# Patient Record
Sex: Female | Born: 1965
Health system: Southern US, Community
[De-identification: ages and names within clinical notes are randomized; demographics above are authoritative.]

## PROBLEM LIST (undated history)

## (undated) DIAGNOSIS — F419 Anxiety disorder, unspecified: Secondary | ICD-10-CM

## (undated) DIAGNOSIS — E0500 Thyrotoxicosis with diffuse goiter without thyrotoxic crisis or storm: Secondary | ICD-10-CM

## (undated) DIAGNOSIS — J8410 Pulmonary fibrosis, unspecified: Secondary | ICD-10-CM

## (undated) DIAGNOSIS — N921 Excessive and frequent menstruation with irregular cycle: Secondary | ICD-10-CM

## (undated) DIAGNOSIS — F32A Depression, unspecified: Secondary | ICD-10-CM

## (undated) DIAGNOSIS — M1990 Unspecified osteoarthritis, unspecified site: Secondary | ICD-10-CM

## (undated) DIAGNOSIS — G43909 Migraine, unspecified, not intractable, without status migrainosus: Secondary | ICD-10-CM

## (undated) DIAGNOSIS — J189 Pneumonia, unspecified organism: Secondary | ICD-10-CM

## (undated) DIAGNOSIS — F329 Major depressive disorder, single episode, unspecified: Secondary | ICD-10-CM

## (undated) DIAGNOSIS — Z923 Personal history of irradiation: Secondary | ICD-10-CM

## (undated) HISTORY — PX: TUBAL LIGATION: SHX77

## (undated) HISTORY — DX: Depression, unspecified: F32.A

## (undated) HISTORY — DX: Pulmonary fibrosis, unspecified: J84.10

## (undated) HISTORY — DX: Migraine, unspecified, not intractable, without status migrainosus: G43.909

## (undated) HISTORY — DX: Excessive and frequent menstruation with irregular cycle: N92.1

## (undated) HISTORY — DX: Anxiety disorder, unspecified: F41.9

## (undated) HISTORY — DX: Thyrotoxicosis with diffuse goiter without thyrotoxic crisis or storm: E05.00

## (undated) HISTORY — DX: Major depressive disorder, single episode, unspecified: F32.9

## (undated) HISTORY — PX: ABDOMINAL HYSTERECTOMY: SHX81

---

## 1991-05-02 HISTORY — PX: OTHER SURGICAL HISTORY: SHX169

## 1997-11-26 ENCOUNTER — Emergency Department (HOSPITAL_COMMUNITY): Admission: EM | Admit: 1997-11-26 | Discharge: 1997-11-26 | Payer: Self-pay | Admitting: Emergency Medicine

## 1997-12-04 ENCOUNTER — Emergency Department (HOSPITAL_COMMUNITY): Admission: EM | Admit: 1997-12-04 | Discharge: 1997-12-04 | Payer: Self-pay | Admitting: Emergency Medicine

## 1998-02-20 ENCOUNTER — Encounter: Payer: Self-pay | Admitting: Emergency Medicine

## 1998-02-20 ENCOUNTER — Emergency Department (HOSPITAL_COMMUNITY): Admission: EM | Admit: 1998-02-20 | Discharge: 1998-02-20 | Payer: Self-pay | Admitting: Emergency Medicine

## 1998-09-18 ENCOUNTER — Encounter: Payer: Self-pay | Admitting: Emergency Medicine

## 1998-09-18 ENCOUNTER — Emergency Department (HOSPITAL_COMMUNITY): Admission: EM | Admit: 1998-09-18 | Discharge: 1998-09-18 | Payer: Self-pay | Admitting: Emergency Medicine

## 1998-09-21 ENCOUNTER — Encounter: Admission: RE | Admit: 1998-09-21 | Discharge: 1998-09-21 | Payer: Self-pay | Admitting: Sports Medicine

## 1998-10-01 ENCOUNTER — Encounter: Admission: RE | Admit: 1998-10-01 | Discharge: 1998-10-01 | Payer: Self-pay | Admitting: Family Medicine

## 1999-05-10 ENCOUNTER — Emergency Department (HOSPITAL_COMMUNITY): Admission: EM | Admit: 1999-05-10 | Discharge: 1999-05-11 | Payer: Self-pay | Admitting: Emergency Medicine

## 1999-12-21 ENCOUNTER — Encounter: Admission: RE | Admit: 1999-12-21 | Discharge: 1999-12-21 | Payer: Self-pay | Admitting: Family Medicine

## 2000-08-24 ENCOUNTER — Emergency Department (HOSPITAL_COMMUNITY): Admission: EM | Admit: 2000-08-24 | Discharge: 2000-08-25 | Payer: Self-pay | Admitting: Emergency Medicine

## 2000-12-05 ENCOUNTER — Inpatient Hospital Stay (HOSPITAL_COMMUNITY): Admission: AD | Admit: 2000-12-05 | Discharge: 2000-12-05 | Payer: Self-pay | Admitting: Obstetrics

## 2001-03-21 ENCOUNTER — Encounter: Admission: RE | Admit: 2001-03-21 | Discharge: 2001-03-21 | Payer: Self-pay | Admitting: Sports Medicine

## 2001-03-21 ENCOUNTER — Other Ambulatory Visit: Admission: RE | Admit: 2001-03-21 | Discharge: 2001-03-21 | Payer: Self-pay | Admitting: Family Medicine

## 2001-10-17 ENCOUNTER — Encounter: Admission: RE | Admit: 2001-10-17 | Discharge: 2001-10-17 | Payer: Self-pay | Admitting: Family Medicine

## 2001-10-21 ENCOUNTER — Encounter: Payer: Self-pay | Admitting: Sports Medicine

## 2001-10-21 ENCOUNTER — Encounter: Admission: RE | Admit: 2001-10-21 | Discharge: 2001-10-21 | Payer: Self-pay | Admitting: Sports Medicine

## 2001-10-30 ENCOUNTER — Encounter: Admission: RE | Admit: 2001-10-30 | Discharge: 2001-10-30 | Payer: Self-pay | Admitting: Family Medicine

## 2002-04-01 ENCOUNTER — Emergency Department (HOSPITAL_COMMUNITY): Admission: EM | Admit: 2002-04-01 | Discharge: 2002-04-01 | Payer: Self-pay | Admitting: Emergency Medicine

## 2002-04-02 ENCOUNTER — Encounter: Admission: RE | Admit: 2002-04-02 | Discharge: 2002-04-02 | Payer: Self-pay | Admitting: Family Medicine

## 2002-06-30 ENCOUNTER — Emergency Department (HOSPITAL_COMMUNITY): Admission: EM | Admit: 2002-06-30 | Discharge: 2002-07-01 | Payer: Self-pay | Admitting: Emergency Medicine

## 2002-11-11 ENCOUNTER — Encounter: Admission: RE | Admit: 2002-11-11 | Discharge: 2002-11-11 | Payer: Self-pay | Admitting: Family Medicine

## 2002-11-30 ENCOUNTER — Encounter (INDEPENDENT_AMBULATORY_CARE_PROVIDER_SITE_OTHER): Payer: Self-pay | Admitting: *Deleted

## 2002-11-30 LAB — CONVERTED CEMR LAB

## 2002-12-12 ENCOUNTER — Encounter: Admission: RE | Admit: 2002-12-12 | Discharge: 2002-12-12 | Payer: Self-pay | Admitting: Family Medicine

## 2002-12-17 ENCOUNTER — Other Ambulatory Visit: Admission: RE | Admit: 2002-12-17 | Discharge: 2002-12-29 | Payer: Self-pay | Admitting: Family Medicine

## 2002-12-17 ENCOUNTER — Encounter: Admission: RE | Admit: 2002-12-17 | Discharge: 2002-12-17 | Payer: Self-pay | Admitting: Family Medicine

## 2002-12-30 ENCOUNTER — Encounter (INDEPENDENT_AMBULATORY_CARE_PROVIDER_SITE_OTHER): Payer: Self-pay | Admitting: Specialist

## 2002-12-30 ENCOUNTER — Ambulatory Visit (HOSPITAL_BASED_OUTPATIENT_CLINIC_OR_DEPARTMENT_OTHER): Admission: RE | Admit: 2002-12-30 | Discharge: 2002-12-30 | Payer: Self-pay | Admitting: Orthopedic Surgery

## 2003-01-08 ENCOUNTER — Encounter: Payer: Self-pay | Admitting: Sports Medicine

## 2003-01-08 ENCOUNTER — Encounter: Admission: RE | Admit: 2003-01-08 | Discharge: 2003-01-08 | Payer: Self-pay | Admitting: Sports Medicine

## 2003-01-27 ENCOUNTER — Encounter: Admission: RE | Admit: 2003-01-27 | Discharge: 2003-01-27 | Payer: Self-pay | Admitting: Sports Medicine

## 2003-03-18 ENCOUNTER — Encounter: Admission: RE | Admit: 2003-03-18 | Discharge: 2003-03-18 | Payer: Self-pay | Admitting: Family Medicine

## 2003-04-01 ENCOUNTER — Encounter: Admission: RE | Admit: 2003-04-01 | Discharge: 2003-04-01 | Payer: Self-pay | Admitting: Family Medicine

## 2003-04-14 ENCOUNTER — Encounter: Admission: RE | Admit: 2003-04-14 | Discharge: 2003-04-14 | Payer: Self-pay | Admitting: Family Medicine

## 2003-05-22 ENCOUNTER — Encounter: Admission: RE | Admit: 2003-05-22 | Discharge: 2003-05-22 | Payer: Self-pay | Admitting: Family Medicine

## 2003-06-02 ENCOUNTER — Encounter: Admission: RE | Admit: 2003-06-02 | Discharge: 2003-06-02 | Payer: Self-pay | Admitting: Sports Medicine

## 2003-06-09 ENCOUNTER — Encounter: Admission: RE | Admit: 2003-06-09 | Discharge: 2003-06-09 | Payer: Self-pay | Admitting: Family Medicine

## 2003-06-17 ENCOUNTER — Encounter: Admission: RE | Admit: 2003-06-17 | Discharge: 2003-06-17 | Payer: Self-pay | Admitting: Sports Medicine

## 2003-07-08 ENCOUNTER — Encounter: Admission: RE | Admit: 2003-07-08 | Discharge: 2003-07-08 | Payer: Self-pay | Admitting: Family Medicine

## 2003-08-19 ENCOUNTER — Emergency Department (HOSPITAL_COMMUNITY): Admission: EM | Admit: 2003-08-19 | Discharge: 2003-08-19 | Payer: Self-pay | Admitting: Emergency Medicine

## 2003-09-23 ENCOUNTER — Inpatient Hospital Stay (HOSPITAL_COMMUNITY): Admission: AD | Admit: 2003-09-23 | Discharge: 2003-09-23 | Payer: Self-pay | Admitting: Obstetrics and Gynecology

## 2004-08-03 ENCOUNTER — Encounter: Admission: RE | Admit: 2004-08-03 | Discharge: 2004-08-03 | Payer: Self-pay | Admitting: Sports Medicine

## 2004-08-03 ENCOUNTER — Ambulatory Visit: Payer: Self-pay | Admitting: Family Medicine

## 2004-08-11 ENCOUNTER — Ambulatory Visit: Payer: Self-pay | Admitting: Sports Medicine

## 2004-08-25 ENCOUNTER — Encounter: Admission: RE | Admit: 2004-08-25 | Discharge: 2004-11-23 | Payer: Self-pay | Admitting: Sports Medicine

## 2004-10-20 ENCOUNTER — Ambulatory Visit: Payer: Self-pay | Admitting: Family Medicine

## 2004-10-20 ENCOUNTER — Other Ambulatory Visit: Admission: RE | Admit: 2004-10-20 | Discharge: 2004-10-20 | Payer: Self-pay | Admitting: Family Medicine

## 2004-10-28 ENCOUNTER — Ambulatory Visit: Payer: Self-pay | Admitting: Family Medicine

## 2004-11-08 ENCOUNTER — Encounter: Admission: RE | Admit: 2004-11-08 | Discharge: 2004-11-08 | Payer: Self-pay | Admitting: Sports Medicine

## 2005-12-13 ENCOUNTER — Ambulatory Visit: Payer: Self-pay | Admitting: Sports Medicine

## 2006-02-01 ENCOUNTER — Emergency Department (HOSPITAL_COMMUNITY): Admission: EM | Admit: 2006-02-01 | Discharge: 2006-02-01 | Payer: Self-pay | Admitting: Emergency Medicine

## 2006-03-02 ENCOUNTER — Emergency Department (HOSPITAL_COMMUNITY): Admission: EM | Admit: 2006-03-02 | Discharge: 2006-03-02 | Payer: Self-pay | Admitting: Emergency Medicine

## 2006-06-29 ENCOUNTER — Encounter (INDEPENDENT_AMBULATORY_CARE_PROVIDER_SITE_OTHER): Payer: Self-pay | Admitting: *Deleted

## 2006-11-07 ENCOUNTER — Emergency Department (HOSPITAL_COMMUNITY): Admission: EM | Admit: 2006-11-07 | Discharge: 2006-11-07 | Payer: Self-pay | Admitting: Emergency Medicine

## 2007-02-05 ENCOUNTER — Ambulatory Visit: Payer: Self-pay | Admitting: Family Medicine

## 2007-02-05 ENCOUNTER — Encounter (INDEPENDENT_AMBULATORY_CARE_PROVIDER_SITE_OTHER): Payer: Self-pay | Admitting: Family Medicine

## 2007-02-05 DIAGNOSIS — N800 Endometriosis of uterus: Secondary | ICD-10-CM

## 2007-02-05 DIAGNOSIS — F17200 Nicotine dependence, unspecified, uncomplicated: Secondary | ICD-10-CM

## 2007-02-05 DIAGNOSIS — F411 Generalized anxiety disorder: Secondary | ICD-10-CM

## 2007-02-05 LAB — CONVERTED CEMR LAB
Chlamydia, DNA Probe: NEGATIVE
GC Probe Amp, Genital: NEGATIVE
KOH Prep: NEGATIVE

## 2007-02-06 ENCOUNTER — Encounter: Payer: Self-pay | Admitting: *Deleted

## 2007-02-11 ENCOUNTER — Ambulatory Visit: Payer: Self-pay | Admitting: Family Medicine

## 2007-02-11 ENCOUNTER — Encounter (INDEPENDENT_AMBULATORY_CARE_PROVIDER_SITE_OTHER): Payer: Self-pay | Admitting: Family Medicine

## 2007-02-11 LAB — CONVERTED CEMR LAB
Ferritin: 12 ng/mL (ref 10–291)
Folate: 16 ng/mL
HCT: 37.5 % (ref 36.0–46.0)
Hemoglobin: 12.1 g/dL (ref 12.0–15.0)
LDL Cholesterol: 76 mg/dL (ref 0–99)
MCHC: 32.3 g/dL (ref 30.0–36.0)
RDW: 14.5 % — ABNORMAL HIGH (ref 11.5–14.0)
TSH: 1.199 microintl units/mL (ref 0.350–5.50)

## 2007-02-20 ENCOUNTER — Encounter: Payer: Self-pay | Admitting: *Deleted

## 2007-02-21 ENCOUNTER — Telehealth: Payer: Self-pay | Admitting: *Deleted

## 2007-02-21 ENCOUNTER — Encounter (INDEPENDENT_AMBULATORY_CARE_PROVIDER_SITE_OTHER): Payer: Self-pay | Admitting: Family Medicine

## 2007-02-22 ENCOUNTER — Telehealth: Payer: Self-pay | Admitting: *Deleted

## 2007-03-14 ENCOUNTER — Encounter (INDEPENDENT_AMBULATORY_CARE_PROVIDER_SITE_OTHER): Payer: Self-pay | Admitting: Family Medicine

## 2007-11-04 ENCOUNTER — Ambulatory Visit: Payer: Self-pay | Admitting: Family Medicine

## 2007-11-04 ENCOUNTER — Telehealth (INDEPENDENT_AMBULATORY_CARE_PROVIDER_SITE_OTHER): Payer: Self-pay | Admitting: *Deleted

## 2008-05-12 ENCOUNTER — Ambulatory Visit: Payer: Self-pay | Admitting: Family Medicine

## 2008-05-12 DIAGNOSIS — F329 Major depressive disorder, single episode, unspecified: Secondary | ICD-10-CM | POA: Insufficient documentation

## 2008-05-20 ENCOUNTER — Telehealth: Payer: Self-pay | Admitting: *Deleted

## 2008-09-08 ENCOUNTER — Inpatient Hospital Stay (HOSPITAL_COMMUNITY): Admission: EM | Admit: 2008-09-08 | Discharge: 2008-09-10 | Payer: Self-pay | Admitting: Emergency Medicine

## 2008-09-10 ENCOUNTER — Encounter (INDEPENDENT_AMBULATORY_CARE_PROVIDER_SITE_OTHER): Payer: Self-pay | Admitting: Pediatrics

## 2008-11-12 ENCOUNTER — Ambulatory Visit: Payer: Self-pay | Admitting: Family Medicine

## 2008-11-12 ENCOUNTER — Telehealth: Payer: Self-pay | Admitting: Family Medicine

## 2008-11-12 DIAGNOSIS — M75120 Complete rotator cuff tear or rupture of unspecified shoulder, not specified as traumatic: Secondary | ICD-10-CM | POA: Insufficient documentation

## 2008-11-12 DIAGNOSIS — G43909 Migraine, unspecified, not intractable, without status migrainosus: Secondary | ICD-10-CM | POA: Insufficient documentation

## 2009-12-29 ENCOUNTER — Ambulatory Visit: Payer: Self-pay | Admitting: Family Medicine

## 2009-12-30 ENCOUNTER — Encounter: Payer: Self-pay | Admitting: Sports Medicine

## 2009-12-30 ENCOUNTER — Ambulatory Visit (HOSPITAL_COMMUNITY): Admission: RE | Admit: 2009-12-30 | Discharge: 2009-12-30 | Payer: Self-pay | Admitting: Family Medicine

## 2009-12-30 ENCOUNTER — Ambulatory Visit: Payer: Self-pay | Admitting: Family Medicine

## 2009-12-31 ENCOUNTER — Ambulatory Visit: Payer: Self-pay | Admitting: Family Medicine

## 2010-02-09 ENCOUNTER — Emergency Department (HOSPITAL_COMMUNITY)
Admission: EM | Admit: 2010-02-09 | Discharge: 2010-02-09 | Payer: Self-pay | Source: Home / Self Care | Admitting: Family Medicine

## 2010-05-15 ENCOUNTER — Emergency Department (HOSPITAL_COMMUNITY)
Admission: EM | Admit: 2010-05-15 | Discharge: 2010-05-16 | Disposition: A | Discharge status: Other Acute Inpatient Hospital | Payer: Self-pay | Source: Home / Self Care | Admitting: Emergency Medicine

## 2010-05-16 ENCOUNTER — Encounter: Payer: Self-pay | Admitting: Family Medicine

## 2010-05-16 ENCOUNTER — Observation Stay (HOSPITAL_COMMUNITY)
Admission: EM | Admit: 2010-05-16 | Discharge: 2010-05-16 | Payer: Self-pay | Attending: Family Medicine | Admitting: Family Medicine

## 2010-05-16 LAB — BLOOD GAS, VENOUS
Acid-base deficit: 14.5 mmol/L — ABNORMAL HIGH (ref 0.0–2.0)
Bicarbonate: 13.7 mEq/L — ABNORMAL LOW (ref 20.0–24.0)
O2 Saturation: 94.1 %
Patient temperature: 98.6
TCO2: 13.3 mmol/L (ref 0–100)
pCO2, Ven: 42 mmHg — ABNORMAL LOW (ref 45.0–50.0)
pH, Ven: 7.142 — CL (ref 7.250–7.300)
pO2, Ven: 91.2 mmHg — ABNORMAL HIGH (ref 30.0–45.0)

## 2010-05-16 LAB — URINALYSIS, ROUTINE W REFLEX MICROSCOPIC
Bilirubin Urine: NEGATIVE
Hgb urine dipstick: NEGATIVE
Ketones, ur: NEGATIVE mg/dL
Nitrite: NEGATIVE
Protein, ur: NEGATIVE mg/dL
Specific Gravity, Urine: 1.02 (ref 1.005–1.030)
Urine Glucose, Fasting: NEGATIVE mg/dL
Urobilinogen, UA: 0.2 mg/dL (ref 0.0–1.0)
pH: 5 (ref 5.0–8.0)

## 2010-05-16 LAB — POCT I-STAT, CHEM 8
BUN: 15 mg/dL (ref 6–23)
Calcium, Ion: 1.2 mmol/L (ref 1.12–1.32)
Chloride: 111 mEq/L (ref 96–112)
Creatinine, Ser: 0.6 mg/dL (ref 0.4–1.2)
Glucose, Bld: 96 mg/dL (ref 70–99)
HCT: 37 % (ref 36.0–46.0)
Hemoglobin: 12.6 g/dL (ref 12.0–15.0)
Potassium: 4.3 mEq/L (ref 3.5–5.1)
Sodium: 140 mEq/L (ref 135–145)
TCO2: 19 mmol/L (ref 0–100)

## 2010-05-16 LAB — DIFFERENTIAL
Basophils Absolute: 0 10*3/uL (ref 0.0–0.1)
Basophils Relative: 0 % (ref 0–1)
Eosinophils Absolute: 0.1 10*3/uL (ref 0.0–0.7)
Eosinophils Relative: 2 % (ref 0–5)
Lymphocytes Relative: 30 % (ref 12–46)
Lymphs Abs: 2 10*3/uL (ref 0.7–4.0)
Monocytes Absolute: 0.7 10*3/uL (ref 0.1–1.0)
Monocytes Relative: 10 % (ref 3–12)
Neutro Abs: 3.9 10*3/uL (ref 1.7–7.7)
Neutrophils Relative %: 58 % (ref 43–77)

## 2010-05-16 LAB — CBC
HCT: 35 % — ABNORMAL LOW (ref 36.0–46.0)
Hemoglobin: 11.5 g/dL — ABNORMAL LOW (ref 12.0–15.0)
MCH: 26.2 pg (ref 26.0–34.0)
MCHC: 32.9 g/dL (ref 30.0–36.0)
MCV: 79.7 fL (ref 78.0–100.0)
Platelets: 305 10*3/uL (ref 150–400)
RBC: 4.39 MIL/uL (ref 3.87–5.11)
RDW: 13.7 % (ref 11.5–15.5)
WBC: 6.7 10*3/uL (ref 4.0–10.5)

## 2010-05-16 LAB — LACTIC ACID, PLASMA: Lactic Acid, Venous: 2.3 mmol/L — ABNORMAL HIGH (ref 0.5–2.2)

## 2010-05-16 LAB — GLUCOSE, CAPILLARY: Glucose-Capillary: 105 mg/dL — ABNORMAL HIGH (ref 70–99)

## 2010-05-17 ENCOUNTER — Telehealth: Payer: Self-pay | Admitting: *Deleted

## 2010-05-18 LAB — HEPATIC FUNCTION PANEL
ALT: 28 U/L (ref 0–35)
AST: 23 U/L (ref 0–37)
Albumin: 3.7 g/dL (ref 3.5–5.2)
Alkaline Phosphatase: 86 U/L (ref 39–117)
Bilirubin, Direct: 0.1 mg/dL (ref 0.0–0.3)
Indirect Bilirubin: 0.2 mg/dL — ABNORMAL LOW (ref 0.3–0.9)
Total Bilirubin: 0.3 mg/dL (ref 0.3–1.2)
Total Protein: 7.5 g/dL (ref 6.0–8.3)

## 2010-05-18 LAB — CARDIAC PANEL(CRET KIN+CKTOT+MB+TROPI)
CK, MB: 1.6 ng/mL (ref 0.3–4.0)
CK, MB: 1.4 ng/mL (ref 0.3–4.0)
Relative Index: INVALID (ref 0.0–2.5)
Relative Index: INVALID (ref 0.0–2.5)
Total CK: 54 U/L (ref 7–177)
Total CK: 37 U/L (ref 7–177)
Troponin I: <0.01 ng/mL (ref 0.00–0.06)
Troponin I: <0.01 ng/mL (ref 0.00–0.06)

## 2010-05-18 LAB — DIFFERENTIAL
Basophils Absolute: 0 10*3/uL (ref 0.0–0.1)
Basophils Relative: 0 % (ref 0–1)
Eosinophils Absolute: 0.1 10*3/uL (ref 0.0–0.7)
Eosinophils Relative: 2 % (ref 0–5)
Lymphocytes Relative: 33 % (ref 12–46)
Lymphs Abs: 1.8 10*3/uL (ref 0.7–4.0)
Monocytes Absolute: 0.6 10*3/uL (ref 0.1–1.0)
Monocytes Relative: 11 % (ref 3–12)
Neutro Abs: 2.9 10*3/uL (ref 1.7–7.7)
Neutrophils Relative %: 54 % (ref 43–77)

## 2010-05-18 LAB — LIPID PANEL
Cholesterol: 95 mg/dL (ref 0–200)
HDL: 42 mg/dL (ref >39)
LDL Cholesterol: 48 mg/dL (ref 0–99)
Total CHOL/HDL Ratio: 2.3 RATIO
Triglycerides: 26 mg/dL (ref <150)
VLDL: 5 mg/dL (ref 0–40)

## 2010-05-18 LAB — BLOOD GAS, ARTERIAL
Acid-base deficit: 4.5 mmol/L — ABNORMAL HIGH (ref 0.0–2.0)
Bicarbonate: 18.3 mEq/L — ABNORMAL LOW (ref 20.0–24.0)
Drawn by: 235321
FIO2: 0.21 %
O2 Saturation: 94.6 %
Patient temperature: 98.6
TCO2: 16.8 mmol/L (ref 0–100)
pCO2 arterial: 27.1 mmHg — ABNORMAL LOW (ref 35.0–45.0)
pH, Arterial: 7.443 — ABNORMAL HIGH (ref 7.350–7.400)
pO2, Arterial: 69.9 mmHg — ABNORMAL LOW (ref 80.0–100.0)

## 2010-05-18 LAB — TSH
TSH: <0.008 u[IU]/mL — ABNORMAL LOW (ref 0.350–4.500)
TSH: <0.008 u[IU]/mL — ABNORMAL LOW (ref 0.350–4.500)

## 2010-05-18 LAB — CBC
HCT: 30.7 % — ABNORMAL LOW (ref 36.0–46.0)
Hemoglobin: 9.9 g/dL — ABNORMAL LOW (ref 12.0–15.0)
MCH: 25.4 pg — ABNORMAL LOW (ref 26.0–34.0)
MCHC: 32.2 g/dL (ref 30.0–36.0)
MCV: 78.7 fL (ref 78.0–100.0)
Platelets: 278 10*3/uL (ref 150–400)
RBC: 3.9 MIL/uL (ref 3.87–5.11)
RDW: 14.1 % (ref 11.5–15.5)
WBC: 5.3 10*3/uL (ref 4.0–10.5)

## 2010-05-18 LAB — LIPASE, BLOOD: Lipase: 30 U/L (ref 11–59)

## 2010-05-18 LAB — T4, FREE: Free T4: 2.84 ng/dL — ABNORMAL HIGH (ref 0.80–1.80)

## 2010-05-18 LAB — BASIC METABOLIC PANEL
BUN: 9 mg/dL (ref 6–23)
CO2: 23 mEq/L (ref 19–32)
Calcium: 9.4 mg/dL (ref 8.4–10.5)
Chloride: 106 mEq/L (ref 96–112)
Creatinine, Ser: 0.48 mg/dL (ref 0.4–1.2)
GFR calc Af Amer: >60 mL/min (ref >60)
GFR calc non Af Amer: >60 mL/min (ref >60)
Glucose, Bld: 126 mg/dL — ABNORMAL HIGH (ref 70–99)
Potassium: 3.7 mEq/L (ref 3.5–5.1)
Sodium: 136 mEq/L (ref 135–145)

## 2010-05-18 LAB — TROPONIN I: Troponin I: 0.01 ng/mL (ref 0.00–0.06)

## 2010-05-18 LAB — URINE CULTURE
Colony Count: NO GROWTH
Culture  Setup Time: 201201160113
Culture: NO GROWTH

## 2010-05-18 LAB — CK TOTAL AND CKMB (NOT AT ARMC)
CK, MB: 1.6 ng/mL (ref 0.3–4.0)
Relative Index: INVALID (ref 0.0–2.5)
Total CK: 63 U/L (ref 7–177)

## 2010-05-18 LAB — SEDIMENTATION RATE: Sed Rate: 20 mm/hr (ref 0–22)

## 2010-05-18 LAB — ANGIOTENSIN CONVERTING ENZYME: Angiotensin-Converting Enzyme: 57 U/L — ABNORMAL HIGH (ref 8–52)

## 2010-05-20 NOTE — Discharge Summary (Signed)
[name], [name]              ACCOUNT NO.:  [id number]  MEDICAL RECORD NO.:  [id number]          PATIENT TYPE:  INP  LOCATION:  3731                         FACILITY:  [facility]  PHYSICIAN:  Wayne A. Sheffield Slider, M.D.    DATE OF BIRTH:  01/27/1966  DATE OF ADMISSION:  05/16/2010 DATE OF DISCHARGE:  05/16/2010                              DISCHARGE SUMMARY   DISCHARGE DIAGNOSES: 1. Rib pain, musculoskeletal, noncardiac. 2. Hyperthyroidism, not otherwise specified. 3. Tobacco abuse.  DISCHARGE MEDICATIONS: 1. Tylenol 650 mg p.o. b.i.d. 2. Nicotine patch 21 mg per 24 hours transdermally daily. 3. Zofran 4 mg p.o. q.6 h. p.r.n. for nausea. 4. Propranolol 10 mg p.o. b.i.d. 5. Tramadol 50 mg p.o. q.6 h. p.r.n. for pain.  PERTINENT LABORATORY VALUES:  On May 16, 2010, cardiac panel, creatinine kinase 63, CK-MB 1.6, troponin I 0.01.  CBC with differential:  White blood cell count 5.3, hemoglobin 9.9, hematocrit30.7, platelets 278.  Lipid profile, cholesterol total 95, triglycerides 26, HDL 42, LDL 48, VLDL 5, ratio 2.3.  ESR was 20.  Angiotensin converting enzymes elevated at 57.  TSH was less than 0.008, free T4 was 2.84.  RADIOLOGY:  On May 15, 2010, a chest x-ray showed question of confluence of shadows versus infiltrate versus left upper lobe mass.  CT can be obtained for further delineation.  Tortuous aorta, mild cardiomegaly, chronic lung changes with bullae most notable in peripheral aspect of left upper lobe.   CT head showed stable examination.  No acute intracranial findings demonstrated.   CT angiogram of the chest showed no evidence of acute pulmonary embolism.  Pulmonary arterial opacification is suboptimal.  Correlation with ultrasound of lower extremity venous should be considered if there is strong clinical suspicion for thromboembolic disease.  Ill-defined anterior mediastinal mass versus spinous hyperplasia, correlate clinically.  If this is an unknown finding  clinically, followup CT in 3 months should be considered to assess stability, evidence of prior granulomatous disease with emphysema and scattered pulmonary parenchymal scarring.  On May 16, 2010, an echocardiogram showed normal systolic function with an ejection fraction of 60-65%.  No valvular disease identified.  BRIEF HOSPITAL COURSE:  [name] is a 45 year old female with a past medical history of migraine headaches, depression, anxiety, and tobacco use who presented to the emergency department complaining of 3 days of chest pain.  PROBLEMS: 1. Chest pain.  The patient's pain was noncardiac on history and     physical.  Cardiac enzymes were cycled and were negative.  The     patient was stable on telemetry overnight.  The patient did have a     3/6 systolic murmur that had been present in the past.  However, an     echocardiogram was repeated that did not show any evidence of heart     failure or valvular disease. 2. Palpitations.  The patient was tachycardic into the 110s.  On     admission, she had been complaining of 3 weeks worth of     palpitations.  TSH was found to be elevated and was found to be     low, and free  T4 was found to be elevated.  The patient was started     on propranolol at discharge for symptomatic control, and the     patient will need to follow up to be started on medication for     hyperthyroidism as an outpatient. 3. Pulmonary:  The patient with radiologic findings of chronic     emphysematous lung disease.  The patient was counseled to quit     smoking, however, there was concern that the patient may have     autoimmune cause of her lung disease such as sarcoidosis or Wegener     granulomatosis.  An angiotensin converting enzyme was elevated,     more concerning for sarcoidosis.  The patient is to follow up at     [facility] for pulmonary function testing, as well     as further workup for question of lung disease. 4. Pain.  The  patient's pain was controlled in the hospital with     scheduled Tylenol and p.r.n. tramadol.  She was discharged home     with these medications.  FOLLOWUP ISSUES/RECOMMENDATIONS:  The patient is to follow up at [facility].  She needs to be started on medication for her hyperthyroidism such as propylthiouracil or methimazole.  She should also have her TSH rechecked in 4 weeks after starting treatment to monitor disease control.  The patient also needs likely further workup for pulmonary disease that may be due to smoking versus sarcoidosis. The patient was discharged home in stable medical condition.    ______________________________ Ardyth Gal, MD   ______________________________ Arnette Norris. Sheffield Slider, M.D.    CR/MEDQ  D:  05/17/2010  T:  05/17/2010  Job:  [id number]  Electronically Signed by Ardyth Gal MD on 05/17/2010 10:20:50 PM Electronically Signed by Zachery Dauer M.D. on 05/20/2010 09:16:59 PM

## 2010-05-20 NOTE — H&P (Signed)
NAMEGEORGINA, [name] NO.:  [id number]  MEDICAL RECORD NO.:  [id number]          PATIENT TYPE:  INP  LOCATION:  3731                         FACILITY:  [facility]  PHYSICIAN:  Pearlean Brownie, M.D.DATE OF BIRTH:  1965/09/20  DATE OF ADMISSION:  05/16/2010 DATE OF DISCHARGE:  05/16/2010                             HISTORY & PHYSICAL   PRIMARY CARE PROVIDER:  Cliffton [facility] at [facility].  HISTORY OF PRESENT ILLNESS:  The patient says she has been having chest pain for 3 days.  She describes it as sharp and says that it is intermittent.  She says it hurts both when she is lying down and when she is up walking but is not worsened by walking.  She has also felt her heart racing, this has been going on for about 3 weeks.  She says she has felt this all the time for the past 3 weeks.  She also says she has been feeling more short of breath than normal for about 3 weeks.  She says it hurts to take a deep breath, it hurts to cough, hurts to push on her ribs.  The patient also reports that her daughter says she has been forgetting things lately, but the patient has not noticed this herself.  REVIEW OF SYSTEMS:  The patient denies fevers, chills, vision changes. The patient complains of diarrhea after every time she eats and heat intolerance.  MEDICATIONS:  None.  ALLERGIES:  No known drug allergies.  PAST MEDICAL HISTORY:  Significant for complicated migraine headaches, depression, anxiety, tobacco abuse, endometriosis, 2005 spirometry showed moderate obstructive pattern.  PAST SURGICAL HISTORY:  The patient had a bilateral tubal ligation in 1993.  FAMILY HISTORY:  Significant for mother who died at 14 of congestive heart failure.  The patient's father has hypertension.  The patient's brother has hypertension.  SOCIAL HISTORY:  The patient smokes about half pack a day.  She drinks alcohol.  The patient lives with her fiance.  PHYSICAL EXAMINATION:   VITAL SIGNS:  Temperature 98.6, pulse 113, respiratory rate 20, blood pressure 109/66, pulse ox 96% on room air. GENERAL:  Well developed, well nourished in no acute distress, slightly drowsy secondary to narcotics. HEAD:  Normocephalic and atraumatic without obvious abnormalities. EYES:  No corneal or conjunctival inflammation noted.  Extraocular movements intact.  Pupils are equal, round, reactive to light. MOUTH:  Oral mucosa and oropharynx without lesions or exudates.  Teeth in good repair. NECK:  No deformities, masses, or tenderness noted. CHEST:  Chest wall positive for reproducible pain under the left breast. LUNGS:  Normal respiratory effort.  Chest expands symmetrically.  Lungs are clear to auscultation.  No crackles or wheezes. HEART:  There is a 3/6 systolic murmur, tachycardia, regular rhythm. ABDOMEN:  Bowel sounds positive.  Abdomen is soft and nontender without masses, organomegaly, or hernias noted. EXTREMITIES:  Pulses, 2+ pulses in all four extremities.  No edema. NEUROLOGIC:  The patient is alert and oriented x3.  LABORATORY DATA AND STUDIES:  White blood cell count 6.7, hemoglobin 11.5, hematocrit 35.0, platelet count 305,000, normal differential. ABG, pH 7.443, pCO2 27.1, pO2  69.9, bicarb 18.3, total CO2 16.8, base deficit 4.5, oxygen saturation 94.6%.  Lactic acid is 2.3.  I-STAT total CO2 19, ionized calcium 1.2, hemoglobin 12.6, hematocrit 37.0.  Sodium 140, potassium 4.3, chloride 111, glucose 96, BUN 15, creatinine 0.6. Hepatic function panel, total bilirubin 0.3, direct bilirubin 0.1, indirect bilirubin 0.2, alkaline phosphatase 86, AST 23, ALT 28.  Total protein 7.5, albumin 3.7.  Lipase 30.  Cardiac enzymes, CK-MB is 1.6, creatine kinase 63, troponin-I less than 0.01.  RADIOLOGY:  Chest x-ray shows question of confluence of shadows versus subtle infiltrate versus left upper lobe mass.  CT can be obtained for further delineation, tortuous aorta, mild  cardiomegaly, chronic lung changes with bullae, most notable peripheral aspect left upper lobe.  CT head, stable examination, no acute intracranial findings demonstrated. A CTA chest shows no evidence of acute pulmonary embolism, ill-defined anterior mediastinal mass versus thymic hyperplasia and evidence of prior granulomatous disease with emphysema and scattered pulmonary parenchymal scarring.  ASSESSMENT AND PLAN:  This is a 45 year old female who presents with palpitations and chest pain. 1. Chest pain.  This is most likely musculoskeletal.  We will control     the pain with Tylenol and tramadol and we will cycle cardiac     enzymes to rule out for acute cardiac event.  We will monitor the     patient on telemetry and repeat an EKG in the morning.  The patient     had an echo in 2010, we will repeat this echo as her murmur is     louder than previous documentation indicates.  We will risk     stratify her cardiac disease with fasting lipid panel and     hemoglobin A1c. 2. Palpitations/tachycardia.  This is concerning for a thyroid     abnormality versus secondary to the rib pain.  We will check a TSH     and control the patient's pain. 3. Shortness of breath.  The patient with stable vital signs.  Her     shortness of breath may be secondary to the pain restricting her to     take a deep breath.  However, the patient is still smoking and has     had evidence of pulmonary disease in the past.  We will counselor     about smoking cessation. 4. Fluids, electrolytes, nutrition, gastrointestinal.  Regular diet. 5. Prophylaxis.  Heparin 5000 units subcu t.i.d. 6. Disposition is pending clinical improvement.    ______________________________ Ardyth Gal, MD   ______________________________ Pearlean Brownie, M.D.    CR/MEDQ  D:  05/16/2010  T:  05/16/2010  Job:  [id number]  Electronically Signed by Ardyth Gal MD on 05/17/2010 10:19:07 PM Electronically Signed by  Pearlean Brownie M.D. on 05/20/2010 04:54:09 PM

## 2010-05-21 ENCOUNTER — Encounter: Payer: Self-pay | Admitting: Sports Medicine

## 2010-05-24 ENCOUNTER — Ambulatory Visit: Admission: RE | Admit: 2010-05-24 | Discharge: 2010-05-24 | Payer: Self-pay | Source: Home / Self Care

## 2010-05-24 DIAGNOSIS — R0602 Shortness of breath: Secondary | ICD-10-CM | POA: Insufficient documentation

## 2010-05-31 NOTE — Letter (Signed)
Summary: Handout Printed  Printed Handout:  - Palpitations (Irregular Heart Beat)

## 2010-05-31 NOTE — Assessment & Plan Note (Signed)
Summary: remove monitor/kh    nurse visit   holter monitor removed. Theresia Lo RN  December 31, 2009 4:47 PM    Allergies: No Known Drug Allergies  Orders Added: 1)  No Charge Patient Arrived (NCPA0) [NCPA0]

## 2010-05-31 NOTE — Assessment & Plan Note (Signed)
Summary: palpatations/aches & pain on all over/headaches/bmc  pt waited over an hour to be seen and left without being seen. she was very upset.Loralee Pacas CMA  December 30, 2009 4:11 PM   Vital Signs:  Patient profile:   45 year old female Weight:      148.8 pounds Temp:     98.7 degrees F oral Pulse rate:   86 / minute Pulse rhythm:   regular BP sitting:   100 / 74  (left arm) Cuff size:   regular  Vitals Entered By: Loralee Pacas CMA (December 29, 2009 11:34 AM)  Allergies: No Known Drug Allergies   Complete Medication List: 1)  Paroxetine Hcl 30 Mg Tabs (Paroxetine hcl) .... One tablet by mouth daily 2)  Cvs Nicotine 14 Mg/24hr Pt24 (Nicotine) .... Apply once daily 3)  Aspirin 81 Mg Tbec (Aspirin) .... One tablet by mouth daily 4)  Imitrex 25 Mg Tabs (Sumatriptan succinate) .... One tablet by mouth when you feel a migraine coming on and take another tablet if no improvement after 2 hours  Other Orders: No Charge Patient Arrived (NCPA0) (NCPA0)

## 2010-05-31 NOTE — Assessment & Plan Note (Signed)
Summary: pt waited yesterday but wasn't seen/resch'd/bmc   Vital Signs:  Patient profile:   45 year old female Height:      66 inches Weight:      148 pounds BMI:     23.97 Temp:     97.8 degrees F oral Pulse rate:   88 / minute Pulse (ortho):   92 / minute BP sitting:   104 / 65  (left arm) BP standing:   109 / 77 Cuff size:   regular  Vitals Entered By: Jimmy Footman, CMA (December 30, 2009 3:43 PM)  Serial Vital Signs/Assessments:  Time      Position  BP       Pulse  Resp  Temp     By           Lying RA  103/71   96                    Jimmy Footman, CMA           Sitting   103/70   83                    Jimmy Footman, [place]           Standing  109/77   92                    Jimmy Footman, CMA  CC: heart flutter and dizziness Is Patient Diabetic? No Pain Assessment Patient in pain? yes     Location: body ache   Primary Care Provider:  . [facility]  CC:  heart flutter and dizziness.  History of Present Illness: 45 yo female hx anxiety with palpitations.  Palpitations:  Has been going on for 2 weeks now.  Come on every day, 3-4x a day, lasts a few seconds, described as racing or skipped beats, associated with subjective presyncope but pt has never passed out, no CP, does have some SOB during the episodes, does get sweaty during the episodes.  Currently on Paxil but cutting 30mg  pills in half because they are "too strong."  Drinks 2 cups of coffee every morning, drinks caffeinated soda though the day.  Just moved into a new residence with boyfriend a month ago.  Admits to being anxious.  Endorses some dizziness on standing, never passed out, described as presyncopy rather than vertiginous/room spinning.  Recent CVA workup negative with neg MRI, neg 2D ECHO, dx complicated migraines.  Pt here but was not seen yesterday.  Habits & Providers  Alcohol-Tobacco-Diet     Tobacco Status: current     Cigarette Packs/Day: 0.25  Current Medications (verified): 1)  Paroxetine Hcl 30  Mg Tabs (Paroxetine Hcl) .... One Tablet By Mouth Daily 2)  Cvs Nicotine 14 Mg/24hr Pt24 (Nicotine) .... Apply Once Daily 3)  Aspirin 81 Mg Tbec (Aspirin) .... One Tablet By Mouth Daily 4)  Imitrex 25 Mg Tabs (Sumatriptan Succinate) .... One Tablet By Mouth When You Feel A Migraine Coming On and Take Another Tablet If No Improvement After 2 Hours  Allergies (verified): No Known Drug Allergies  Review of Systems       See HPI  Physical Exam  General:  Well-developed,well-nourished,in no acute distress; alert,appropriate and cooperative throughout examination Eyes:  No corneal or conjunctival inflammation noted. EOMI. Perrla. Neck:  No deformities, masses, or tenderness noted. Lungs:  Normal respiratory effort, chest expands symmetrically. Lungs are clear to auscultation, no crackles or wheezes. Heart:  Normal rate and regular rhythm. S1 and S2 normal without gallop, murmur, click, rub or other extra sounds. Abdomen:  Bowel sounds positive,abdomen soft and non-tender without masses, organomegaly or hernias noted. Extremities:  No edema, pulses palpable x4. Psych:  moderately anxious.   Additional Exam:  12 lead ECG:  NSR, rate: 88, Normal PR, QRS, QTc.  No S-T changes.  Some early repolarization.  Normal axis.  Completely unchanged from prior ECG reviewed in [facility].   Impression & Recommendations:  Problem # 1:  PALPITATIONS (ICD-785.1) Assessment New Symptoms most likely related to panic attacks. Need to r/o cardiac etiology.   ECG neg. Orthostatic vitals neg. Needs 24h holter, applied today, will remove tomorrow at 4pm and review.  Pt will keep notes of when she feels symptoms. ECHO done already for CVA workup. Pt to cut back on coffee and other sources of caffeine.  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 Advised that can try to switch to citalopram once cardiac workup completed. Pt amenable to trying this.  Her updated medication list for this problem includes:    Paroxetine Hcl 30 Mg Tabs (Paroxetine hcl) ..... One tablet by mouth daily  Orders: [facility]- Est  Level 4 (60454)  Complete Medication List: 1)  Paroxetine Hcl 30 Mg Tabs (Paroxetine hcl) .... One tablet by mouth daily 2)  Cvs Nicotine 14 Mg/24hr Pt24 (Nicotine) .... Apply once daily 3)  Aspirin 81 Mg Tbec (Aspirin) .... One tablet by mouth daily 4)  Imitrex 25 Mg Tabs (Sumatriptan succinate) .... One tablet by mouth when you feel a migraine coming on and take another tablet if no improvement after 2 hours  Patient Instructions: 1)  Good to meet you today, 2)  Will place holter monitor today. 3)  Come back tomorrow at 4pm to have it removed. 4)  Keep a log of when you feel the palpitations and how long. 5)  I will let you know the results of the testing when they are ready. 6)  You should try to avoid caffeine in coffee and soda. 7)  -Dr. Karie Schwalbe.

## 2010-05-31 NOTE — Letter (Signed)
Summary: Handout Printed  Printed Handout:  - Holter Monitoring

## 2010-06-02 NOTE — Assessment & Plan Note (Signed)
Summary: PFTs - Rx Clinic   Vital Signs:  Patient profile:   45 year old female Height:      68.5 inches Weight:      134 pounds BMI:     20.15 Pulse rate:   95 / minute BP sitting:   110 / 77  (right arm)  Primary Care Provider:  . [facility]   History of Present Illness: Patient arrives with Koleen Nimrod (her sister) for Lung test evaluation for Shortness of Breath and Dyspnea.   Patient Complains of Itching - persistent, AND Right sided Headache.  She states the itching started with the nicotine patch use and has NOT stopped since.  Although she has stopped using the nicotine patch.   Patient reports itching with Monia Pouch provided (by Mail Order) Nicotine Patches.  Is willing to try patches again but NOT the brand she got from her mail order.  Patient has communicated her wish for a NEW patch option to a NURSE at her insurance plan.   Patient willing to purchase Nicoderm patches if her insurance company does NOT provide current supply that she can tolerate. Patient also complaining of palpitations that have not resolved since being discharged from hospital despite beta blocker initiation. She complained of wt and hair loss as well.  Habits & Providers  Alcohol-Tobacco-Diet     Tobacco Status: current     Tobacco Counseling: Tried Nictine Patches 2011 - developed itching with use of patches.  Stopped use and continued to smoke.      Year Started: 1977     Pack years: 8.75  Current Medications (verified): 1)  Propranolol Hcl 10 Mg Tabs (Propranolol Hcl) .... Two Times A Day 2)  Tramadol Hcl 50 Mg Tabs (Tramadol Hcl) .... Two Times A Day 3)  Bc Fast Pain Relief 845-65 Mg Pack (Aspirin-Caffeine) .... As Needed For Headache Pain or Back Pain  Allergies: No Known Drug Allergies   Impression & Recommendations:  Problem # 1:  DYSPNEA (ICD-786.05) Assessment Unchanged  .Spirometry evaluation with Pre and Post Bronchodilator reveals normal lung function.  Patient has been  experiencing:shortness of breath and palpitations and taking: propranolol since d/c from hospital to control palpitations. Breathing has not gotten better since d/c from hospital. Continue current treatment plan at this time.  Reviewed results of pulmonary function tests.  Pt verbalized understanding of result and states she is now even more motivated to quit smoking.   Written pt instructions provided.    Orders: PFT Baseline-Pre/Post Bronchodiolator (PFT Baseline-Pre/Pos) Albuterol Sulfate Sol 1mg  unit dose (Z6109)  Problem # 2:  TOBACCO ABUSE (ICD-305.1) Assessment: Improved  Moderate Nicotine Abuse of: 34 years duration in a patient who is: excellent candidate for success b/c of: motivation to quit. OF NOTE, she has cut down from 1 ppd to 6 cigs per day over the last week. Does not want to smoke anymore. Initiated nicotine replacement tx: patch. Patient already has supply at home, with more on the way from mail order pharmacy. The original patches she was sent from her pharmacy caused her to itch. She thinks they will be sending a different kind that her insurance still covers. If they are not, she said she will go get Nicoderm patches.  Patient counseled on purpose, proper use, and potential adverse effects. Because patient has cut down in the past week from a pack a day to 6 cigarettes/day, counseled patient she can cut the 21 mg patches in half.  Written information provided and counseling provided on avoidance  of triggers. Patient enjoys baking and will try brushing her teeth first thing in the morning to avoid getting a cigarette as soon she wakes up.: F/U Rx Clinic Visit:3 weeks.  Total time with patient in face-to-face counseling:   25 minutes.  Patient seen with:Monica Andrey Campanile, PharmD, Margot Chimes, PharmD candidate, Bernadette Hoit, MD  Her updated medication list for this problem includes:    Nicoderm Cq 21 Mg/24hr Pt24 (Nicotine) .Marland Kitchen... Apply one half patch daily.  Orders: PFT  Baseline-Pre/Post Bronchodiolator (PFT Baseline-Pre/Pos) Albuterol Sulfate Sol 1mg  unit dose (N5621)  Complete Medication List: 1)  Propranolol Hcl 10 Mg Tabs (Propranolol hcl) .... Two times a day 2)  Tramadol Hcl 50 Mg Tabs (Tramadol hcl) .... Two times a day 3)  Bc Fast Pain Relief 845-65 Mg Pack (Aspirin-caffeine) .... As needed for headache pain or back pain 4)  Nicoderm Cq 21 Mg/24hr Pt24 (Nicotine) .... Apply one half patch daily.  Patient Instructions: 1)  Results of lung function test today showed near normal lung function.  However, quitting smoking is the most important thing you can to prevent lung damage.  2)  QUIT with use of patches as soon as you get them.    3)  Folllow-UP visit with Dr Gunnar Fusi (Dr. T) next Friday February 3rd.  4)  Follow-UP visit with Pharmacist Clinic in 3 weeks.  Prescriptions: NICODERM CQ 21 MG/24HR PT24 (NICOTINE) Apply one half patch daily.  #1 x 0   Entered and Authorized by:   Christian Mate D   Signed by:   Madelon Lips Pharm D on 05/24/2010   Method used:   Historical   RxID:   [id number] BC FAST PAIN RELIEF 845-65 MG PACK (ASPIRIN-CAFFEINE) as needed for Headache Pain OR Back pain  #1 x 0   Entered and Authorized by:   Christian Mate D   Signed by:   Madelon Lips Pharm D on 05/24/2010   Method used:   Historical   RxID:   [id number] TRAMADOL HCL 50 MG TABS (TRAMADOL HCL) two times a day  #1 x 0   Entered and Authorized by:   Christian Mate D   Signed by:   Madelon Lips Pharm D on 05/24/2010   Method used:   Historical   RxID:   [id number] PROPRANOLOL HCL 10 MG TABS (PROPRANOLOL HCL) two times a day  #1 x 0   Entered and Authorized by:   Christian Mate D   Signed by:   Madelon Lips Pharm D on 05/24/2010   Method used:   Historical   RxID:   [id number]    Medication Administration  Medication # 1:    Medication: Albuterol Sulfate Sol 1mg  unit dose    Diagnosis: DYSPNEA (ICD-786.05)    Dose: 2.5  mg    Route: inhaled    Exp Date: 10/30/2011    Lot #: [id number]    Mfr: Nephron    Patient tolerated medication without complications    Given by: Garen Grams LPN (May 24, 2010 11:46 AM)  Orders Added: 1)  PFT Baseline-Pre/Post Bronchodiolator [PFT Baseline-Pre/Pos] 2)  Albuterol Sulfate Sol 1mg  unit dose [U9323]    Tobacco Counseling   Currently uses tobacco.    Cigarettes      Year started smoking cigarettes:      1977     Number of packs per day smoked:      0.25     Years smoked:  35     Packs per year:          91.25     Pack Years:            8.75  Counseled to quit/cut down on tobacco use.   Readiness to Quit:     Very Ready Cessation Stage:     Action Target Quit Date:     05/26/2010  Quitting Barriers:      -  stress     -  feels addicted  Quitting Motivators:      -  dyspnea     -  cost  Comments: Brand smoked: Misty Menthol   Nicotine Content per Cigarette (mg):  0.5-0.7  Estimated Nicotine intake per day:  4   mg.  Smokes first cigarette:  <5  minutes after waking.  Smokes:  3  times per night.   Estimated Fagerstrom Score: Patient reports IMPORTANCE  to quit on 1-10 scale of: >10  Patient reports READINESS to quit on a scale of 1-10 of: >10 (NOW)    Patient reports CONFIDENCE in potential to quit on scale of 1-10 of:      Previous Quit Attempts   Previously Tried to quit:     Yes # of Previous quit attempts:     >10 Longest Successful Quit Period:   6 months  Quit Methods Tried:      -  cold [place]     -  nicotine patch     -  nicotine gum  Comments: Tried Nictine Patches 2011 - developed itching with use of patches.  Stopped use and continued to smoke.   Smoking Cessation Plan   Readiness:     Very Ready Cessation Stage:   Action Target Quit Date:   05/26/2010 New Medications: PROPRANOLOL HCL 10 MG TABS (PROPRANOLOL HCL) two times a day TRAMADOL HCL 50 MG TABS (TRAMADOL HCL) two times a day BC FAST PAIN RELIEF 845-65 MG PACK  (ASPIRIN-CAFFEINE) as needed for Headache Pain OR Back pain NICODERM CQ 21 MG/24HR PT24 (NICOTINE) Apply one half patch daily.  New Problems: DYSPNEA (ICD-786.05)  Medications Added this Update:  PROPRANOLOL HCL 10 MG TABS (PROPRANOLOL HCL) two times a day TRAMADOL HCL 50 MG TABS (TRAMADOL HCL) two times a day BC FAST PAIN RELIEF 845-65 MG PACK (ASPIRIN-CAFFEINE) as needed for Headache Pain OR Back pain NICODERM CQ 21 MG/24HR PT24 (NICOTINE) Apply one half patch daily.  Orders Added this Update:  PFT Baseline-Pre/Post Bronchodiolator [PFT Baseline-Pre/Pos] Albuterol Sulfate Sol 1mg  unit dose [Z6109]    Pulmonary Function Test Date: 05/24/2010 Height (in.): 68.5 Gender: Female  Pre-Spirometry FVC    Value: 2.73 L/min   Pred: 3.15 L/min     % Pred: 86 % FEV1    Value: 2.16 L     Pred: 2.59 L     % Pred: 83 % FEV1/FVC  Value: 79 %     Pred: 83 %     % Pred: 95 % FEF 25-75  Value: 1.92 L/min   Pred: 2.86 L/min     % Pred: 67 %  Post-Spirometry FVC    Value: 2.98 L/min   Pred: 3.15 L/min     % Pred: 94 % FEV1    Value: 2.39 L     Pred: 2.59 L     % Pred: 92 % FEV1/FVC  Value: 80 %     Pred: 83 %     % Pred: 96 % FEF 25-75  Value: 2.44 L/min   Pred: 2.86 L/min     % Pred: 85 %  Comments: Effort:  Good  Evaluation: near normal - No significant bronchodilator response    Medication Administration  Medication # 1:    Medication: Albuterol Sulfate Sol 1mg  unit dose    Diagnosis: DYSPNEA (ICD-786.05)    Dose: 2.5 mg    Route: inhaled    Exp Date: 10/30/2011    Lot #: [id number]    Mfr: Nephron    Patient tolerated medication without complications    Given by: Garen Grams LPN (May 24, 2010 11:46 AM)  Orders Added: 1)  PFT Baseline-Pre/Post Bronchodiolator [PFT Baseline-Pre/Pos] 2)  Albuterol Sulfate Sol 1mg  unit dose [E4540]

## 2010-06-02 NOTE — Progress Notes (Signed)
Summary: Triage call   Phone Note Other Incoming   Caller: [employer] RN Case Manager Summary of Call: Received a call from the patient's insurance RN Case Manger.  Patient discharged from hospital today and during the post disharge phone call patient told her she was experiencing left sided chest pain, SOB and nausea.  Was advised by the CM to call 911 but patient refused.    Pt also told her that she did not get the Zofran filled d/t it not being covered by insurance.  While on the phone with the patient her boyfriend arrived at the house.  Asked her if she spoke with the boyfriend about the situation and she did not, instead she called [facility].  I attemped to call the patient back but did not get a response.  Left a message and told her I would attempt to call again in a few minutes and advised her to call the office if she needed to be seen.  I did review the hospital H&P.  Patient was admitted with CP and worked up for AMI which was ruled out.  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  Will route this note to the managing resident as an fyi. Initial call taken by: Dennison Nancy RN,  May 17, 2010 11:33 AM  Follow-up for Phone Call        Made another phone call attempt w/o success.  No answer. Follow-up by: Dennison Nancy RN,  May 17, 2010 12:17 PM     Appended Document: Triage call appt made for PFT 1/25 & f/u w/  Dr T 2/3

## 2010-06-03 ENCOUNTER — Other Ambulatory Visit: Payer: Self-pay | Admitting: Sports Medicine

## 2010-06-03 ENCOUNTER — Ambulatory Visit (INDEPENDENT_AMBULATORY_CARE_PROVIDER_SITE_OTHER): Payer: Medicare HMO | Admitting: Sports Medicine

## 2010-06-03 ENCOUNTER — Encounter: Payer: Self-pay | Admitting: Sports Medicine

## 2010-06-03 ENCOUNTER — Ambulatory Visit: Admit: 2010-06-03 | Payer: Self-pay

## 2010-06-03 DIAGNOSIS — E0590 Thyrotoxicosis, unspecified without thyrotoxic crisis or storm: Secondary | ICD-10-CM

## 2010-06-03 DIAGNOSIS — E0500 Thyrotoxicosis with diffuse goiter without thyrotoxic crisis or storm: Secondary | ICD-10-CM | POA: Insufficient documentation

## 2010-06-03 DIAGNOSIS — D869 Sarcoidosis, unspecified: Secondary | ICD-10-CM

## 2010-06-03 LAB — TSH: TSH: 0.008 u[IU]/mL — ABNORMAL LOW (ref 0.350–4.500)

## 2010-06-03 LAB — T4, FREE: Free T4: 3.59 ng/dL — ABNORMAL HIGH (ref 0.80–1.80)

## 2010-06-03 LAB — T3, FREE: T3, Free: 14.9 pg/mL — ABNORMAL HIGH (ref 2.3–4.2)

## 2010-06-04 LAB — RHEUMATOID FACTOR: Rheumatoid fact SerPl-aCnc: <10 [IU]/mL (ref <=14)

## 2010-06-06 LAB — ANA: Anti Nuclear Antibody(ANA): NEGATIVE

## 2010-06-06 LAB — THYROGLOBULIN ANTIBODY
Thyroglobulin Ab: <20 U/mL (ref <40.0)
Thyroglobulin: 89.6 ng/mL — ABNORMAL HIGH (ref 0.0–55.0)
Thyroperoxidase Ab SerPl-aCnc: 672 [IU]/mL — ABNORMAL HIGH (ref <35.0)

## 2010-06-07 ENCOUNTER — Institutional Professional Consult (permissible substitution): Payer: Medicare HMO | Admitting: Internal Medicine

## 2010-06-07 LAB — TRICYCLIC ANTIDEPRESSANT EVALUATION
Amitriptyline and Nortriptyline: <5 ug/L — ABNORMAL LOW (ref 120–250)
Amitriptyline: <5 ug/L
Clomipramine.: <5 ng/mL
Desemethylclomipramine: <5 ng/mL
Desipramine DPRAM: <5 mcg/L
Desmethyldoxepin: <5 ug/L
Doxepin: <5 mcg/L
Imipramine IPRAM: <5 ug/L
Imipramine and Desipramine: <5 mcg/L — ABNORMAL LOW (ref 150–300)
Nortriptyline NTRIP: <5 mcg/L
Total Desmethyldoxepine+Doxepin: <5 mcg/L — ABNORMAL LOW (ref 100–250)

## 2010-06-08 NOTE — Assessment & Plan Note (Signed)
Summary: f/u,df   Vital Signs:  Patient profile:   45 year old female Height:      68.5 inches Weight:      137 pounds Temp:     98.4 degrees F oral Pulse rate:   102 / minute Pulse rhythm:   regular BP sitting:   108 / 75  (right arm) Cuff size:   regular  Vitals Entered By: Loralee Pacas CMA (June 03, 2010 2:05 PM) CC: hosp follow up Is Patient Diabetic? No Pain Assessment Patient in pain? no        Primary Care Provider:  . [facility]  CC:  hosp follow up.  History of Present Illness: 45 yo female with palpitations.  Palpitations:  Saw me in september.  Holter monitor done and showed gradual onset/offset sinus tachycardia max rate 156.  Then she was admitted for CP r/o. She was sinus tachy on tele, ECHO unremarkable, CE neg x3.  Noted TSH very low and T3/T4 elevated.  Discharged with propranolol 10 BID and further w/u deferred to the outpt setting appropriately.  SOB:  Chronic, CTA with signs of old granulomatous disease.  ACE levels high.  Pt does have a famhx Lupus and is having some joint pains.  Her ESR was normal.  PFTs recently were normal.  Habits & Providers  Alcohol-Tobacco-Diet     Tobacco Status: current     Tobacco Counseling: Tried Nictine Patches 2011 - developed itching with use of patches.  Stopped use and continued to smoke.      Cigarette Packs/Day: 0.25     Year Started: 1977     Pack years: 8.75     Passive Smoke Exposure: no  Exercise-Depression-Behavior     Have you felt down or hopeless? no     Have you felt little pleasure in things? no     Depression Counseling: not indicated; screening negative for depression     Seat Belt Use: always  Current Medications (verified): 1)  Propranolol Hcl 60 Mg Tabs (Propranolol Hcl) .... One-Half Tab Tab By Mouth Two Times A Day For 3d, Then One Tab By Mouth Two Times A Day. 2)  Tramadol Hcl 50 Mg Tabs (Tramadol Hcl) .... Two Times A Day 3)  Bc Fast Pain Relief 845-65 Mg Pack (Aspirin-Caffeine)  .... As Needed For Headache Pain or Back Pain 4)  Nicoderm Cq 21 Mg/24hr Pt24 (Nicotine) .... Apply One Half Patch Daily.  Allergies (verified): No Known Drug Allergies  Past History:  Past Surgical History: Last updated: 05/12/2008 BTL - 05/02/1991 Removal  thumb nail (fungal infx) - 01/19/2003  Past Medical History: [id number]  NSVD x 3 Menometroraghia Recurrent depression Anxiety Migraines- complicated (w/ neurological manifestations)...hospitalized 08/2008 neg w/u Spirometry:  moderate obstructive pattern - 06/15/2003 Spirometry:  Normal - 05/24/10 Skin testing:  multiple allergies - 06/15/2003 CTA: Evidence of old granulomatous disease 05/2010. ACE levels: high Hyperthyroidism - Graves w/u pending.  Social History: Risk analyst Use:  always  Review of Systems       See HPI  Physical Exam  General:  Well-developed,well-nourished,in no acute distress; alert,appropriate and cooperative throughout examination Eyes:  Mild proptosis, no lid lag noted. Neck:  Thyroid fullness/mild goiter. Lungs:  Normal respiratory effort, chest expands symmetrically. Lungs are clear to auscultation, no crackles or wheezes. Heart:  Normal rate and regular rhythm. S1 and S2 normal without gallop, murmur, click, rub or other extra sounds. Extremities:  No edema.   Impression & Recommendations:  Problem # 1:  HYPERTHYROIDISM (ICD-242.90) Assessment [facility] labs suspicious. Will repeat TSH/T3/T4. Also checking Anti-thyroperoxidase Ab, Thyroid receptor stimulating antibody, anti-TBG antibody. Radioactive iodine uptake scan. Increasing propranolol to 30 two times a day for 3d, then 60 by mouth two times a day for goal HR 60-70. If workup suspicious for Graves Disease, would refer to endocrinology for anti-thyroid therapy. If hot nodule seen, would need biopsy. RTC 1 week to reasses symptoms and go over results.  Her updated medication list for this problem includes:    Propranolol Hcl 60 Mg  Tabs (Propranolol hcl) ..... One-half tab tab by mouth two times a day for 3d, then one tab by mouth two times a day.  Orders: TSH-[facility] [phone]) Free T3-[facility] [phone]) Free T4-[facility] [phone]) Miscellaneous Lab Charge-[facility] [phone]) [facility]- Est  Level 4 (96295) Nuclear Medicine (Nuclear Medicine)  Problem # 2:  ? 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 Will refer to Pulmonology for assistance in management. They will have access to CT results as well as ACE levels. Will forward further autoimmune workup to pulmonology as they become available. Checking ANA, RF.  Orders: ANA-[facility] ([id number]) Rheum Fact-[facility] (02725) [facility]- Est  Level 4 (36644) Pulmonary Referral (Pulmonary)  Problem # 3:  DYSPNEA (ICD-786.05) Assessment: Unchanged See #2.  Complete Medication List: 1)  Propranolol Hcl 60 Mg Tabs (Propranolol hcl) .... One-half tab tab by mouth two times a day for 3d, then one tab by mouth two times a day. 2)  Tramadol Hcl 50 Mg Tabs (Tramadol hcl) .... Two times a day 3)  Bc Fast Pain Relief 845-65 Mg Pack (Aspirin-caffeine) .... As needed for headache pain or back pain 4)  Nicoderm Cq 21 Mg/24hr Pt24 (Nicotine) .... Apply one half patch daily.  Patient Instructions: 1)  Referral to lung doctor. 2)  Checking bloodwork. 3)  Scan of your thyroid. 4)  Come back to see me in a week to go over results. 5)  May double book appt slots if needed. 6)  Increase your propranolol as directed below. 7)  -Dr. Karie Schwalbe. Prescriptions: PROPRANOLOL HCL 60 MG TABS (PROPRANOLOL HCL) One-half tab tab by mouth two times a day for 3d, then one tab by mouth two times a day.  #60 x 3   Entered and Authorized by:   Rodney Langton MD   Signed by:   Rodney Langton MD on 06/03/2010   Method used:   Electronically to        [facility]. [phone]* (retail)       [address], [place]  [address]       Ph: [phone] or  [phone]       Fax: [phone]   RxID:   [id number]    Orders Added: 1)  ANA-[facility] [[id number]] 2)  TSH-[facility] [[id number]] 3)  Free T3-[facility] [[id number]] 4)  Free T4-[facility] [[id number]] 5)  Rheum Fact-[facility] [82993] 6)  Miscellaneous Lab Charge-[facility] [99999] 7)  [facility]- Est  Level 4 [71696] 8)  Nuclear Medicine [Nuclear Medicine] 9)  Pulmonary Referral [Pulmonary]

## 2010-06-08 NOTE — Letter (Signed)
Summary: Handout Printed  Printed Handout:  - Hyperthyroidism 

## 2010-06-10 ENCOUNTER — Ambulatory Visit (INDEPENDENT_AMBULATORY_CARE_PROVIDER_SITE_OTHER): Payer: Medicare HMO | Admitting: Sports Medicine

## 2010-06-10 ENCOUNTER — Encounter: Payer: Self-pay | Admitting: Sports Medicine

## 2010-06-10 VITALS — BP 125/82 | HR 95 | Temp 98.2°F | Ht 68.0 in | Wt 138.0 lb

## 2010-06-10 DIAGNOSIS — E0590 Thyrotoxicosis, unspecified without thyrotoxic crisis or storm: Secondary | ICD-10-CM

## 2010-06-10 DIAGNOSIS — E0500 Thyrotoxicosis with diffuse goiter without thyrotoxic crisis or storm: Secondary | ICD-10-CM

## 2010-06-10 DIAGNOSIS — R0602 Shortness of breath: Secondary | ICD-10-CM

## 2010-06-10 NOTE — Progress Notes (Signed)
  Subjective:    Patient ID: [name], female    DOB: 1966-03-24, 45 y.o.   MRN: [id number]  HPI Pt comes in to fu labs.    Hyperthyroidism:  She had hx tachycardia, dx hyperthyroidism.  TPO antibody high suggesting graves disease.  Still awaiting Thyroid uptake scan (scheduled 06/27/10).  Her propranolol was increase to 60mg  BID last visit, no longer tachy but symptoms overall still present.  No presyncope/weakness.  Also awaiting thyroid stimulating receptor antibodies.  Granulomatous lung disease:  With a high ACE level in the hospital and CT suggesting granulomatous lung disease.  Pt has appt with Dr. Sherene Sires at [facility] 06/23/10.     Review of Systems    Neg except as in HPI. Objective:   Physical Exam  Constitutional: She appears well-developed and well-nourished. No distress.  Cardiovascular: Normal rate and regular rhythm.  Exam reveals no gallop and no friction rub.   No murmur heard. Pulmonary/Chest: Effort normal and breath sounds normal. She has no wheezes. She has no rales.          Assessment & Plan:

## 2010-06-10 NOTE — Assessment & Plan Note (Signed)
Some serologies still pending however most are suggestive of Graves. Referral to adult endocrinology for assistance in further management and methimazole vs RAI ablation.  I briefly discussed the two treatments and the patient seems to be leaning towards methimazole therapy. Increased propranolol to 120mg  BID, goal HR 60's.  She is to check her BP and pulse on her home BP machine and call the results to me in a week, I can then augment her propranolol dose.

## 2010-06-10 NOTE — Assessment & Plan Note (Signed)
High ACE levels and CT c/w granulomatous disease suggestive of sarcoidosis. ANA/RF negative. Referral to Pulmonology with appt on 06/23/10.

## 2010-06-10 NOTE — Patient Instructions (Signed)
Great to see you,  Increase your propranolol to 2 tabs 2x a day, check  Your BP and pulse in a week and call the office to let us know.  Keep your appts with the Pulmonary doctor, Thyroid scan, and we are setting you up with an endocrine doctor to help with treating your thyroid.  Come back to see me after you have seen the endocrine doctor.  -Dr. Karie Schwalbe.

## 2010-06-11 LAB — THYROID STIMULATING IMMUNOGLOBULIN: TSI: 319 % baseline — ABNORMAL HIGH (ref <140)

## 2010-06-13 ENCOUNTER — Telehealth: Payer: Self-pay | Admitting: *Deleted

## 2010-06-13 NOTE — Telephone Encounter (Signed)
Spoke with Toya @ [facility] and set up Endocrinologist referral w/ Dr. Everardo All. 06/21/2010 @ 3:30pm. Pt to call at least 24 hours in advance if not able to keep appt or will be charged $50. Arrive 15 minutes prior to appt to fill out NP paperwork. Informed that nothing was needing to be faxed over due to [facility] being on Epic also. Quenten Raven checked and confirmed this while I was on the phone

## 2010-06-13 NOTE — Telephone Encounter (Signed)
LVM for pt to call back to inform of appt with endocrinologist. Dr. Everardo All w/ [facility]. [address] 06/21/2010 @ 3:30pm. Pt to call if she can not make appt at least 24 hours in advance or she will be billed $50. [phone]

## 2010-06-16 NOTE — Consult Note (Signed)
Summary: Holter Monitor Report  Heart Monitor   Imported By: De Nurse 06/07/2010 10:41:54  _____________________________________________________________________  External Attachment:    Type:   Image     Comment:   External Document

## 2010-06-17 ENCOUNTER — Encounter: Payer: Self-pay | Admitting: *Deleted

## 2010-06-20 ENCOUNTER — Ambulatory Visit: Payer: Self-pay | Admitting: Pharmacist

## 2010-06-21 ENCOUNTER — Ambulatory Visit: Payer: Medicare HMO | Admitting: Endocrinology

## 2010-06-23 ENCOUNTER — Encounter: Payer: Self-pay | Admitting: Internal Medicine

## 2010-06-23 ENCOUNTER — Institutional Professional Consult (permissible substitution) (INDEPENDENT_AMBULATORY_CARE_PROVIDER_SITE_OTHER): Payer: Managed Care, Other (non HMO) | Admitting: Internal Medicine

## 2010-06-23 DIAGNOSIS — R0602 Shortness of breath: Secondary | ICD-10-CM

## 2010-06-23 DIAGNOSIS — R911 Solitary pulmonary nodule: Secondary | ICD-10-CM

## 2010-06-23 DIAGNOSIS — R930 Abnormal findings on diagnostic imaging of skull and head, not elsewhere classified: Secondary | ICD-10-CM | POA: Insufficient documentation

## 2010-06-27 ENCOUNTER — Ambulatory Visit (HOSPITAL_COMMUNITY)
Admission: RE | Admit: 2010-06-27 | Discharge: 2010-06-27 | Disposition: A | Discharge status: Home / Self Care | Payer: Managed Care, Other (non HMO) | Source: Ambulatory Visit | Attending: *Deleted | Admitting: *Deleted

## 2010-06-27 DIAGNOSIS — E0590 Thyrotoxicosis, unspecified without thyrotoxic crisis or storm: Secondary | ICD-10-CM

## 2010-06-28 ENCOUNTER — Ambulatory Visit (HOSPITAL_COMMUNITY)
Admission: RE | Admit: 2010-06-28 | Discharge: 2010-06-28 | Disposition: A | Discharge status: Home / Self Care | Payer: Managed Care, Other (non HMO) | Source: Ambulatory Visit | Attending: Sports Medicine | Admitting: Sports Medicine

## 2010-06-28 DIAGNOSIS — E0590 Thyrotoxicosis, unspecified without thyrotoxic crisis or storm: Secondary | ICD-10-CM | POA: Insufficient documentation

## 2010-06-28 MED ORDER — SODIUM IODIDE I 131 CAPSULE: 9.9000 | Freq: Once | INTRAVENOUS | Status: AC | PRN

## 2010-06-28 MED ORDER — SODIUM PERTECHNETATE TC 99M INJECTION
10.0000 | Freq: Once | INTRAVENOUS | Status: AC | PRN
  Administered 2010-06-28: 10 via INTRAVENOUS
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
## 2010-07-20 ENCOUNTER — Ambulatory Visit (INDEPENDENT_AMBULATORY_CARE_PROVIDER_SITE_OTHER): Payer: Managed Care, Other (non HMO) | Admitting: Sports Medicine

## 2010-07-20 ENCOUNTER — Encounter: Payer: Self-pay | Admitting: Sports Medicine

## 2010-07-20 VITALS — BP 127/81 | HR 104 | Temp 98.7°F | Ht 68.0 in | Wt 136.4 lb

## 2010-07-20 DIAGNOSIS — R0602 Shortness of breath: Secondary | ICD-10-CM

## 2010-07-20 DIAGNOSIS — E0500 Thyrotoxicosis with diffuse goiter without thyrotoxic crisis or storm: Secondary | ICD-10-CM

## 2010-07-20 MED ORDER — PROPRANOLOL HCL 160 MG PO CP24: ORAL_CAPSULE | ORAL | Status: DC

## 2010-07-20 MED ORDER — HYDROXYZINE PAMOATE 100 MG PO CAPS: ORAL_CAPSULE | ORAL | Status: DC

## 2010-07-20 MED ORDER — SENNOSIDES-DOCUSATE SODIUM 8.6-50 MG PO TABS
2.0000 | ORAL_TABLET | Freq: Every day | ORAL | Status: DC
Start: 2010-07-20 — End: 2011-05-12

## 2010-07-20 NOTE — Assessment & Plan Note (Signed)
Further w/u of granulomatous disease will proceed after Graves Disease treated.

## 2010-07-20 NOTE — Assessment & Plan Note (Signed)
Referral to endocrinology. Increasing propranolol. Senokot S for constipation. Hydroxyzine for sleep.

## 2010-07-20 NOTE — Patient Instructions (Signed)
Increased your propranolol to 160mg  2 tabs daily. (320 mg total) See below for info on Graves disease. Referral to endocrinology. -Dr. Karie Schwalbe.

## 2010-07-20 NOTE — Progress Notes (Signed)
  Subjective:    Patient ID: [name], female    DOB: 07/07/1965, 45 y.o.   MRN: [id number]  HPI Granulomatous disease:  S/p pulm visit, plans are to f/u in 6 months after Graves Disease is treated.  Hypothyroid: Missed appt with endocrine once.  Still with tremors, anxiety, and tachy cardia.  Taking propranolol per pt.  Some constipation and insomnia.   Review of Systems    See HPI Objective:   Physical Exam  Constitutional: She appears well-developed and well-nourished. No distress.  Cardiovascular: Regular rhythm, normal heart sounds and intact distal pulses.  Exam reveals no gallop and no friction rub.   No murmur heard. Pulmonary/Chest: Breath sounds normal. No respiratory distress. She has no wheezes. She has no rales. She exhibits no tenderness.  Skin: Skin is warm and dry.          Assessment & Plan:

## 2010-07-25 ENCOUNTER — Telehealth: Payer: Self-pay | Admitting: Family Medicine

## 2010-07-25 ENCOUNTER — Telehealth: Payer: Self-pay | Admitting: *Deleted

## 2010-07-25 NOTE — Telephone Encounter (Signed)
Pt is wanting to know when her appt is for endocrinology.

## 2010-07-25 NOTE — Telephone Encounter (Signed)
Spoke with patient about Endocrinologist appointment. Explained to her that because she has no showed her last appointment there she will have to call them and set up another new patient appointment. Patient was very angry and stated that "If I could have made that appointment it would not be a no show" gave her the number to [facility] so she could set up appt.

## 2010-07-25 NOTE — Telephone Encounter (Signed)
Spoke with patient about Endocrinologist appointment. Explained to her that because she has no showed her last appointment there she will have to call them and set up another new patient appointment. Patient was very angry and stated that "If I could have made that appointment it would not be a no show" gave her the number to [facility] so she could set up appt.   

## 2010-07-26 ENCOUNTER — Telehealth: Payer: Self-pay | Admitting: Family Medicine

## 2010-07-26 NOTE — Telephone Encounter (Signed)
Pt calling back about her referral appt, doesn't know how to schedule it or what to say, asking to speak with RN.

## 2010-07-26 NOTE — Telephone Encounter (Signed)
Spoke with pt, reiterated importance of scheduling appt. She will call and make appt with Dr. Everardo All who appears the be the only endocrinologist at [facility]. We will make sure records are faxed.

## 2010-07-26 NOTE — Telephone Encounter (Signed)
Faxed over last 2 office visits and labs to Dr. Everardo All [phone]

## 2010-07-28 NOTE — Letter (Signed)
Summary: Handout Printed  Printed Handout:  - Hyperthyroidism 

## 2010-07-28 NOTE — Letter (Signed)
Summary: Handout Printed  Printed Handout:  - Hyperthyroidism

## 2010-08-01 ENCOUNTER — Encounter: Payer: Self-pay | Admitting: Internal Medicine

## 2010-08-04 ENCOUNTER — Encounter: Payer: Self-pay | Admitting: Internal Medicine

## 2010-08-04 ENCOUNTER — Ambulatory Visit (INDEPENDENT_AMBULATORY_CARE_PROVIDER_SITE_OTHER): Payer: Managed Care, Other (non HMO) | Admitting: Internal Medicine

## 2010-08-04 VITALS — BP 100/80 | HR 75 | Temp 98.9°F | Ht 68.0 in | Wt 133.0 lb

## 2010-08-04 DIAGNOSIS — J4489 Other specified chronic obstructive pulmonary disease: Secondary | ICD-10-CM

## 2010-08-04 DIAGNOSIS — R0602 Shortness of breath: Secondary | ICD-10-CM

## 2010-08-04 DIAGNOSIS — F17200 Nicotine dependence, unspecified, uncomplicated: Secondary | ICD-10-CM

## 2010-08-04 DIAGNOSIS — J449 Chronic obstructive pulmonary disease, unspecified: Secondary | ICD-10-CM

## 2010-08-04 DIAGNOSIS — R930 Abnormal findings on diagnostic imaging of skull and head, not elsewhere classified: Secondary | ICD-10-CM

## 2010-08-04 DIAGNOSIS — E0500 Thyrotoxicosis with diffuse goiter without thyrotoxic crisis or storm: Secondary | ICD-10-CM

## 2010-08-04 LAB — PULMONARY FUNCTION TEST

## 2010-08-04 MED ORDER — METOPROLOL TARTRATE 50 MG PO TABS: 50.0000 mg | ORAL_TABLET | Freq: Two times a day (BID) | ORAL | Status: DC

## 2010-08-04 MED ORDER — ALPRAZOLAM 0.5 MG PO TABS: 0.5000 mg | ORAL_TABLET | Freq: Three times a day (TID) | ORAL | Status: AC | PRN

## 2010-08-04 NOTE — Progress Notes (Signed)
PFT done today. 

## 2010-08-04 NOTE — Patient Instructions (Addendum)
You have only mild airflow obstruction which will not worsen over time unless you continue to smoke  Stop propranolol which may make your breathing  Lopressor 50 mg twice daily should control your hear rate without effecting your breathing  Alprazolam 0.5 mg every 6 hours if needed for nerves  Please see patient coordinator before you leave today  to schedule for endocrinology eval by Dr Everardo All for Grave's dz   If you are satisfied with your treatment plan let your doctor know and he/she can either refill your medications or you can return here when your prescription runs out.     If in any way you are not 100% satisfied,  please tell us.  If 100% better, tell your friends!
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
## 2010-08-05 ENCOUNTER — Encounter: Payer: Self-pay | Admitting: Internal Medicine

## 2010-08-05 NOTE — Assessment & Plan Note (Signed)
Not clearly related to airflow obstruction though the variability is very suggestive and she is on very high doses of propranolol. She may get benefit from a more selective BB > try lopressor

## 2010-08-06 DIAGNOSIS — J449 Chronic obstructive pulmonary disease, unspecified: Secondary | ICD-10-CM | POA: Insufficient documentation

## 2010-08-06 NOTE — Assessment & Plan Note (Signed)
Relatively well compensated albeit on high doses of inderal,  Which may be affecting the airways so try change to lopressor

## 2010-08-06 NOTE — Assessment & Plan Note (Signed)
I emphasized that although we never turn away smokers from the pulmonary clinic, we do ask that they understand that the recommendations that we make  won't work nearly as well in the presence of continued cigarette exposure.  In fact, we may very well  reach a point where we can't promise to help the patient if he/she can't quit smoking. (We can and will promise to try to help, we just can't promise what we recommend will really work)  

## 2010-08-06 NOTE — Assessment & Plan Note (Signed)
Needs f/u scan scheduled but would like to wait until rx hyperthyroid since ideally needs to be contrasted Place in tickle file for recall in 3 months

## 2010-08-06 NOTE — Assessment & Plan Note (Signed)
Only mild airflow obstruction so at this point emphasis should be on preserving lung function, not treating symptoms.    I took an extended  opportunity with this patient to outline the consequences of continued cigarette use  in airway disorders based on all the data we have from the multiple national lung health studies (perfomed over decades at millions of dollars in cost)  indicating that smoking cessation, not choice of inhalers or physicians, is the most important aspect of care.

## 2010-08-09 LAB — TROPONIN I: Troponin I: 0.01 ng/mL (ref 0.00–0.06)

## 2010-08-09 LAB — GLUCOSE, CAPILLARY
Glucose-Capillary: 101 mg/dL — ABNORMAL HIGH (ref 70–99)
Glucose-Capillary: 95 mg/dL (ref 70–99)

## 2010-08-09 LAB — URINALYSIS, MICROSCOPIC ONLY
Glucose, UA: NEGATIVE mg/dL
Ketones, ur: NEGATIVE mg/dL
Leukocytes, UA: NEGATIVE
pH: 7 (ref 5.0–8.0)

## 2010-08-09 LAB — COMPREHENSIVE METABOLIC PANEL
Albumin: 3.2 g/dL — ABNORMAL LOW (ref 3.5–5.2)
BUN: 8 mg/dL (ref 6–23)
Calcium: 9.2 mg/dL (ref 8.4–10.5)
Creatinine, Ser: 0.71 mg/dL (ref 0.4–1.2)
Total Bilirubin: 0.3 mg/dL (ref 0.3–1.2)
Total Protein: 6.8 g/dL (ref 6.0–8.3)

## 2010-08-09 LAB — HEMOGLOBIN A1C
Hgb A1c MFr Bld: 5.6 % (ref 4.6–6.1)
Mean Plasma Glucose: 114 mg/dL

## 2010-08-09 LAB — CBC
Platelets: 246 10*3/uL (ref 150–400)
RDW: 13.7 % (ref 11.5–15.5)

## 2010-08-09 LAB — DIFFERENTIAL
Basophils Absolute: 0 10*3/uL (ref 0.0–0.1)
Lymphocytes Relative: 38 % (ref 12–46)
Monocytes Absolute: 0.5 10*3/uL (ref 0.1–1.0)
Monocytes Relative: 14 % — ABNORMAL HIGH (ref 3–12)
Neutro Abs: 1.6 10*3/uL — ABNORMAL LOW (ref 1.7–7.7)

## 2010-08-09 LAB — CK TOTAL AND CKMB (NOT AT ARMC)
CK, MB: 1 ng/mL (ref 0.3–4.0)
Relative Index: INVALID (ref 0.0–2.5)
Total CK: 60 U/L (ref 7–177)

## 2010-08-09 LAB — APTT: aPTT: 28 seconds (ref 24–37)

## 2010-08-09 LAB — HOMOCYSTEINE: Homocysteine: 5.2 umol/L (ref 4.0–15.4)

## 2010-08-13 ENCOUNTER — Telehealth: Payer: Self-pay | Admitting: Internal Medicine

## 2010-08-16 NOTE — Telephone Encounter (Signed)
Suspect this report generated in error

## 2010-08-18 ENCOUNTER — Ambulatory Visit: Payer: Managed Care, Other (non HMO) | Admitting: Sports Medicine

## 2010-08-23 ENCOUNTER — Encounter: Payer: Self-pay | Admitting: Internal Medicine

## 2010-08-23 ENCOUNTER — Ambulatory Visit (INDEPENDENT_AMBULATORY_CARE_PROVIDER_SITE_OTHER): Payer: Managed Care, Other (non HMO) | Admitting: Endocrinology

## 2010-08-23 ENCOUNTER — Encounter: Payer: Self-pay | Admitting: Endocrinology

## 2010-08-23 VITALS — BP 122/64 | HR 100 | Temp 98.4°F | Ht 68.0 in | Wt 131.6 lb

## 2010-08-23 DIAGNOSIS — E0590 Thyrotoxicosis, unspecified without thyrotoxic crisis or storm: Secondary | ICD-10-CM

## 2010-08-23 MED ORDER — METHIMAZOLE 10 MG PO TABS: ORAL_TABLET | ORAL | Status: DC

## 2010-08-23 NOTE — Patient Instructions (Addendum)
For now, take methimazole 4x10 mg 2x a day. if ever you have fever while taking this medication, stop it and call us, because of the risk of a rare side-effect Please make a follow-up appointment in 1 month.

## 2010-08-23 NOTE — Progress Notes (Signed)
  Subjective:    Patient ID: [name], female    DOB: Jan 08, 1966, 45 y.o.   MRN: [id number]  HPI Pt has hospitalized 3 mos ago with chest pain, and she was found to have hyperthyroidism.  She reports few years of intermittent palpitations in the chest, and assoc doe.  She was rx'ed with inderal, then with lopressor.  On the b-blocker, sxs are improved but not resolved.   Past Medical History  Diagnosis Date  . Menometrorrhagia   . Depression   . Granulomatous lung disease   . Anxiety   . Migraines   . Grave's disease    Past Surgical History  Procedure Date  . Bilateral tubal 1993    reports that she has been smoking.  She does not have any smokeless tobacco history on file. Her alcohol and drug histories not on file. Married Engineering geologist. family history includes Heart attack in her mother and Hypertension in her brother, father, and mother.  No thyroid probs. No Known Allergies  Review of Systems denies double vision, diarrhea, polyuria, numbness, fever, and anxiety.  Chest pain is resolved.  She reports weight loss, acne, headache, hoarseness, myalgias, tremor, anxiety, easy bruising, rhinorrhea, and excessive diaphoresis.  Menses are variable.     Objective:   Physical Exam VS: see vs page GEN: no distress.  Lean body habitus.   HEAD: no deformity eyes: no periorbital swelling.  There is slight bilat proptosis external nose and ears are normal mouth: no lesion seen NECK: supple, thyroid is low-lying.  it seems enlarged, but i can't tell details.   CHEST WALL: no deformity. CV: tachycardic rate and normal rhythm, no murmur. ABD: abdomen is soft, nontender.  no hepatosplenomegaly.  not distended.  no hernia MUSCULOSKELETAL: muscle bulk and strength are grossly normal.  no obvious joint swelling.  gait is normal and steady EXTEMITIES: no deformity.  no ulcer on the feet.  feet are of normal color and temp.  no edema PULSES: dorsalis pedis intact bilat.  no carotid  bruit NEURO:  cn 2-12 grossly intact.   readily moves all 4's.  sensation is intact to touch on the feet.  There is a coarse tremor of the hands.  SKIN:  Normal texture and temperature.  No rash or suspicious lesion is visible.  Not diaphoretic.  NODES:  None palpable at the neck. PSYCH: alert, oriented x3.  Does not appear anxious nor depressed.     Lab Results  Component Value Date   TSH 0.008* 06/03/2010      Assessment & Plan:  1.  Too clinically hyperthyroid to have i-131 rx now.  New problem to me.  2.  Chest pain, could be related to #1.  3.  Weight loss, prob due to #1.

## 2010-08-24 DIAGNOSIS — E0590 Thyrotoxicosis, unspecified without thyrotoxic crisis or storm: Secondary | ICD-10-CM | POA: Insufficient documentation

## 2010-09-13 NOTE — H&P (Signed)
[name], [name] NO.:  [id number]   MEDICAL RECORD NO.:  [id number]          PATIENT TYPE:  INP   LOCATION:  3114                         FACILITY:  [facility]   PHYSICIAN:  Deanna Artis. Hickling, M.D.DATE OF BIRTH:  Dec 27, 1965   DATE OF ADMISSION:  09/08/2008  DATE OF DISCHARGE:                              HISTORY & PHYSICAL   CHIEF COMPLAINT:  Left-sided numbness and weakness, last known normal  0900.   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  She was at home today watching TV and she had onset of  tightness in her face on the left side, which extended down her body and  was associated with weakness.   She called a friend around 11 o'clock who recommended that she call EMS.  She did not want to call attention to herself and had the friends bring  her in.  She arrived at 12:23.  Reasons that are unclear, code stroke  was not immediately called.  She was evaluated at the 12:36 by Dr. Donnetta Hutching who called a code stroke at 12:50.  I arrived within less then 5  minutes.  She is still in the emergency room.  We brought her to CT scan  where a CT was done and showed no abnormalities.  I interpreted it at  13:05.  Initial examination began at 13:10.  NIH stroke scale was 3 at  13:15.  I elected at that time, despite the fact that I have some  concerns over consistency in her exam to order t-PA.  She had  appropriate time.  She had consistent sensory examination and the  inconsistencies were in the motor examination, which seemed to show some  giveaway strength in the leg and in her visual examination, which  suggested a homonymous field cut.   I felt that if anything was a distal middle cerebral artery distribution  infarction and that she would not be a candidate for interventional t-  PA.   The patient has had a migraine headache since 10 or 11 o'clock last  night.  She awakened again this morning with a headache.  She  has had  red dots in her visual field with her headaches before, but has never  had numbness or weakness.   PAST MEDICAL HISTORY:  Unremarkable for conditions that would increase  her risk of stroke.  She is a gravida 4 and para 4.  One child died of a  motor vehicle accident.  She has had no surgery.  Her only medications  are vitamins.  She supposed to be taking antidepressants, but has not  been taking them.   DRUG ALLERGIES:  None known.   FAMILY HISTORY:  Brother had either a stroke or myocardial infarction at  age 63.  Father is not certain.  Mother died of heart disease at age 36.  Father is well, but says he is old.  Paternal grandmother had number  of mini strokes and diabetes in her late 3s.  Maternal grandparents  have heart disease.   SOCIAL HISTORY:  The patient  is engaged, to be married.  She has 2 years  of college.  She has began smoking at age 8 and smokes about 10  cigarettes a day.  She does drinks though 5-6 beers on the weekend.  She  does not use drugs.   REVIEW OF SYSTEMS:  Positive for migraines that began at age 51.  She  has had no chest pain, hypertension, or heart disease.  PULMONARY:  She  had asthma that was complicated.  She had a collapsed lung for reasons  that are unclear to me.  She never received a chest tube.  GI: She had  nausea associated with her headache.  GU:  She has had a recent urinary  tract infection.  MUSCULOSKELETAL:  She has chronic low back pain.  ENDOCRINE:  No diabetes or thyroid disease.  Reproductive:  She is still  menstruating.  Hematologic/lymphatic:  She has anemia with bruisability.  No sickle cell.  ALLERGY/IMMUNOLOGY:  Negative to environment.  PSYCHIATRIC:  Depression.  Twelve-system review is otherwise negative.   PHYSICAL EXAMINATION:  VITAL SIGNS:  Temperature 98.6, blood pressure  115/74, resting pulse 60, respirations were 18, and oxygen saturation  100%.  Height 5 feet 10 inches.  Weight 138 pounds.  EAR, NOSE  AND THROAT:  No infections.  LUNGS:  Clear.  HEART:  No murmurs.  Pulses normal.  NECK:  Supple.  No bruits.  ABDOMEN:  Soft.  Bowel sounds normal.  SKIN:  No lesions.  NEUROLOGIC:  The patient does not have aphasia.  She has normal speech.  Cranial Nerves:  Round reactive pupils.  Visual fields are probably  full, although inconsistent.  She has symmetric facial strength.  Midline tongue.  Air conduction greater than bone conduction.  Motor  examination:  The patient had giveaway strength in her left leg.  She  had quite good strength in the right arm and leg, and also in her left  arm.  The only drift that she showed was of her left leg.  She has a  left hemisensory that involves her face, arm and leg.  Interestingly,  she had good stereognosis of both sides.  Deep tendon reflexes did not  show reflex predominance.  She had neutral plantar responses.   LABORATORIES:  CT was normal.  White count 3800, hemoglobin 11.6,  hematocrit 34.6, and platelets 246,000.  PT 12.5, INR 0.9, and PTT 28.  Electrolytes are not yet available.   IMPRESSION:  1. Left hemisensory, stable.  2. Left hemiparesis.  3. Homonymous hemianopsia left, equivocal.   PLAN:  The patient was given intravenous t-PA.  I could have held it  because of my concerns over the consistency of the examination, but I  felt in total that she should receive medication because I believe  through examination was consistent.  We will review this case with MRI  scan later in the day to determine whether or not she had a visible  stroke.  It could be that she had a complicated migraine, she has never  had a migraine like this.  Her only risk factors for stroke is positive  family history.  She does not have a hypercoagulable state and she has  not had other issues that would ordinarily lead to stroke. (784.0,  782.0, 344.22)   She will be placed in the intensive care unit.  We will check her stroke  swallow screen.  We will  involve PT/OT if needed.  If she recovers  completely, we  will consider rehab, but likely reject it because of no  need.  We need to get  family practice involved with her care and let them know that she has  been admitted, so that they can provide the in-hospital assistance and  outpatient continuity.   I appreciate the opportunity to participate in her care.      Deanna Artis. Sharene Skeans, M.D.  Electronically Signed     [facility]  D:  09/08/2008  T:  09/09/2008  Job:  [id number]   cc:   [facility]

## 2010-09-13 NOTE — Discharge Summary (Signed)
NAMEPHILOMENA, [name]              ACCOUNT NO.:  [id number]   MEDICAL RECORD NO.:  [id number]          PATIENT TYPE:  INP   LOCATION:  3025                         FACILITY:  [facility]   PHYSICIAN:  Pramod P. Pearlean Brownie, MD    DATE OF BIRTH:  Sep 01, 1965   DATE OF ADMISSION:  09/08/2008  DATE OF DISCHARGE:  09/10/2008                               DISCHARGE SUMMARY   DIAGNOSES AT THE TIME OF DISCHARGE:  1. Complicated migraine with neurologic symptoms, now resolved, status      post intravenous tissue plasminogen activator.  2. Migraine history.  3. Gravida 4, para 4.   MEDICINE AT THE TIME OF DISCHARGE:  Aspirin 325 mg a day recommended.   STUDIES PERFORMED:  1. CT of the brain on admission showed no acute abnormalities.  2. MRI of the brain showed no acute stroke.  Nonspecific cerebral      white matter signal abnormality of doubtful clinical significance      for age.  3. MRA of the brain negative with anatomic variations.  4. CT of the brain post t-PA shows no acute abnormality, negative for      hemorrhage.  5. A 2-D echocardiogram performed, results pending.  6. Carotid Doppler shows no ICA stenosis.  7. EKG shows sinus bradycardia.   LABORATORY STUDIES:  Homocystine 5.2.  Hemoglobin A1c 5.6.  Cholesterol  138, triglycerides 90, HDL 52, LDL 68.  Urinalysis negative.  CBC with  hemoglobin 11.6, hematocrit 34.6, white blood cells 3.8, and platelets  246.  Chemistry with glucose 106, otherwise, normal liver function  tests, normal albumin 3.2.  Cardiac enzymes negative.  Coagulation  studies negative.   HISTORY OF PRESENT ILLNESS:  [name] is a 45 year old right-  handed African American female, who was at home on the day of admission  watching TV when she had onset of tightness in her face and of her left  side, which extended down her body and was associated with weakness.  She had called a friend, who arrived at 11:00 a.m., who recommended she  call EMS.  She did not  call attention to herself and had the friends  bring her in.  She arrived at the hospital at 12:23.  She was evaluated  by the EDP who called the code stroke at 12:50.  CT of the brain was  performed, which showed no acute abnormalities.  NIH stroke scale was 3.  There were some concerns over consistency of her exam related to motor  but consistent sensory exam.  She did seem to show some giveaway  strength in the leg, and her visual exam was suggestive of field cut.  She was within the t-PA window, and t-PA was elected to be given.  She  was admitted to the neuro ICU post IV t-PA administration.   HOSPITAL COURSE:  MRI was negative for acute stroke.  The patient  tolerated IV t-PA well with no resulting hemorrhage.  She was placed on  aspirin 325 mg a day for stroke prevention.  She was evaluated by PT and  OT and speech and basically  felt to have no acute deficits.  It was felt  in retrospect, she did not have stroke or stroke-like symptoms but that  of a complicated migraine with neurologic symptoms.  The patient states  she has complicated migraine, approximately 2 times per month.  She has  not tried any prophylactic medications at this point.  She has been  advised to follow up with Dr. Sharene Skeans at the time of discharge for a  potential migraine management.  She has been asked to stop smoking.   CONDITION AT DISCHARGE:  The patient is alert and oriented x3.  No  cranial nerve deficits.  No focal weakness.  Heart rate regular.  Breath  sounds are clear.   DISCHARGE/PLAN:  1. Discharge home with family.  2. Aspirin for stroke prevention.  3. Stop smoking.  4. Follow up with Dr. Sharene Skeans for migraine management.  5. Follow up with primary care physician within 1 month.      Annie Main, N.P.    ______________________________  Sunny Schlein. Pearlean Brownie, MD    SB/MEDQ  D:  09/10/2008  T:  09/10/2008  Job:  [id number]   cc:   Deanna Artis. Sharene Skeans, M.D.  [facility]

## 2010-09-16 NOTE — Op Note (Signed)
   NAME:  [name], [name]                        ACCOUNT NO.:  [id number]   MEDICAL RECORD NO.:  [id number]                   PATIENT TYPE:  AMB   LOCATION:  DSC                                  FACILITY:  [facility]   PHYSICIAN:  Cindee Salt, M.D.                    DATE OF BIRTH:  17-May-1965   DATE OF PROCEDURE:  12/30/2002  DATE OF DISCHARGE:                                 OPERATIVE REPORT   PREOPERATIVE DIAGNOSIS:  Probable fungal infection, nails, right hand.   POSTOPERATIVE DIAGNOSIS:  Probable fungal infection, nails, right hand.   OPERATION:  Removal of thumbnail plate with cultures, right thumb.   SURGEON:  Cindee Salt, M.D.   ASSISTANT:  Joaquin Courts, R.N.   ANESTHESIA:  Local.   DATE OF OPERATION:  December 30, 2002   HISTORY:  The patient is a 45 year old female with a long history of  deformities to her nails not responsive to conservative treatment.   PROCEDURE:  The patient was brought to the operating room, a metacarpal  block given with 1% Xylocaine without epinephrine to her right thumb in a  supine position.  She was prepped using Duraprep.  A Freer elevator after  placement of a Penrose drain for a tourniquet control was used to remove the  nail plate.  This was sent to pathology.  Scrapings were then taken of the  nail bed.  Significant thickening of the nail matrix was present.  This was  minimally debrided.  The KOH preparation was sent along with standard  cultures.  The patient tolerated the procedure well.  A nonadherent gauze  was placed in the nail folds, sterile compressive dressing applied.  The  patient was discharged to home to return in one week on Tylenol No.3.                                               Cindee Salt, M.D.    Angelique Blonder  D:  12/30/2002  T:  12/30/2002  Job:  [id number]

## 2010-09-22 ENCOUNTER — Ambulatory Visit (INDEPENDENT_AMBULATORY_CARE_PROVIDER_SITE_OTHER): Payer: Managed Care, Other (non HMO) | Admitting: Endocrinology

## 2010-09-22 ENCOUNTER — Other Ambulatory Visit (INDEPENDENT_AMBULATORY_CARE_PROVIDER_SITE_OTHER): Payer: Managed Care, Other (non HMO)

## 2010-09-22 ENCOUNTER — Encounter: Payer: Self-pay | Admitting: Endocrinology

## 2010-09-22 VITALS — BP 108/70 | HR 102 | Temp 98.2°F | Ht 68.0 in | Wt 126.8 lb

## 2010-09-22 DIAGNOSIS — E0590 Thyrotoxicosis, unspecified without thyrotoxic crisis or storm: Secondary | ICD-10-CM

## 2010-09-22 MED ORDER — TRIAMCINOLONE ACETONIDE 0.025 % EX CREA: TOPICAL_CREAM | Freq: Three times a day (TID) | CUTANEOUS | Status: DC
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
## 2010-09-22 NOTE — Patient Instructions (Addendum)
if ever you have fever while taking this medication, stop it and call us, because of the risk of a rare side-effect Please make a follow-up appointment in 1 month. blood tests are being ordered for you today.  please call [phone] to hear your test results.  You will be prompted to enter the 9-digit "MRN" number that appears at the top left of this page, followed by #.  Then you will hear the message. pending the test results, please continue the same medications for now. i have sent a prescription to your pharmacy, to take as needed for itching. (update: i left message on phone-tree:  tft are improved. Cont same rx).

## 2010-09-23 LAB — T4, FREE: Free T4: 1.69 ng/dL — ABNORMAL HIGH (ref 0.60–1.60)

## 2010-10-07 ENCOUNTER — Telehealth: Payer: Self-pay

## 2010-10-19 NOTE — Telephone Encounter (Signed)
A user error has taken place: encounter opened in error, closed for administrative reasons.

## 2010-10-27 ENCOUNTER — Ambulatory Visit (INDEPENDENT_AMBULATORY_CARE_PROVIDER_SITE_OTHER): Payer: Managed Care, Other (non HMO) | Admitting: Endocrinology

## 2010-10-27 ENCOUNTER — Encounter: Payer: Self-pay | Admitting: Endocrinology

## 2010-10-27 ENCOUNTER — Other Ambulatory Visit (INDEPENDENT_AMBULATORY_CARE_PROVIDER_SITE_OTHER): Payer: Managed Care, Other (non HMO)

## 2010-10-27 VITALS — BP 102/66 | HR 76 | Temp 99.0°F | Ht 68.0 in | Wt 127.6 lb

## 2010-10-27 DIAGNOSIS — E0590 Thyrotoxicosis, unspecified without thyrotoxic crisis or storm: Secondary | ICD-10-CM

## 2010-10-27 DIAGNOSIS — L299 Pruritus, unspecified: Secondary | ICD-10-CM

## 2010-10-27 DIAGNOSIS — L501 Idiopathic urticaria: Secondary | ICD-10-CM | POA: Insufficient documentation

## 2010-10-27 MED ORDER — METOPROLOL SUCCINATE ER 50 MG PO TB24: 50.0000 mg | ORAL_TABLET | Freq: Every day | ORAL | Status: DC

## 2010-10-27 MED ORDER — METOPROLOL TARTRATE 25 MG PO TABS: 25.0000 mg | ORAL_TABLET | Freq: Two times a day (BID) | ORAL | Status: DC
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
## 2010-10-27 NOTE — Patient Instructions (Addendum)
if ever you have fever while taking this propylthiouracil, stop it and call us, because of the risk of a rare side-effect Please make a follow-up appointment in 1 month. blood tests are being ordered for you today.  please call [phone] to hear your test results.  You will be prompted to enter the 9-digit "MRN" number that appears at the top left of this page, followed by #.  Then you will hear the message. Upon knowing the results, i'll change the methimazole to propylthiouracil.  The number of pills per day will depend on the blood test results.    Refer to an allergist.  you will be called with a day and time for an appointment. Reduce the metoprolol to extended-release 50 mg daily. (update: i left message on phone-tree:  Start ptu 4x50 mg bid)

## 2010-10-28 LAB — T4, FREE: Free T4: 0.54 ng/dL — ABNORMAL LOW (ref 0.60–1.60)

## 2010-10-28 MED ORDER — PROPYLTHIOURACIL 50 MG PO TABS: 200.0000 mg | ORAL_TABLET | Freq: Two times a day (BID) | ORAL | Status: AC

## 2010-10-31 ENCOUNTER — Telehealth: Payer: Self-pay

## 2010-10-31 ENCOUNTER — Other Ambulatory Visit: Payer: Self-pay | Admitting: Endocrinology

## 2010-10-31 NOTE — Telephone Encounter (Signed)
You need only toprol 50/d.  i changed rx

## 2010-10-31 NOTE — Telephone Encounter (Signed)
Pt advised of Rx/pharmacy via VM 

## 2010-10-31 NOTE — Telephone Encounter (Signed)
Pt called requesting MD advise on medications, does she need to be on Lopressor 25 mg bid or Toprol-XR 50 mg qd? Both prescription were sent to her pharmacy

## 2010-11-01 ENCOUNTER — Telehealth: Payer: Self-pay | Admitting: *Deleted

## 2010-11-01 DIAGNOSIS — J9859 Other diseases of mediastinum, not elsewhere classified: Secondary | ICD-10-CM

## 2010-11-01 NOTE — Telephone Encounter (Signed)
Message copied by Christen Butter on Tue Nov 01, 2010 11:32 AM ------      Message from: Christen Butter      Created: Mon Aug 01, 2010  3:42 PM       Pt needs f/u limited ct chest- f/u med mass

## 2010-11-01 NOTE — Telephone Encounter (Signed)
LMTCB

## 2010-11-17 ENCOUNTER — Ambulatory Visit (INDEPENDENT_AMBULATORY_CARE_PROVIDER_SITE_OTHER)
Admission: RE | Admit: 2010-11-17 | Discharge: 2010-11-17 | Disposition: A | Discharge status: Home / Self Care | Payer: Managed Care, Other (non HMO) | Source: Ambulatory Visit | Attending: Internal Medicine | Admitting: Internal Medicine

## 2010-11-17 DIAGNOSIS — R222 Localized swelling, mass and lump, trunk: Secondary | ICD-10-CM

## 2010-11-17 DIAGNOSIS — J9859 Other diseases of mediastinum, not elsewhere classified: Secondary | ICD-10-CM

## 2010-11-17 NOTE — Progress Notes (Signed)
Quick Note:  Spoke with pt and notified of results per Dr. Wert. Pt verbalized understanding and denied any questions.  ______ 

## 2010-11-29 ENCOUNTER — Encounter: Payer: Self-pay | Admitting: Internal Medicine

## 2010-11-29 ENCOUNTER — Ambulatory Visit (INDEPENDENT_AMBULATORY_CARE_PROVIDER_SITE_OTHER): Payer: Managed Care, Other (non HMO) | Admitting: Internal Medicine

## 2010-11-29 ENCOUNTER — Other Ambulatory Visit: Payer: Managed Care, Other (non HMO)

## 2010-11-29 VITALS — BP 98/60 | HR 64 | Ht 68.0 in | Wt 124.2 lb

## 2010-11-29 DIAGNOSIS — L501 Idiopathic urticaria: Secondary | ICD-10-CM

## 2010-11-29 DIAGNOSIS — J309 Allergic rhinitis, unspecified: Secondary | ICD-10-CM

## 2010-11-29 MED ORDER — FAMOTIDINE 20 MG PO TABS: 20.0000 mg | ORAL_TABLET | Freq: Two times a day (BID) | ORAL | Status: DC

## 2010-11-29 MED ORDER — MOMETASONE FUROATE 50 MCG/ACT NA SUSP: 2.0000 | Freq: Every day | NASAL | Status: DC

## 2010-11-29 NOTE — Progress Notes (Signed)
  Subjective:    Patient ID: [name], female    DOB: 1965/06/22, 45 y.o.   MRN: [id number]  HPI    Review of Systems  Constitutional: Positive for unexpected weight change.  Cardiovascular: Positive for palpitations.  Gastrointestinal: Positive for abdominal pain.  Skin: Positive for rash.  Neurological: Positive for headaches.  Psychiatric/Behavioral: Positive for dysphoric mood.       Objective:   Physical Exam        Assessment & Plan:   No problem-specific assessment & plan notes found for this encounter.

## 2010-11-29 NOTE — Progress Notes (Signed)
Subjective:    Patient ID: [name], female    DOB: 10-Feb-1966, 45 y.o.   MRN: [id number]  HPI 11/29/10- 34 yo Fsmoker here with sister to be seen on kind referral from Dr. Everardo All because of hives. She is followed for primary care at the [facility]. She has seen Dr. Sherene Sires in the past for lung disease She had an episode of hives 5 or 6 years ago. Skin testing then was broadly positive. She was treated with medications for one year and the problem resolved. Hives began again in April of this year, and occur daily. Heat seems to make it worse. Hives and itching are controlled with daily Claritin. She has a history of perennial allergic rhinitis since childhood and was on allergy shots as a child. She has not had significant asthma or food allergy. An angiotensin-converting enzyme level was elevated at 57 on 05/18/2010 but she has never been diagnosed with sarcoid. CT scan of the chest on 11/04/2010 showed old granulomatous changes and adenopathy but no active disease. There is no history of kidney or liver disease. Active heartburn is not treated. Diagnosed with Graves' disease. Had pneumonia once in childhood. No tuberculosis exposure. She attributes sweating to perimenopausal symptoms but otherwise denies night sweats fever adenopathy or cough. She does smoke 10 cigarettes a day. She is not on any sort of hormone replacement.   Review of Systems Constitutional:   No-   weight loss, night sweats, fevers, chills, fatigue, lassitude. HEENT:   No-   headaches, difficulty swallowing, tooth/dental problems, sore throat,                  No-   sneezing, itching, ear ache, nasal congestion, post nasal drip,   CV:  No-   chest pain, orthopnea, PND, swelling in lower extremities, anasarca, dizziness, palpitations  GI:  No-   heartburn, indigestion, abdominal pain, nausea, vomiting, diarrhea,                 change in bowel habits, loss of appetite  Resp: No-   shortness of breath with  exertion or at rest.  No-  excess mucus,             No-   productive cough,  No non-productive cough,  No-  coughing up of blood.              No-   change in color of mucus.  No- wheezing.    Skin: Per HPI  GU: No-   dysuria, change in color of urine, no urgency or frequency.  No- flank pain.  MS:  No-   joint pain or swelling.  No- decreased range of motion.  No- back pain.  Psych:  No- change in mood or affect. No depression or anxiety.  No memory loss.      Objective:   Physical Exam General- Alert, Oriented, Affect-appropriate, Distress- none acute, slender Skin- rash-none, lesions- none, excoriation- none    no visible rash today. Lymphadenopathy- none Head- atraumatic            Eyes- Gross vision intact, PERRLA, conjunctivae clear secretions            Ears- Hearing, canals            Nose-+polyp right nostril, Septal dev, mucus, polyps, erosion, perforation             Throat- Mallampati II , mucosa clear , drainage- none, tonsils- atrophic Neck- flexible ,  trachea midline, no stridor , thyroid nl, carotid no bruit Chest - symmetrical excursion , unlabored           Heart/CV- RRR , no murmur , no gallop  , no rub, nl s1 s2                           - JVD- none , edema- none, stasis changes- none, varices- none           Lung- clear to P&A, wheeze- none, cough- none , dullness-none, rub- none           Chest wall-  Abd- tender-no, distended-no, bowel sounds-present, HSM- no Br/ Gen/ Rectal- Not done, not indicated Extrem- cyanosis- none, clubbing, none, atrophy- none, strength- nl Neuro- grossly intact to observation         Assessment & Plan:   No problem-specific assessment & plan notes found for this encounter.

## 2010-11-29 NOTE — Patient Instructions (Signed)
Order lab- Allergy profile    Dx urticaria  Place PPD skin test      Sample and script Nasonex nasal steroid spray---  2 sprays in each nostril every night at bedtime  Continue daily claritin/ loratadine antihistamine  Add- Pepcid/ famotidine 20 mg  As antihistamine and acid blocker      1 pill twice daily before meals

## 2010-11-29 NOTE — Assessment & Plan Note (Signed)
Nasal polyp on right. No aspirin allergy. Will give nasal steroid.

## 2010-11-29 NOTE — Assessment & Plan Note (Signed)
Possible relation to her perimenopausal state or her thyroid disease.  We will use dual antihistamine therapy while also addresing her heart burn, by continuing claritin and changing prilosec to pepcid Allergy profile Sarcoid can cause hives and she has old granulomatous scarring/ adenopathy in chest with elevated ACE. Will place PPD and will need to f/u with Dr Sherene Sires for her lung status as a smoker.

## 2010-12-01 DIAGNOSIS — D869 Sarcoidosis, unspecified: Secondary | ICD-10-CM

## 2010-12-01 LAB — ALLERGY PROFILE REGION II-DC, DE, MD, ~~LOC~~, VA
Aspergillus fumigatus, IgG: <0.1 kU/L (ref <0.35)
Bermuda Grass: <0.1 kU/L (ref <0.35)
Cat Dander: <0.1 kU/L (ref <0.35)
D. farinae: <0.1 kU/L (ref <0.35)
Elm IgE: <0.1 kU/L (ref <0.35)
Lamb's Quarters: <0.1 kU/L (ref <0.35)
Meadow Grass: 0.54 kU/L — ABNORMAL HIGH (ref <0.35)
Oak: <0.1 kU/L (ref <0.35)

## 2010-12-02 LAB — TB SKIN TEST: TB Skin Test: NEGATIVE mm

## 2010-12-05 NOTE — Progress Notes (Signed)
Quick Note:  PT aware of results. ______ 

## 2010-12-30 ENCOUNTER — Ambulatory Visit (INDEPENDENT_AMBULATORY_CARE_PROVIDER_SITE_OTHER): Payer: Managed Care, Other (non HMO) | Admitting: Internal Medicine

## 2010-12-30 ENCOUNTER — Encounter: Payer: Self-pay | Admitting: Internal Medicine

## 2010-12-30 VITALS — BP 124/82 | HR 86 | Ht 68.0 in | Wt 125.0 lb

## 2010-12-30 DIAGNOSIS — D860 Sarcoidosis of lung: Secondary | ICD-10-CM | POA: Insufficient documentation

## 2010-12-30 DIAGNOSIS — L501 Idiopathic urticaria: Secondary | ICD-10-CM

## 2010-12-30 DIAGNOSIS — J99 Respiratory disorders in diseases classified elsewhere: Secondary | ICD-10-CM

## 2010-12-30 DIAGNOSIS — D869 Sarcoidosis, unspecified: Secondary | ICD-10-CM

## 2010-12-30 MED ORDER — PREDNISONE 10 MG PO TABS: ORAL_TABLET | ORAL | Status: DC

## 2010-12-30 NOTE — Assessment & Plan Note (Signed)
This and/or the Graves could cause her hives. We will try prednisone.

## 2010-12-30 NOTE — Patient Instructions (Signed)
Script sent for prednisone  Treating both the hives and the sarcoid

## 2010-12-30 NOTE — Progress Notes (Signed)
Subjective:    Patient ID: [name], female    DOB: 29-Mar-1966, 45 y.o.   MRN: [id number]  HPI    Review of Systems     Objective:   Physical Exam        Assessment & Plan:   Subjective:    Patient ID: [name], female    DOB: 12-06-1965, 45 y.o.   MRN: [id number]  HPI 11/29/10- 33 yo Fsmoker here with sister to be seen on kind referral from Dr. Everardo All because of hives. She is followed for primary care at the [facility]. She has seen Dr. Sherene Sires in the past for lung disease She had an episode of hives 5 or 6 years ago. Skin testing then was broadly positive. She was treated with medications for one year and the problem resolved. Hives began again in April of this year, and occur daily. Heat seems to make it worse. Hives and itching are controlled with daily Claritin. She has a history of perennial allergic rhinitis since childhood and was on allergy shots as a child. She has not had significant asthma or food allergy. An angiotensin-converting enzyme level was elevated at 57 on 05/18/2010 but she has never been diagnosed with sarcoid. CT scan of the chest on 11/04/2010 showed old granulomatous changes and adenopathy but no active disease. There is no history of kidney or liver disease. Active heartburn is not treated. Diagnosed with Graves' disease. Had pneumonia once in childhood. No tuberculosis exposure. She attributes sweating to perimenopausal symptoms but otherwise denies night sweats fever adenopathy or cough. She does smoke 10 cigarettes a day. She is not on any sort of hormone replacement.  12/30/10- 45 yoF followed for urticaria, perennial allergic rhinitis, tobacco abuse, old granulomatous scarring on CT with ACE 73  Husband here Since last here, hives were gone for most of the time, but then returned, not as bad. She has done this before, so can't say adding Pepcid to Vistaril made any difference. She is concerned about her weight loss with Graves.  We discussed sarcoid as possible cause of weight loss or urticaria.  We discussed her Grave's disease as possible cause of weight loss or urticaria.Marland Kitchen Her husband remembers that she tool prednisone for the last round of hives 5 or 6 years ago.  Discussed steroid side effects.    Review of Systems Constitutional:   No-   weight loss, night sweats, fevers, chills, fatigue, lassitude. HEENT:   No-   headaches, difficulty swallowing, tooth/dental problems, sore throat,                  No-   sneezing, itching, ear ache, nasal congestion, post nasal drip,  CV:  No-   chest pain, orthopnea, PND, swelling in lower extremities, anasarca, dizziness, palpitations GI:  No-   heartburn, indigestion, abdominal pain, nausea, vomiting, diarrhea,                 change in bowel habits, loss of appetite Resp: No-   shortness of breath with exertion or at rest.  No-  excess mucus,             No-   productive cough,  No non-productive cough,  No-  coughing up of blood.              No-   change in color of mucus.  No- wheezing.   Skin: Per HPI GU: No-   dysuria, change in color of urine, no  urgency or frequency.  No- flank pain. MS:  No-   joint pain or swelling.  No- decreased range of motion.  No- back pain. Psych:  No- change in mood or affect. No depression or anxiety.  No memory loss.      Objective:   Physical Exam General- Alert, Oriented, Affect-appropriate, Distress- none acute, slender/ thin Skin- rash-none, lesions- none, excoriation- none    no visible rash today. Lymphadenopathy- none Head- atraumatic            Eyes- Gross vision intact, PERRLA, conjunctivae clear secretions            Ears- Hearing, canals normal            Nose-+polyp right nostril, no-Septal dev, mucus, erosion, perforation             Throat- Mallampati II , mucosa clear , drainage- none, tonsils- atrophic Neck- flexible , trachea midline, no stridor , thyroid nl, carotid no bruit Chest - symmetrical excursion ,  unlabored           Heart/CV- RRR , no murmur , no gallop  , no rub, nl s1 s2                           - JVD- none , edema- none, stasis changes- none, varices- none           Lung- clear to P&A, wheeze- none, cough- none , dullness-none, rub- none           Chest wall-  Abd- tender-no, distended-no, bowel sounds-present, HSM- no Br/ Gen/ Rectal- Not done, not indicated Extrem- cyanosis- none, clubbing, none, atrophy- none, strength- nl Neuro- grossly intact to observation         Assessment & Plan:   No problem-specific assessment & plan notes found for this encounter.

## 2011-01-02 NOTE — Assessment & Plan Note (Signed)
Nonspecific and idiopathic. Low-dose prednisone suppression started 12/30/2010

## 2011-01-12 ENCOUNTER — Telehealth: Payer: Self-pay

## 2011-01-12 NOTE — Telephone Encounter (Signed)
Please move up appt to today

## 2011-01-12 NOTE — Telephone Encounter (Signed)
Pt called stating she has been experiencing severe cramping of her upper and lower extremities since this morning. Pt says that once cramps have subsided, sever pain develops. Pt has appt scheduled tomorrow morning but is requesting Rx for pain today, please advise.

## 2011-01-13 ENCOUNTER — Encounter: Payer: Self-pay | Admitting: Endocrinology

## 2011-01-13 ENCOUNTER — Other Ambulatory Visit (INDEPENDENT_AMBULATORY_CARE_PROVIDER_SITE_OTHER): Payer: Managed Care, Other (non HMO)

## 2011-01-13 ENCOUNTER — Ambulatory Visit (INDEPENDENT_AMBULATORY_CARE_PROVIDER_SITE_OTHER): Payer: Managed Care, Other (non HMO) | Admitting: Endocrinology

## 2011-01-13 VITALS — BP 108/80 | HR 81 | Temp 98.7°F | Ht 69.0 in | Wt 128.0 lb

## 2011-01-13 DIAGNOSIS — R252 Cramp and spasm: Secondary | ICD-10-CM

## 2011-01-13 DIAGNOSIS — E0590 Thyrotoxicosis, unspecified without thyrotoxic crisis or storm: Secondary | ICD-10-CM

## 2011-01-13 LAB — BASIC METABOLIC PANEL
BUN: 16 mg/dL (ref 6–23)
CO2: 27 mEq/L (ref 19–32)
Glucose, Bld: 81 mg/dL (ref 70–99)
Potassium: 3.8 mEq/L (ref 3.5–5.1)
Sodium: 140 mEq/L (ref 135–145)
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
## 2011-01-13 NOTE — Patient Instructions (Addendum)
if ever you have fever while taking the propylthiouracil, stop it and call us, because of the risk of a rare side-effect Please make a follow-up appointment in 6 weeks. blood tests are being ordered for you today.  please call [phone] to hear your test results.  You will be prompted to enter the 9-digit "MRN" number that appears at the top left of this page, followed by #.  Then you will hear the message. pending the test results, please continue the same medications for now. (update: i left message on phone-tree:  Same rx.  No cause of sxs is found)

## 2011-02-14 ENCOUNTER — Ambulatory Visit (INDEPENDENT_AMBULATORY_CARE_PROVIDER_SITE_OTHER): Payer: Managed Care, Other (non HMO) | Admitting: Internal Medicine

## 2011-02-14 ENCOUNTER — Encounter: Payer: Self-pay | Admitting: Internal Medicine

## 2011-02-14 ENCOUNTER — Other Ambulatory Visit (INDEPENDENT_AMBULATORY_CARE_PROVIDER_SITE_OTHER): Payer: Managed Care, Other (non HMO)

## 2011-02-14 VITALS — BP 110/74 | HR 68 | Ht 68.0 in | Wt 131.0 lb

## 2011-02-14 DIAGNOSIS — D869 Sarcoidosis, unspecified: Secondary | ICD-10-CM

## 2011-02-14 DIAGNOSIS — L501 Idiopathic urticaria: Secondary | ICD-10-CM

## 2011-02-14 DIAGNOSIS — J99 Respiratory disorders in diseases classified elsewhere: Secondary | ICD-10-CM

## 2011-02-14 DIAGNOSIS — D860 Sarcoidosis of lung: Secondary | ICD-10-CM

## 2011-02-14 DIAGNOSIS — R252 Cramp and spasm: Secondary | ICD-10-CM

## 2011-02-14 DIAGNOSIS — J309 Allergic rhinitis, unspecified: Secondary | ICD-10-CM

## 2011-02-14 LAB — COMPREHENSIVE METABOLIC PANEL
Albumin: 3.9 g/dL (ref 3.5–5.2)
Alkaline Phosphatase: 73 U/L (ref 39–117)
BUN: 11 mg/dL (ref 6–23)
CO2: 27 mEq/L (ref 19–32)
GFR: 148.9 mL/min (ref >60.00)
Glucose, Bld: 80 mg/dL (ref 70–99)
Total Bilirubin: 0.4 mg/dL (ref 0.3–1.2)

## 2011-02-14 NOTE — Progress Notes (Signed)
Patient ID: [name], female    DOB: 01/03/1966, 45 y.o.   MRN: [id number]  HPI 11/29/10- 48 yo Fsmoker here with [name] to be seen on kind referral from Dr. Everardo All because of hives. She is followed for primary care at the [facility]. She has seen Dr. Sherene Sires in the past for lung disease She had an episode of hives 5 or 6 years ago. Skin testing then was broadly positive. She was treated with medications for one year and the problem resolved. Hives began again in April of this year, and occur daily. Heat seems to make it worse. Hives and itching are controlled with daily Claritin. She has a history of perennial allergic rhinitis since childhood and was on allergy shots as a child. She has not had significant asthma or food allergy. An angiotensin-converting enzyme level was elevated at 57 on 05/18/2010 but she has never been diagnosed with sarcoid. CT scan of the chest on 11/04/2010 showed old granulomatous changes and adenopathy but no active disease. There is no history of kidney or liver disease. Active heartburn is not treated. Diagnosed with Graves' disease. Had pneumonia once in childhood. No tuberculosis exposure. She attributes sweating to perimenopausal symptoms but otherwise denies night sweats fever adenopathy or cough. She does smoke 10 cigarettes a day. She is not on any sort of hormone replacement.  12/30/10- 45 yoF followed for urticaria, perennial allergic rhinitis, tobacco abuse, old granulomatous scarring on CT with ACE 24  [name] here Since last here, hives were gone for most of the time, but then returned, not as bad. She has done this before, so can't say adding Pepcid to Vistaril made any difference. She is concerned about Lori weight loss with Graves. We discussed sarcoid as possible cause of weight loss or urticaria.  We discussed Lori Grave's disease as possible cause of weight loss or urticaria.Marland Kitchen [name] remembers that she tool prednisone for the last round  of hives 5 or 6 years ago.  Discussed steroid side effects.   02/14/11- 45 yoF followed for urticaria, perennial allergic rhinitis, tobacco abuse, old granulomatous scarring on CT with ACE 57/ Sarcoid. She took herself off of most of Lori medications. Has not seen a change in hives so far after 2 days. She did continue famotidine and Nasonex and she continues alprazolam for Lori nerves. She is not on hormones or ACE inhibitors. We did demonstrate Dermagraft is him. She is noticing an increase in seasonal rhinitis with nasal congestion drip and sneeze. No fever or swollen glands.    Review of Systems Constitutional:   No-   weight loss, night sweats, fevers, chills, fatigue, lassitude. HEENT:   No-   headaches, difficulty swallowing, tooth/dental problems, sore throat,                  No-   sneezing, itching, ear ache, nasal congestion, post nasal drip,  CV:  No-   chest pain, orthopnea, PND, swelling in lower extremities, anasarca, dizziness, palpitations GI:  No-   heartburn, indigestion, abdominal pain, nausea, vomiting, diarrhea,                 change in bowel habits, loss of appetite Resp: No-   shortness of breath with exertion or at rest.  No-  excess mucus,             No-   productive cough,  No non-productive cough,  No-  coughing up of blood.  No-   change in color of mucus.  No- wheezing.   Skin: Per HPI GU: No-   dysuria, change in color of urine, no urgency or frequency.  No- flank pain. MS: Having muscle cramps arms and legs.  No- decreased range of motion.  No- back pain. Psych:  No- change in mood or affect. No depression or anxiety.  No memory loss.      Objective:   Physical Exam General- Alert, Oriented, Affect-appropriate, Distress- none acute, slender/ thin Skin- rash-none, lesions- none, excoriation- none    no visible rash today. Lymphadenopathy- none Head- atraumatic            Eyes- Gross vision intact, PERRLA, conjunctivae clear secretions             Ears- Hearing, canals normal            Nose-+polyp right nostril, no-Septal dev, mucus, erosion, perforation.  + Sniffing, periorbital edema, turbinate edema            Throat- Mallampati II , mucosa clear , drainage- none, tonsils- atrophic Neck- flexible , trachea midline, no stridor , thyroid nl, carotid no bruit Chest - symmetrical excursion , unlabored           Heart/CV- RRR , no murmur , no gallop  , no rub, nl s1 s2                           - JVD- none , edema- none, stasis changes- none, varices- none           Lung- clear to P&A, wheeze- none, + loose cough ,      dullness-none, rub- none           Chest wall-  Abd- tender-no, distended-no, bowel sounds-present, HSM- no Br/ Gen/ Rectal- Not done, not indicated Extrem- cyanosis- none, clubbing, none, atrophy- none, strength- nl Neuro- grossly intact to observation

## 2011-02-14 NOTE — Assessment & Plan Note (Signed)
Seasonal flare-  Plan -add back standard antihistamine, continue Nasonex

## 2011-02-14 NOTE — Assessment & Plan Note (Addendum)
Sarcoid activity might be related to the hives. We will recheck ACE level.

## 2011-02-14 NOTE — Patient Instructions (Signed)
Add back an otc antihistamine like loratadine/Claritin or fexofenadine/ Allegra.   This should help the allergic nose and also the hives.  Order- lab- ACE level, CMET, Magnesium level-     Dx sarcoid, cramps  Try a juice -glass of tonic water, flavored with orange juice or tomato juice, once daily- see if that helps the cramps

## 2011-02-17 NOTE — Assessment & Plan Note (Signed)
Nonspecific new complaint today. She is still taking her alprazolam. We can check chemistry 4 potassium.  Try tonic water for quinine.

## 2011-02-17 NOTE — Assessment & Plan Note (Signed)
Pepcid helps with the itching but has not stopped occasional hive.

## 2011-02-23 NOTE — Progress Notes (Signed)
Quick Note:  Left message to call back. ______ 

## 2011-02-24 ENCOUNTER — Telehealth: Payer: Self-pay | Admitting: Internal Medicine

## 2011-02-24 NOTE — Telephone Encounter (Signed)
Called, spoke with pt. I informed her CDY recs she discuss this with her PCP.  She verbalized understanding of this and voiced no further questions/concerns at this time.

## 2011-02-24 NOTE — Telephone Encounter (Signed)
Notes Recorded by Reynaldo Minium, CMA on 02/23/2011 at 1:54 PM Left message to call back. ------  Notes Recorded by Waymon Budge, MD on 02/15/2011 at 8:56 PM Normal lab results- including magnesium and potassium. These aren't the cause of her cramps. The test for sarcoid activity is in the normal range, making active sarcoid unlikely.    Called, spoke with pt.  I informed her of lab results as stated by CDY.  She verbalized understanding of this, but states she is still cramping "all over my body."  States she has tried [facility] and tylenol for this without relief.  Would like to know what she is to do now for this pain.  Dr. Maple Hudson, pls advise.  Thanks!

## 2011-02-24 NOTE — Progress Notes (Signed)
Quick Note:  Spoke with pt. She is aware of labs results as stated by Dr. Maple Hudson. She verbalized understanding of this. She had further questions regarding the cramping. Please see phone message from 02/24/11 for additional information. ______

## 2011-02-24 NOTE — Telephone Encounter (Signed)
Per CY:  Will need to discuss this with her pcp

## 2011-03-28 ENCOUNTER — Ambulatory Visit: Payer: Managed Care, Other (non HMO) | Admitting: Internal Medicine

## 2011-04-19 ENCOUNTER — Ambulatory Visit: Admit: 2011-04-19 | Payer: Self-pay | Admitting: Obstetrics & Gynecology

## 2011-04-19 SURGERY — ROBOTIC ASSISTED TOTAL HYSTERECTOMY
Anesthesia: General

## 2011-05-10 ENCOUNTER — Encounter (HOSPITAL_COMMUNITY): Payer: Self-pay | Admitting: Pharmacist

## 2011-05-15 ENCOUNTER — Encounter (HOSPITAL_COMMUNITY)
Admission: RE | Admit: 2011-05-15 | Discharge: 2011-05-15 | Disposition: A | Discharge status: Home / Self Care | Payer: Managed Care, Other (non HMO) | Source: Ambulatory Visit | Attending: Obstetrics & Gynecology | Admitting: Obstetrics & Gynecology

## 2011-05-15 ENCOUNTER — Other Ambulatory Visit: Payer: Self-pay

## 2011-05-15 ENCOUNTER — Encounter (HOSPITAL_COMMUNITY): Payer: Self-pay

## 2011-05-15 LAB — CBC
HCT: 34.2 % — ABNORMAL LOW (ref 36.0–46.0)
Hemoglobin: 11.2 g/dL — ABNORMAL LOW (ref 12.0–15.0)
MCH: 27.8 pg (ref 26.0–34.0)
MCHC: 32.7 g/dL (ref 30.0–36.0)
RBC: 4.03 MIL/uL (ref 3.87–5.11)

## 2011-05-15 NOTE — Patient Instructions (Signed)
YOUR PROCEDURE IS SCHEDULED ON:05/24/11  ENTER THROUGH THE MAIN ENTRANCE OF [facility] AT:6am  USE DESK PHONE AND DIAL [phone] TO INFORM us OF YOUR ARRIVAL  CALL [phone] IF YOU HAVE ANY QUESTIONS OR PROBLEMS PRIOR TO YOUR ARRIVAL.  REMEMBER: DO NOT EAT OR DRINK AFTER MIDNIGHT :Tuesday  YOU MAY BRUSH YOUR TEETH THE MORNING OF SURGERY   DO NOT WEAR JEWELRY, EYE MAKEUP, LIPSTICK OR DARK FINGERNAIL POLISH DO NOT WEAR LOTIONS DO NOT SHAVE FOR 48 HOURS PRIOR TO SURGERY  YOU WILL NOT BE ALLOWED TO DRIVE YOURSELF HOME.  NAME OF DRIVER: Caryn Bee- fiance

## 2011-05-16 ENCOUNTER — Other Ambulatory Visit: Payer: Self-pay | Admitting: Obstetrics & Gynecology

## 2011-05-24 ENCOUNTER — Other Ambulatory Visit: Payer: Self-pay | Admitting: Obstetrics & Gynecology

## 2011-05-24 ENCOUNTER — Encounter (HOSPITAL_COMMUNITY): Payer: Self-pay | Admitting: *Deleted

## 2011-05-24 ENCOUNTER — Encounter (HOSPITAL_COMMUNITY): Payer: Self-pay | Admitting: Anesthesiology

## 2011-05-24 ENCOUNTER — Encounter (HOSPITAL_COMMUNITY)
Admission: RE | Disposition: A | Discharge status: Home / Self Care | Payer: Self-pay | Source: Ambulatory Visit | Attending: Obstetrics & Gynecology

## 2011-05-24 ENCOUNTER — Ambulatory Visit (HOSPITAL_COMMUNITY): Payer: Managed Care, Other (non HMO) | Admitting: Anesthesiology

## 2011-05-24 ENCOUNTER — Ambulatory Visit (HOSPITAL_COMMUNITY)
Admission: RE | Admit: 2011-05-24 | Discharge: 2011-05-25 | Disposition: A | Discharge status: Home / Self Care | Payer: Managed Care, Other (non HMO) | Source: Ambulatory Visit | Attending: Obstetrics & Gynecology | Admitting: Obstetrics & Gynecology

## 2011-05-24 DIAGNOSIS — E0500 Thyrotoxicosis with diffuse goiter without thyrotoxic crisis or storm: Secondary | ICD-10-CM

## 2011-05-24 DIAGNOSIS — D649 Anemia, unspecified: Secondary | ICD-10-CM | POA: Insufficient documentation

## 2011-05-24 DIAGNOSIS — N920 Excessive and frequent menstruation with regular cycle: Secondary | ICD-10-CM | POA: Insufficient documentation

## 2011-05-24 DIAGNOSIS — N8000 Endometriosis of the uterus, unspecified: Secondary | ICD-10-CM | POA: Insufficient documentation

## 2011-05-24 DIAGNOSIS — N840 Polyp of corpus uteri: Secondary | ICD-10-CM | POA: Insufficient documentation

## 2011-05-24 LAB — CBC
HCT: 34 % — ABNORMAL LOW (ref 36.0–46.0)
Hemoglobin: 11 g/dL — ABNORMAL LOW (ref 12.0–15.0)
MCHC: 32.4 g/dL (ref 30.0–36.0)
MCV: 84.6 fL (ref 78.0–100.0)
RDW: 13.1 % (ref 11.5–15.5)
WBC: 5.1 10*3/uL (ref 4.0–10.5)

## 2011-05-24 LAB — TYPE AND SCREEN
ABO/RH(D): O POS
Antibody Screen: NEGATIVE

## 2011-05-24 LAB — TSH: TSH: <0.008 u[IU]/mL — ABNORMAL LOW (ref 0.350–4.500)

## 2011-05-24 SURGERY — ROBOTIC ASSISTED TOTAL HYSTERECTOMY
Anesthesia: General | Wound class: Clean Contaminated

## 2011-05-24 MED ORDER — PROPOFOL 10 MG/ML IV EMUL
INTRAVENOUS | Status: AC
  Filled 2011-05-24: qty 40

## 2011-05-24 MED ORDER — DEXAMETHASONE SODIUM PHOSPHATE 10 MG/ML IJ SOLN
INTRAMUSCULAR | Status: AC
  Filled 2011-05-24: qty 1

## 2011-05-24 MED ORDER — ROCURONIUM BROMIDE 50 MG/5ML IV SOLN
INTRAVENOUS | Status: AC
  Filled 2011-05-24: qty 1

## 2011-05-24 MED ORDER — BUPIVACAINE HCL (PF) 0.25 % IJ SOLN
INTRAMUSCULAR | Status: DC | PRN
  Administered 2011-05-24: 30 mL

## 2011-05-24 MED ORDER — FENTANYL CITRATE 0.05 MG/ML IJ SOLN
25.0000 ug | INTRAMUSCULAR | Status: DC | PRN
  Administered 2011-05-24 (×3): 50 ug via INTRAVENOUS

## 2011-05-24 MED ORDER — FENTANYL CITRATE 0.05 MG/ML IJ SOLN
INTRAMUSCULAR | Status: AC
  Administered 2011-05-24: 50 ug via INTRAVENOUS
  Filled 2011-05-24: qty 2

## 2011-05-24 MED ORDER — GLYCOPYRROLATE 0.2 MG/ML IJ SOLN
INTRAMUSCULAR | Status: DC | PRN
  Administered 2011-05-24: .4 mg via INTRAVENOUS

## 2011-05-24 MED ORDER — GLYCOPYRROLATE 0.2 MG/ML IJ SOLN
INTRAMUSCULAR | Status: AC
  Filled 2011-05-24: qty 2

## 2011-05-24 MED ORDER — ONDANSETRON HCL 4 MG/2ML IJ SOLN
INTRAMUSCULAR | Status: DC | PRN
  Administered 2011-05-24: 4 mg via INTRAVENOUS

## 2011-05-24 MED ORDER — NEOSTIGMINE METHYLSULFATE 1 MG/ML IJ SOLN
INTRAMUSCULAR | Status: DC | PRN
  Administered 2011-05-24: 2 mg via INTRAVENOUS

## 2011-05-24 MED ORDER — LACTATED RINGERS IV SOLN
INTRAVENOUS | Status: DC
  Administered 2011-05-24 (×2): via INTRAVENOUS

## 2011-05-24 MED ORDER — ONDANSETRON HCL 4 MG/2ML IJ SOLN
INTRAMUSCULAR | Status: AC
  Filled 2011-05-24: qty 2

## 2011-05-24 MED ORDER — SUFENTANIL CITRATE 50 MCG/ML IV SOLN
INTRAVENOUS | Status: AC
  Filled 2011-05-24: qty 1

## 2011-05-24 MED ORDER — CEFAZOLIN SODIUM 1-5 GM-% IV SOLN
1.0000 g | INTRAVENOUS | Status: AC
  Administered 2011-05-24: 1 g via INTRAVENOUS

## 2011-05-24 MED ORDER — ARTIFICIAL TEARS OP OINT
TOPICAL_OINTMENT | OPHTHALMIC | Status: DC | PRN
  Administered 2011-05-24: 1 via OPHTHALMIC

## 2011-05-24 MED ORDER — HYDROMORPHONE HCL PF 1 MG/ML IJ SOLN
1.0000 mg | INTRAMUSCULAR | Status: DC | PRN
  Administered 2011-05-24: 1 mg via INTRAMUSCULAR
  Filled 2011-05-24: qty 1

## 2011-05-24 MED ORDER — ALPRAZOLAM 0.5 MG PO TABS
0.5000 mg | ORAL_TABLET | Freq: Three times a day (TID) | ORAL | Status: DC | PRN
  Administered 2011-05-24: 0.5 mg via ORAL
  Filled 2011-05-24: qty 1

## 2011-05-24 MED ORDER — PROPOFOL 10 MG/ML IV EMUL
INTRAVENOUS | Status: DC | PRN
  Administered 2011-05-24 (×3): 50 mg via INTRAVENOUS
  Administered 2011-05-24: 150 mg via INTRAVENOUS

## 2011-05-24 MED ORDER — DEXAMETHASONE SODIUM PHOSPHATE 10 MG/ML IJ SOLN
INTRAMUSCULAR | Status: DC | PRN
  Administered 2011-05-24: 10 mg via INTRAVENOUS

## 2011-05-24 MED ORDER — SUFENTANIL CITRATE 50 MCG/ML IV SOLN
INTRAVENOUS | Status: DC | PRN
  Administered 2011-05-24: 5 ug via INTRAVENOUS
  Administered 2011-05-24: 15 ug via INTRAVENOUS
  Administered 2011-05-24: 5 ug via INTRAVENOUS
  Administered 2011-05-24: 15 ug via INTRAVENOUS
  Administered 2011-05-24: 10 ug via INTRAVENOUS

## 2011-05-24 MED ORDER — LIDOCAINE HCL (CARDIAC) 20 MG/ML IV SOLN
INTRAVENOUS | Status: DC | PRN
  Administered 2011-05-24: 60 mg via INTRAVENOUS

## 2011-05-24 MED ORDER — LIDOCAINE HCL (CARDIAC) 20 MG/ML IV SOLN
INTRAVENOUS | Status: AC
  Filled 2011-05-24: qty 5

## 2011-05-24 MED ORDER — KETOROLAC TROMETHAMINE 30 MG/ML IJ SOLN
INTRAMUSCULAR | Status: DC | PRN
  Administered 2011-05-24: 30 mg via INTRAVENOUS

## 2011-05-24 MED ORDER — METOPROLOL TARTRATE 1 MG/ML IV SOLN
5.0000 mg | INTRAVENOUS | Status: DC
  Filled 2011-05-24: qty 5

## 2011-05-24 MED ORDER — OXYCODONE-ACETAMINOPHEN 5-325 MG PO TABS
1.0000 | ORAL_TABLET | ORAL | Status: DC | PRN
  Administered 2011-05-24 – 2011-05-25 (×5): 2 via ORAL
  Administered 2011-05-25: 1 via ORAL
  Filled 2011-05-24 (×4): qty 2
  Filled 2011-05-24: qty 1
  Filled 2011-05-24: qty 2

## 2011-05-24 MED ORDER — FENTANYL CITRATE 0.05 MG/ML IJ SOLN
50.0000 ug | Freq: Once | INTRAMUSCULAR | Status: AC
  Administered 2011-05-24: 50 ug via INTRAVENOUS

## 2011-05-24 MED ORDER — MIDAZOLAM HCL 5 MG/5ML IJ SOLN
INTRAMUSCULAR | Status: DC | PRN
  Administered 2011-05-24: 2 mg via INTRAVENOUS

## 2011-05-24 MED ORDER — NEOSTIGMINE METHYLSULFATE 1 MG/ML IJ SOLN
INTRAMUSCULAR | Status: AC
  Filled 2011-05-24: qty 10

## 2011-05-24 MED ORDER — KETOROLAC TROMETHAMINE 30 MG/ML IJ SOLN
INTRAMUSCULAR | Status: AC
  Filled 2011-05-24: qty 1

## 2011-05-24 MED ORDER — ACETAMINOPHEN 10 MG/ML IV SOLN
1000.0000 mg | Freq: Once | INTRAVENOUS | Status: AC
  Administered 2011-05-24: 1000 mg via INTRAVENOUS
  Filled 2011-05-24: qty 100

## 2011-05-24 MED ORDER — BUPIVACAINE HCL (PF) 0.25 % IJ SOLN
INTRAMUSCULAR | Status: AC
  Filled 2011-05-24: qty 30

## 2011-05-24 MED ORDER — METOPROLOL SUCCINATE ER 50 MG PO TB24
50.0000 mg | ORAL_TABLET | Freq: Every day | ORAL | Status: DC
  Administered 2011-05-25: 50 mg via ORAL
  Filled 2011-05-24 (×2): qty 1

## 2011-05-24 MED ORDER — MIDAZOLAM HCL 2 MG/2ML IJ SOLN
INTRAMUSCULAR | Status: AC
  Filled 2011-05-24: qty 2

## 2011-05-24 MED ORDER — LACTATED RINGERS IV SOLN
INTRAVENOUS | Status: DC
  Administered 2011-05-24 – 2011-05-25 (×2): via INTRAVENOUS

## 2011-05-24 MED ORDER — ACETAMINOPHEN 10 MG/ML IV SOLN
1000.0000 mg | Freq: Once | INTRAVENOUS | Status: DC | PRN
  Filled 2011-05-24: qty 100

## 2011-05-24 MED ORDER — ROCURONIUM BROMIDE 100 MG/10ML IV SOLN
INTRAVENOUS | Status: DC | PRN
  Administered 2011-05-24: 50 mg via INTRAVENOUS
  Administered 2011-05-24: 10 mg via INTRAVENOUS

## 2011-05-24 MED ORDER — IBUPROFEN 600 MG PO TABS
600.0000 mg | ORAL_TABLET | Freq: Four times a day (QID) | ORAL | Status: DC | PRN
  Administered 2011-05-24 – 2011-05-25 (×4): 600 mg via ORAL
  Filled 2011-05-24 (×5): qty 1

## 2011-05-24 SURGICAL SUPPLY — 64 items
ADH SKN CLS APL DERMABOND .7 (GAUZE/BANDAGES/DRESSINGS) ×2
BAG URINE DRAINAGE (UROLOGICAL SUPPLIES) ×2 IMPLANT
BARRIER ADHS 3X4 INTERCEED (GAUZE/BANDAGES/DRESSINGS) ×2 IMPLANT
BLADE LAPAROSCOPIC MORCELL KIT (BLADE) IMPLANT
BLADELESS LONG 8MM (BLADE) IMPLANT
BRR ADH 4X3 ABS CNTRL BYND (GAUZE/BANDAGES/DRESSINGS) ×1
CABLE HIGH FREQUENCY MONO STRZ (ELECTRODE) ×2 IMPLANT
CATH FOLEY 3WAY  5CC 16FR (CATHETERS) ×1
CATH FOLEY 3WAY 5CC 16FR (CATHETERS) ×1 IMPLANT
CLOTH BEACON ORANGE TIMEOUT ST (SAFETY) ×2 IMPLANT
CONT PATH 16OZ SNAP LID 3702 (MISCELLANEOUS) ×2 IMPLANT
COVER MAYO STAND STRL (DRAPES) ×2 IMPLANT
COVER TABLE BACK 60X90 (DRAPES) ×4 IMPLANT
COVER TIP SHEARS 8 DVNC (MISCELLANEOUS) ×1 IMPLANT
COVER TIP SHEARS 8MM DA VINCI (MISCELLANEOUS) ×1
DECANTER SPIKE VIAL GLASS SM (MISCELLANEOUS) ×2 IMPLANT
DERMABOND ADVANCED (GAUZE/BANDAGES/DRESSINGS) ×2
DERMABOND ADVANCED .7 DNX12 (GAUZE/BANDAGES/DRESSINGS) ×2 IMPLANT
DRAPE HUG U DISPOSABLE (DRAPE) ×2 IMPLANT
DRAPE LG THREE QUARTER DISP (DRAPES) ×4 IMPLANT
DRAPE MONITOR DA VINCI (DRAPE) IMPLANT
DRAPE WARM FLUID 44X44 (DRAPE) ×2 IMPLANT
ELECT REM PT RETURN 9FT ADLT (ELECTROSURGICAL) ×2
ELECTRODE REM PT RTRN 9FT ADLT (ELECTROSURGICAL) ×1 IMPLANT
EVACUATOR SMOKE 8.L (FILTER) ×2 IMPLANT
GAUZE VASELINE 3X9 (GAUZE/BANDAGES/DRESSINGS) IMPLANT
GLOVE BIO SURGEON STRL SZ 6.5 (GLOVE) ×4 IMPLANT
GOWN STRL REIN XL XLG (GOWN DISPOSABLE) ×12 IMPLANT
IV STOPCOCK 4 WAY 40  W/Y SET (IV SOLUTION)
IV STOPCOCK 4 WAY 40 W/Y SET (IV SOLUTION) IMPLANT
KIT ACCESSORY DA VINCI DISP (KITS) ×1
KIT ACCESSORY DVNC DISP (KITS) ×1 IMPLANT
KIT DISP ACCESSORY 4 ARM (KITS) IMPLANT
NDL INSUFFLATION 14GA 120MM (NEEDLE) IMPLANT
NEEDLE HYPO 22GX1.5 SAFETY (NEEDLE) IMPLANT
NEEDLE INSUFFLATION 14GA 120MM (NEEDLE) IMPLANT
OCCLUDER COLPOPNEUMO (BALLOONS) IMPLANT
PACK LAVH (CUSTOM PROCEDURE TRAY) ×2 IMPLANT
PAD PREP 24X48 CUFFED NSTRL (MISCELLANEOUS) ×4 IMPLANT
PLUG CATH AND CAP STER (CATHETERS) ×2 IMPLANT
POSITIONER SURGICAL ARM (MISCELLANEOUS) ×4 IMPLANT
SET IRRIG TUBING LAPAROSCOPIC (IRRIGATION / IRRIGATOR) ×4 IMPLANT
SOLUTION ELECTROLUBE (MISCELLANEOUS) ×2 IMPLANT
SPONGE LAP 18X18 X RAY DECT (DISPOSABLE) IMPLANT
SUT VIC AB 0 CT1 27 (SUTURE) ×10
SUT VIC AB 0 CT1 27XBRD ANTBC (SUTURE) ×5 IMPLANT
SUT VIC AB 4-0 PS2 27 (SUTURE) ×4 IMPLANT
SUT VICRYL 0 27 CT2 27 ABS (SUTURE) IMPLANT
SUT VICRYL 0 UR6 27IN ABS (SUTURE) ×4 IMPLANT
SYR 50ML LL SCALE MARK (SYRINGE) ×2 IMPLANT
SYSTEM CONVERTIBLE TROCAR (TROCAR) IMPLANT
TIP UTERINE 5.1X6CM LAV DISP (MISCELLANEOUS) IMPLANT
TIP UTERINE 6.7X10CM GRN DISP (MISCELLANEOUS) IMPLANT
TIP UTERINE 6.7X6CM WHT DISP (MISCELLANEOUS) IMPLANT
TIP UTERINE 6.7X8CM BLUE DISP (MISCELLANEOUS) IMPLANT
TOWEL OR 17X24 6PK STRL BLUE (TOWEL DISPOSABLE) ×6 IMPLANT
TROCAR 12M 150ML BLUNT (TROCAR) IMPLANT
TROCAR DISP BLADELESS 8 DVNC (TROCAR) ×1 IMPLANT
TROCAR DISP BLADELESS 8MM (TROCAR) ×1
TROCAR XCEL 12X100 BLDLESS (ENDOMECHANICALS) ×2 IMPLANT
TROCAR XCEL NON-BLD 5MMX100MML (ENDOMECHANICALS) ×2 IMPLANT
TUBING FILTER THERMOFLATOR (ELECTROSURGICAL) ×4 IMPLANT
WARMER LAPAROSCOPE (MISCELLANEOUS) ×2 IMPLANT
WATER STERILE IRR 1000ML POUR (IV SOLUTION) ×6 IMPLANT

## 2011-05-24 NOTE — Op Note (Signed)
05/24/2011  10:07 AM  PATIENT:  [name]  46 y.o. female  PRE-OPERATIVE DIAGNOSIS:  Menorrhagia; Anemia  POST-OPERATIVE DIAGNOSIS:  Menorrhagia; Anemia  PROCEDURE:  Procedure(s): ROBOTIC ASSISTED TOTAL HYSTERECTOMY  SURGEON:  Surgeon(s): Genia Del, MD  ASSISTANTS: Arlan Organ   ANESTHESIA:   general  PROCEDURE:  Under general anesthesia with endotracheal intubation. The patient is in lithotomy position. She is prepped with ChloraPrep on the abdomen and Betadine on the suprapubic vulvar and vaginal areas. She is draped as usual. The vaginal exam reveals a retroverted uterus mobile no adnexal mass. This weighted speculum was introduced in the vagina the anterior lip of the cervix is grasped with a tenaculum and.h and of the cervix is done with an Hegar dilator, hysterometry is 8 cm.  A medium Koe-ring with a #8 roomy is output in place without difficulty.  The tenaculum and weighted speculum are removed. A Foley is put in place in the bladder. The supraumbilical area is infiltrated with Marcaine one quarter plain. A 1 cm incision is made with a scalpel. The aponeurosis is opened with the Mayo scissors. And the parietal peritoneum is opened longitudinally the finger. A pursestring stitch of Vicryl 0 is output in place on the aponeurosis. That Roseanne Reno is introduced at that level with the camera. The uterus appears congested, adenomyosis is probably present, but no other pathology is seen in the abdomino-pelvic cavities.  Both ovaries are normal to inspection, both tubes present a status post tubal ligation.  A semicircular configuration is used for port placement with the assistant port on the upper left, 2 robotic ports on the right and one robotic port on the lower left.  Marcaine is infiltrated at each site, an incision is made with a scalpel and all trochars are inserted under direct vision.  The robot his docked on the right the patient was put into Trendelenburg but not to the  maximum.  The Endo Shears scissor is inserted in the right hand and the PK in the left and the pro grasp in the third arm.  We then start to the console. Both ureters were well identified and in normal anatomy position. We start on the left side cauterizing and sectioning successively the round ligament, the tube and the utero-ovarian ligament.  We proceed exactly the same way on the right side. We then opened the anterior visceral peritoneum with the Endo Shears scissor and retract the bladder downwards. We then cauterized the right uterine artery and then the left uterine artery and sectioned. The vaginal cuff was opened on top of the Koe-ring with the tip of the Endo Shears scissor starting anteriorly and finishing posteriorly.  The uterus with the cervix were completely detached and passed vaginal. The specimen was sent to pathology. We then completed hemostasis on the vaginal vault with the PK.  The instruments were switched to the cutting needle driver in the right hand, the regular needle driver in the left hand and the PK in the third arm.  The vaginal vault was closed with a running suture of V. LOC 9 inches, we started at the right angle, finished on the left angle and came back on our steps for about 3 bites. Each bite included the vaginal mucosa.  The needle was parked on the right abdominal wall. Hemostasis was adequate at all sites. Irrigation and suction was performed. We therefore removed robotic instruments and undocked the robot. We continued by laparoscopy, switching to the 8mm camera.  The needle was removed.  Hemostasis was confirmed again and irrigation suction was done.  All instruments were removed. The trochars were removed under direct vision. The CO2 was evacuated. We closed the supraumbilical incision by attaching the pursestring stitch on the aponeurosis. We then closed each incision with a subcuticular stitch of Vicryl 4-0. And applied Dermabond. Hemostasis was adequate at all levels.  The occluder was removed from the vagina. The patient was brought to recovery room in good stable status.  ESTIMATED BLOOD LOSS:  75 cc   Intake/Output Summary (Last 24 hours) at 05/24/11 1007 Last data filed at 05/24/11 1003  Gross per 24 hour  Intake   1400 ml  Output    235 ml  Net   1165 ml     BLOOD ADMINISTERED:none   LOCAL MEDICATIONS USED:  MARCAINE 20 CC  SPECIMEN:  Source of Specimen:  Uterus with cervix  DISPOSITION OF SPECIMEN:  PATHOLOGY  COUNTS:  YES complete  PLAN OF CARE: Transfer to PACU    Genia Del MD.  05/24/2011  At 10:09 am

## 2011-05-24 NOTE — Anesthesia Preprocedure Evaluation (Signed)
Anesthesia Evaluation  Patient identified by MRN, date of birth, ID band Patient awake    Reviewed: Allergy & Precautions, H&P , NPO status , Patient's Chart, lab work & pertinent test results, reviewed documented beta blocker date and time   Airway Mallampati: I TM Distance: >3 FB Neck ROM: full    Dental  (+) Teeth Intact   Pulmonary  clear to auscultation  Pulmonary exam normal       Cardiovascular neg cardio ROS     Neuro/Psych PSYCHIATRIC DISORDERS Anxiety Depression    GI/Hepatic negative GI ROS, Neg liver ROS,   Endo/Other  Hyperthyroidism   Renal/GU negative Renal ROS  Genitourinary negative   Musculoskeletal negative musculoskeletal ROS (+)   Abdominal Normal abdominal exam  (+)   Peds negative pediatric ROS (+)  Hematology negative hematology ROS (+)   Anesthesia Other Findings   Reproductive/Obstetrics negative OB ROS                           Anesthesia Physical Anesthesia Plan  ASA: II  Anesthesia Plan: General   Post-op Pain Management:    Induction: Intravenous  Airway Management Planned: Oral ETT  Additional Equipment:   Intra-op Plan:   Post-operative Plan: Extubation in OR  Informed Consent: I have reviewed the patients History and Physical, chart, labs and discussed the procedure including the risks, benefits and alternatives for the proposed anesthesia with the patient or authorized representative who has indicated his/her understanding and acceptance.   Dental Advisory Given  Plan Discussed with: CRNA  Anesthesia Plan Comments:         Anesthesia Quick Evaluation

## 2011-05-24 NOTE — Transfer of Care (Signed)
Immediate Anesthesia Transfer of Care Note  Patient: [name]  Procedure(s) Performed:  ROBOTIC ASSISTED TOTAL HYSTERECTOMY  Patient Location: PACU  Anesthesia Type: General  Level of Consciousness: alert  and oriented  Airway & Oxygen Therapy: Patient Spontanous Breathing and Patient connected to nasal cannula oxygen  Post-op Assessment: Report given to PACU RN and Post -op Vital signs reviewed and stable  Post vital signs: stable  Complications: No apparent anesthesia complications

## 2011-05-24 NOTE — H&P (Signed)
[name] is an 46 y.o. female G84P4  RP:  Refractory menometrorrhagia for [facility]  Pertinent Gynecological History: Menses: flow is excessive with use of many pads or tampons on heaviest days Bleeding: intermenstrual bleeding and dysfunctional uterine bleeding Contraception: BT/S Blood transfusions: none Sexually transmitted diseases: no past history Previous GYN Procedures: BT/S  Last mammogram: normal Date: 2012 Last pap: normal Date: 2012    Menstrual History:  No LMP recorded.    Past Medical History  Diagnosis Date  . Menometrorrhagia   . Depression   . Granulomatous lung disease   . Anxiety   . Migraines   . Grave's disease     Graves Disease    Past Surgical History  Procedure Date  . Bilateral tubal 1993    Family History  Problem Relation Age of Onset  . Hypertension Brother   . Hypertension Father   . Hypertension Mother   . Heart attack Mother   . Allergies Father   . Allergies Other     children    Social History:  reports that she has been smoking Cigarettes.  She has a 16 pack-year smoking history. She has never used smokeless tobacco. She reports that she drinks alcohol. She reports that she does not use illicit drugs.  Allergies: No Known Allergies  Prescriptions prior to admission  Medication Sig Dispense Refill  . metoprolol (TOPROL-XL) 50 MG 24 hr tablet Take 1 tablet (50 mg total) by mouth daily.  30 tablet  5  . ALPRAZolam (XANAX) 0.5 MG tablet Take 1 tablet (0.5 mg total) by mouth 3 (three) times daily as needed for sleep or anxiety.  30 tablet  0  . HYDROcodone-acetaminophen (VICODIN) 2.5-500 MG per tablet Take 1 tablet by mouth 3 (three) times daily as needed. For pain      . hydrOXYzine (VISTARIL) 100 MG capsule One cap at bedtime as needed for sleep       . triamcinolone cream (KENALOG) 0.1 % Apply 1 application topically 2 (two) times daily as needed. For hives        Blood pressure 113/73, pulse 81, temperature 97.9  F (36.6 C), temperature source Oral, resp. rate 18, SpO2 100.00%.   Results for orders placed during the hospital encounter of 05/24/11 (from the past 24 hour(s))  PREGNANCY, URINE     Status: Normal   Collection Time   05/24/11  6:00 AM      Component Value Range   Preg Test, Ur NEGATIVE    CBC     Status: Abnormal   Collection Time   05/24/11  6:15 AM      Component Value Range   WBC 5.1  4.0 - 10.5 (K/uL)   RBC 4.02  3.87 - 5.11 (MIL/uL)   Hemoglobin 11.0 (*) 12.0 - 15.0 (g/dL)   HCT 16.1 (*) 09.6 - 46.0 (%)   MCV 84.6  78.0 - 100.0 (fL)   MCH 27.4  26.0 - 34.0 (pg)   MCHC 32.4  30.0 - 36.0 (g/dL)   RDW 04.5  40.9 - 81.1 (%)   Platelets 297  150 - 400 (K/uL)    No results found.  Assessment/Plan: Refractory menometrorrhagia for [facility].  Surgery and risks reviewed.  [name],[name] 05/24/2011, 7:29 AM

## 2011-05-24 NOTE — Anesthesia Postprocedure Evaluation (Signed)
Anesthesia Post Note  Patient: [name]  Procedure(s) Performed:  ROBOTIC ASSISTED TOTAL HYSTERECTOMY  Anesthesia type: General  Patient location: PACU  Post pain: Pain level controlled  Post assessment: Post-op Vital signs reviewed  Last Vitals:  Filed Vitals:   05/24/11 1145  BP:   Pulse: 83  Temp: 36.8 C  Resp: 16    Post vital signs: Reviewed  Level of consciousness: sedated  Complications: No apparent anesthesia complications

## 2011-05-25 LAB — CBC
Hemoglobin: 9.2 g/dL — ABNORMAL LOW (ref 12.0–15.0)
MCHC: 32.3 g/dL (ref 30.0–36.0)
RDW: 13.1 % (ref 11.5–15.5)
WBC: 11.4 10*3/uL — ABNORMAL HIGH (ref 4.0–10.5)

## 2011-05-25 MED ORDER — OXYCODONE-ACETAMINOPHEN 7.5-325 MG PO TABS: 1.0000 | ORAL_TABLET | ORAL | Status: AC | PRN

## 2011-05-25 NOTE — Discharge Summary (Signed)
Physician Discharge Summary  Patient ID: [name] MRN: [id number] DOB/AGE: 1965/05/30 46 y.o.  Admit date: 05/24/2011 Discharge date: 05/25/2011  Admission Diagnoses: Menorrhagia, Anemia  Discharge Diagnoses: Menorrhagia, Anemia        Active Problems:  * No active hospital problems. *    Discharged Condition: good  Hospital Course: good post op  Consults: None  Treatments: surgery: Robotic TLH  Disposition: Home   Medication List  As of 05/25/2011  1:38 PM   STOP taking these medications         HYDROcodone-acetaminophen 2.5-500 MG per tablet         TAKE these medications         ALPRAZolam 0.5 MG tablet   Commonly known as: XANAX   Take 1 tablet (0.5 mg total) by mouth 3 (three) times daily as needed for sleep or anxiety.      hydrOXYzine 100 MG capsule   Commonly known as: VISTARIL   One cap at bedtime as needed for sleep      metoprolol succinate 50 MG 24 hr tablet   Commonly known as: TOPROL-XL   Take 1 tablet (50 mg total) by mouth daily.      oxyCODONE-acetaminophen 7.5-325 MG per tablet   Commonly known as: PERCOCET   Take 1 tablet by mouth every 4 (four) hours as needed for pain.      triamcinolone cream 0.1 %   Commonly known as: KENALOG   Apply 1 application topically 2 (two) times daily as needed. For hives           Follow-up Information    Follow up with Maysie Parkhill,[name], MD in 3 weeks.   Contact information:   [address] [place] [address] [phone]          SignedGenia Del, MD 05/25/2011, 1:38 PM

## 2011-05-25 NOTE — Progress Notes (Signed)
1 Day Post-Op Procedure(s) (LRB): ROBOTIC ASSISTED TOTAL HYSTERECTOMY (N/A)  Subjective: Patient reports that pain is well managed.  Tolerating normal diet as tolerated  diet without difficulty. No nausea / vomiting.  Ambulating and voiding.  Objective: BP 109/79  Pulse 89  Temp(Src) 97.7 F (36.5 C) (Oral)  Resp 18  Ht 5\' 8"  (1.727 m)  Wt 58.06 kg (128 lb)  BMI 19.46 kg/m2  SpO2 98% Lungs: clear Heart: normal rate and rhythm Abdomen:soft and appropriately tender Extremities: Homans sign is negative, no sign of DVT Incision: healing well  Assessment: s/p Procedure(s): ROBOTIC ASSISTED TOTAL HYSTERECTOMY: stable  Plan: Discharge home  LOS: 1 day    Rachell Druckenmiller,[name] 05/25/2011, 1:33 PM

## 2011-11-20 ENCOUNTER — Ambulatory Visit: Payer: Managed Care, Other (non HMO) | Admitting: Endocrinology

## 2011-11-28 ENCOUNTER — Ambulatory Visit: Payer: Managed Care, Other (non HMO) | Admitting: Endocrinology

## 2014-07-14 ENCOUNTER — Emergency Department (HOSPITAL_COMMUNITY): Payer: Managed Care, Other (non HMO)

## 2014-07-14 ENCOUNTER — Encounter (HOSPITAL_COMMUNITY): Payer: Self-pay | Admitting: Emergency Medicine

## 2014-07-14 ENCOUNTER — Emergency Department (HOSPITAL_COMMUNITY)
Admission: EM | Admit: 2014-07-14 | Discharge: 2014-07-14 | Disposition: A | Discharge status: Home / Self Care | Payer: Managed Care, Other (non HMO) | Attending: Emergency Medicine | Admitting: Emergency Medicine

## 2014-07-14 DIAGNOSIS — Z79899 Other long term (current) drug therapy: Secondary | ICD-10-CM | POA: Insufficient documentation

## 2014-07-14 DIAGNOSIS — Z720 Tobacco use: Secondary | ICD-10-CM | POA: Insufficient documentation

## 2014-07-14 DIAGNOSIS — Z8639 Personal history of other endocrine, nutritional and metabolic disease: Secondary | ICD-10-CM

## 2014-07-14 DIAGNOSIS — F419 Anxiety disorder, unspecified: Secondary | ICD-10-CM | POA: Insufficient documentation

## 2014-07-14 DIAGNOSIS — Z8709 Personal history of other diseases of the respiratory system: Secondary | ICD-10-CM | POA: Insufficient documentation

## 2014-07-14 DIAGNOSIS — G43909 Migraine, unspecified, not intractable, without status migrainosus: Secondary | ICD-10-CM | POA: Insufficient documentation

## 2014-07-14 DIAGNOSIS — E0500 Thyrotoxicosis with diffuse goiter without thyrotoxic crisis or storm: Secondary | ICD-10-CM | POA: Insufficient documentation

## 2014-07-14 DIAGNOSIS — Z8742 Personal history of other diseases of the female genital tract: Secondary | ICD-10-CM | POA: Insufficient documentation

## 2014-07-14 LAB — BASIC METABOLIC PANEL
ANION GAP: 5 (ref 5–15)
BUN: 17 mg/dL (ref 6–23)
CHLORIDE: 109 mmol/L (ref 96–112)
CO2: 24 mmol/L (ref 19–32)
CREATININE: 0.66 mg/dL (ref 0.50–1.10)
Calcium: 9.1 mg/dL (ref 8.4–10.5)
GFR calc non Af Amer: >90 mL/min (ref >90)
Glucose, Bld: 115 mg/dL — ABNORMAL HIGH (ref 70–99)
Potassium: 3.8 mmol/L (ref 3.5–5.1)
SODIUM: 138 mmol/L (ref 135–145)

## 2014-07-14 LAB — I-STAT TROPONIN, ED: TROPONIN I, POC: 0 ng/mL (ref 0.00–0.08)

## 2014-07-14 LAB — CBC WITH DIFFERENTIAL/PLATELET
BASOS PCT: 0 % (ref 0–1)
Basophils Absolute: 0 10*3/uL (ref 0.0–0.1)
EOS ABS: 0.2 10*3/uL (ref 0.0–0.7)
EOS PCT: 4 % (ref 0–5)
HCT: 34.6 % — ABNORMAL LOW (ref 36.0–46.0)
Hemoglobin: 11.5 g/dL — ABNORMAL LOW (ref 12.0–15.0)
LYMPHS ABS: 1.8 10*3/uL (ref 0.7–4.0)
Lymphocytes Relative: 42 % (ref 12–46)
MCH: 27.9 pg (ref 26.0–34.0)
MCHC: 33.2 g/dL (ref 30.0–36.0)
MCV: 84 fL (ref 78.0–100.0)
Monocytes Absolute: 0.3 10*3/uL (ref 0.1–1.0)
Monocytes Relative: 8 % (ref 3–12)
NEUTROS PCT: 46 % (ref 43–77)
Neutro Abs: 2 10*3/uL (ref 1.7–7.7)
PLATELETS: 273 10*3/uL (ref 150–400)
RBC: 4.12 MIL/uL (ref 3.87–5.11)
RDW: 13.3 % (ref 11.5–15.5)
WBC: 4.3 10*3/uL (ref 4.0–10.5)

## 2014-07-14 LAB — TSH: TSH: 0.011 u[IU]/mL — ABNORMAL LOW (ref 0.350–4.500)

## 2014-07-14 NOTE — ED Notes (Signed)
Pt was at 97-100% oximetry while walking

## 2014-07-14 NOTE — ED Notes (Signed)
Pt sts palpitations starting this am with some pain into neck and left side of face; pt sts hx of Graves disease

## 2014-07-14 NOTE — Discharge Instructions (Signed)
Hyperthyroidism  The thyroid is a large gland located in the lower front part of your neck. The thyroid helps control metabolism. Metabolism is how your body uses food. It controls metabolism with the hormone thyroxine. When the thyroid is overactive, it produces too much hormone. When this happens, these following problems may occur:   · Nervousness  · Heat intolerance  · Weight loss (in spite of increase food intake)  · Diarrhea  · Change in hair or skin texture  · Palpitations (heart skipping or having extra beats)  · Tachycardia (rapid heart rate)  · Loss of menstruation (amenorrhea)  · Shaking of the hands  CAUSES  · Grave's Disease (the immune system attacks the thyroid gland). This is the most common cause.  · Inflammation of the thyroid gland.  · Tumor (usually benign) in the thyroid gland or elsewhere.  · Excessive use of thyroid medications (both prescription and 'natural').  · Excessive ingestion of Iodine.  DIAGNOSIS   To prove hyperthyroidism, your caregiver may do blood tests and ultrasound tests. Sometimes the signs are hidden. It may be necessary for your caregiver to watch this illness with blood tests, either before or after diagnosis and treatment.  TREATMENT  Short-term treatment  There are several treatments to control symptoms. Drugs called beta blockers may give some relief. Drugs that decrease hormone production will provide temporary relief in many people. These measures will usually not give permanent relief.  Definitive therapy  There are treatments available which can be discussed between you and your caregiver which will permanently treat the problem. These treatments range from surgery (removal of the thyroid), to the use of radioactive iodine (destroys the thyroid by radiation), to the use of antithyroid drugs (interfere with hormone synthesis). The first two treatments are permanent and usually successful. They most often require hormone replacement therapy for life. This is because  it is impossible to remove or destroy the exact amount of thyroid required to make a person euthyroid (normal).  HOME CARE INSTRUCTIONS   See your caregiver if the problems you are being treated for get worse. Examples of this would be the problems listed above.  SEEK MEDICAL CARE IF:  Your general condition worsens.  MAKE SURE YOU:   · Understand these instructions.  · Will watch your condition.  · Will get help right away if you are not doing well or get worse.  Document Released: 04/17/2005 Document Revised: 07/10/2011 Document Reviewed: 08/29/2006  ExitCare® Patient Information ©2015 [employer]. This information is not intended to replace advice given to you by your health care provider. Make sure you discuss any questions you have with your health care provider.

## 2014-07-14 NOTE — Progress Notes (Signed)
ED CM consulted by T. Carlota Raspberry concerning f/u for Graves Disease off meds for 2 yrs., Need to establish care with a  PCP. Reviewed record patient has [employer]/ No PCP listed,. Met with patient and SO at bedside verbal consent given by patient to discuss care in the presence of SO, patient confirmed information .  Discussed the importance of f/u and disease management by primary care, patient is agreeable. Discussed [facility] walk-in clinic patient she is agreeable with establishing care. Provided [facility] brochure with information. Offered to assist with f/u patient agreeable, appointment scheduled at the [facility] walk-in clinic 3/18 at 10:30am. Patient verbalized understanding teach back done.,information placed on the AVS. T. Carlota Raspberry updated on disposition plan. No further ED CM needs identified

## 2014-07-14 NOTE — ED Provider Notes (Signed)
CSN: [id number]     Arrival date & time 07/14/14  1345 History   First MD Initiated Contact with Patient 07/14/14 1645     Chief Complaint  Patient presents with  . Palpitations     (Consider location/radiation/quality/duration/timing/severity/associated sxs/prior Treatment) HPI  PCP: Pcp Not In System - has not seen provider at [facility] for over 2 years Blood pressure 134/82, pulse 71, temperature 98.3 F (36.8 C), temperature source Oral, resp. rate 15, last menstrual period 04/23/2011, SpO2 100 %.   VG is a 49 yo female with a history of Graves Disease who presents to the ED today with a chief complaint of heart palpitations, light headedness and heat intolerance. Her Graves disease has not been medically managed for the past 2 years because the patient "felt better". Patient states that her heart palpitations started on Friday and describes it as a pounding feeling. She states that she sleeps on her stomach and that the heart palpitations are keeping her awake at night. She sleeps between 4.5 and 5 hours/night. She denies chest pain, but admits to a nagging feeling in her left chest earlier today. States that she has been SOB for 3 years since being diagnosed with Grave's Disease, but admits that her SOB is getting worse and more frequent. She has been experiencing hot flashes since Friday and states that she "can't cool off". She experiences episodes of sweating every 15 minutes when active and about every hour when resting. She tries to sit down and rest to calm her symptoms. She has a h/o of hysterectomy 4 years ago and has not experienced any s/s of menopause. She complains of feeling light headed since 11:00 this morning. Pt also complains of the feeling of something being stuck in her throat when she swallows. She admits to unintentional weight loss of 15 pounds over the last 6 months and an increased craving for sweet drinks and salty foods.    Past Medical History  Diagnosis  Date  . Menometrorrhagia   . Depression   . Granulomatous lung disease   . Anxiety   . Migraines   . Grave's disease     Graves Disease   Past Surgical History  Procedure Laterality Date  . Bilateral tubal  1993   Family History  Problem Relation Age of Onset  . Hypertension Brother   . Hypertension Father   . Hypertension Mother   . Heart attack Mother   . Allergies Father   . Allergies Other     children   History  Substance Use Topics  . Smoking status: Current Every Day Smoker -- 0.50 packs/day for 32 years    Types: Cigarettes  . Smokeless tobacco: Never Used  . Alcohol Use: Yes     Comment: occasional beer   OB History    No data available     Review of Systems  10 Systems reviewed and are negative for acute change except as noted in the HPI.     Allergies  Review of patient's allergies indicates no known allergies.  Home Medications   Prior to Admission medications   Medication Sig Start Date End Date Taking? Authorizing Provider  hydrOXYzine (VISTARIL) 100 MG capsule One cap at bedtime as needed for sleep  07/20/10   Silverio Decamp, MD  metoprolol (TOPROL-XL) 50 MG 24 hr tablet Take 1 tablet (50 mg total) by mouth daily. 10/27/10 10/27/11  Renato Shin, MD  triamcinolone cream (KENALOG) 0.1 % Apply 1 application topically 2 (two)  times daily as needed. For hives    Historical Provider, MD   BP 139/92 mmHg  Pulse 78  Temp(Src) 98.3 F (36.8 C) (Oral)  Resp 21  SpO2 100%  LMP 04/23/2011 Physical Exam  Constitutional: She appears well-developed and well-nourished. No distress.  HENT:  Head: Normocephalic and atraumatic. Head is without Battle's sign, without abrasion and without contusion.  Right Ear: Tympanic membrane and ear canal normal.  Left Ear: Tympanic membrane and ear canal normal.  Nose: Nose normal.  Mouth/Throat: Uvula is midline and oropharynx is clear and moist.  Eyes: Pupils are equal, round, and reactive to light.  Neck:  Normal range of motion and full passive range of motion without pain. Neck supple. No tracheal tenderness and no spinous process tenderness present. No tracheal deviation and no erythema present.  I do not appreciate any mass or thyroidmegaly  Cardiovascular: Normal rate, regular rhythm and normal pulses.   Pulmonary/Chest: Effort normal and breath sounds normal. No stridor. She has no decreased breath sounds. She has no wheezes.  Abdominal: Soft. Bowel sounds are normal. There is no tenderness. There is no rigidity, no rebound and no guarding.  Neurological: She is alert.  Skin: Skin is warm and dry.  Nursing note and vitals reviewed.   ED Course  Procedures (including critical care time) Labs Review Labs Reviewed  BASIC METABOLIC PANEL - Abnormal; Notable for the following:    Glucose, Bld 115 (*)    All other components within normal limits  CBC WITH DIFFERENTIAL/PLATELET - Abnormal; Notable for the following:    Hemoglobin 11.5 (*)    HCT 34.6 (*)    All other components within normal limits  TSH  T3  T3, FREE  T4, FREE  T4  I-STAT TROPOININ, ED    Imaging Review Dg Chest 2 View  07/14/2014   CLINICAL DATA:  Palpitations with constricted swallowing and shortness of breath 2 days. Smoker.  EXAM: CHEST  2 VIEW  COMPARISON:  05/15/2010 and chest CT 11/17/2010  FINDINGS: Lungs are adequately inflated without consolidation or effusion. Stable emphysematous disease. Cardiomediastinal silhouette is within normal. Calcified AP window lymph node. Remainder the exam is unchanged.  IMPRESSION: No active cardiopulmonary disease.  Emphysematous disease.   Electronically Signed   By: Marin Olp M.D.   On: 07/14/2014 14:34     EKG Interpretation   Date/Time:  Tuesday July 14 2014 13:52:03 EDT Ventricular Rate:  78 PR Interval:  180 QRS Duration: 84 QT Interval:  376 QTC Calculation: 428 R Axis:   74 Text Interpretation:  Normal sinus rhythm Normal ECG When compared with  ECG of  05/15/2011 No significant change was found Confirmed by [facility]   MD, Nunzio Cory [phone]) on 07/14/2014 5:55:59 PM      MDM   Final diagnoses:  H/O Graves' disease   Pt has a h/o graves disease that has not been treated for 2 years. Her symptoms include heart palpitations, diaphoresis and hot flashes, light headedness, weight loss, anxiety and a feeling of something being stuck in her throat. All of these symptoms are consistent with hyperthyroidism. Her labs in the ED were not significant; her troponin was negative, her BMP showed elevated glucose, and her CBC showed a normal white count. Her CXR was normal. Patient ambulated with nurse and had subjective dizziness and palpitations, but no objective findings. She maintained good O2 saturation and was not tachycardic or tachypnic with normal gait. Case manager, Mariann Laster, was consulted. We will draw a TSH, T3  and T4 level here tonight and she will [facility] at 10:30 to review medicines and be started on therapy.   Case discussed with Dr. Thurnell Garbe who is comfortable with this plan. Patient also in agreement and will be at appointment on Friday. Labs pending for appointment on Friday  48 y.o.Adrienne Mocha evaluation in the Emergency Department is complete. It has been determined that no acute conditions requiring further emergency intervention are present at this time. The patient/guardian have been advised of the diagnosis and plan. We have discussed signs and symptoms that warrant return to the ED, such as changes or worsening in symptoms.  Vital signs are stable at discharge. Filed Vitals:   07/14/14 1830  BP: 139/92  Pulse: 78  Temp:   Resp: 21    Patient/guardian has voiced understanding and agreed to follow-up with the PCP or specialist.       Delos Haring, PA-C 07/14/14 [place], DO 07/17/14 1255

## 2014-07-16 LAB — T3, FREE: T3 FREE: 4.5 pg/mL — AB (ref 2.0–4.4)

## 2014-07-17 ENCOUNTER — Ambulatory Visit: Payer: Managed Care, Other (non HMO) | Attending: Family Medicine | Admitting: Family Medicine

## 2014-07-17 VITALS — BP 115/71 | HR 79 | Temp 98.4°F | Resp 16 | Ht 70.0 in | Wt 148.0 lb

## 2014-07-17 DIAGNOSIS — E0500 Thyrotoxicosis with diffuse goiter without thyrotoxic crisis or storm: Secondary | ICD-10-CM | POA: Diagnosis not present

## 2014-07-17 MED ORDER — METHIMAZOLE 5 MG PO TABS: 5.0000 mg | ORAL_TABLET | Freq: Three times a day (TID) | ORAL | Status: DC

## 2014-07-17 NOTE — Progress Notes (Signed)
Patient was told to establish care here and get her medicines for her Graves disease   Pt has a h/o graves disease that has not been treated for 2 years. Her symptoms include heart palpitations, diaphoresis and hot flashes, light headedness, weight loss, anxiety and a feeling of something being stuck in her throat. All of these symptoms are consistent with hyperthyroidism. Her labs in the ED were not significant; her troponin was negative, her BMP showed elevated glucose, and her CBC showed a normal white count. Her CXR was normal. Patient ambulated with nurse and had subjective dizziness and palpitations, but no objective findings. She maintained good O2 saturation and was not tachycardic or tachypnic with normal gait. Case manager, [name], was consulted. We will draw a TSH, T3 and T4 level here tonight and she will [facility] at 10:30 to review medicines and be started on therapy.   Case discussed with Dr. Thurnell Garbe who is comfortable with this plan. Patient also in agreement and will be at appointment on Friday. Labs pending for appointment on Friday  48 y.o.[name] evaluation in the Emergency Department is complete. It has been determined that no acute conditions requiring further emergency intervention are present at this time. The patient/guardian have been advised of the diagnosis and plan. We have discussed signs and symptoms that warrant return to the ED, such as changes or worsening in symptoms.  Vital signs are stable at discharge.

## 2014-07-17 NOTE — Patient Instructions (Signed)
l  Take new medication as prescribed. 2. Follow-up here for a recheck with primary assigned doctor 3.  We are arranging a referral to endocrinology.

## 2014-07-17 NOTE — Progress Notes (Signed)
Patient ID: [name], female   DOB: [birth year], 49 y.o.   MRN: [id number] Complaint: Graves Disease  Subjective:  Patient presents for an ED visit for symptoms related to Graves disease. She was diagnosed some time in the past but has not been treated for several years. Her problem list is positive for migraine headaches, mild COPD, allergic rhinitis, sarcoidosis of the lungs and endometrosis.Also generalized anxiety disorder and depression   ROS:  GEN:   Denies fever, chills ,WT loss Skin:   Denies lesions or rashes   Admits:  Urticaria. HENT:   Denies  earache, epistaxis, sore throat, or neck pain.   Admits :  headaches                LUNGS:  Denies SOB with rest  CV:   Denies CP. Admits to some SOB with exertion ABD:   Denies abdominal pain, nausea,and vomiting            EXT:    Denies muscle spasms or swelling; no pain in lower ext, no weakness   Admits:  Muscle cramping. NEURO:   Denies numbness or tingling, denies sz, Hx of stroke.  Objective:  Filed Vitals:   07/17/14 1050  BP: 115/71  Pulse: 79  Temp: 98.4 F (36.9 C)  Resp: 16  Height: 5\' 10"  (1.778 m)  Weight: 148 lb (67.132 kg)  SpO2: 100%   Physical Exam:  General:  in no acute distress. HEENT:  no pallor, no icterus, moist oral mucosa, no  lymphadenopathy Heart:   Normal  s1 &s2  Regular rate and rhythm, without M,G,R Lungs:   Clear to auscultation bilaterally. Abdomen:  Soft, nontender, nondistended, positive bowel sounds. Exetremeties:  No pedal edema,pedal pulses normal. Neuro:   Alert, awake, oriented x3, nonfocal.     Medications: Prior to Admission medications   Medication Sig Start Date End Date Taking? Authorizing Provider  hydrOXYzine (VISTARIL) 100 MG capsule One cap at bedtime as needed for sleep  07/20/10  Yes Silverio Decamp, MD  triamcinolone cream (KENALOG) 0.1 % Apply 1 application topically 2 (two) times daily as needed. For hives   Yes Historical Provider, MD   metoprolol (TOPROL-XL) 50 MG 24 hr tablet Take 1 tablet (50 mg total) by mouth daily. 10/27/10 10/27/11  Renato Shin, MD    Assessment: 1. Graves disease 2.  3.   Plan: Follow-up blood work related to thyroid function Start on methimazole 5 mg per Dr. Doreene Burke Referral to endocrinologist.  Follow up:  The patient was given clear instructions to go to ER or return to medical center if symptoms don't improve, worsen or new problems develop. The patient verbalized understanding. The patient was told to call to get lab results if they haven't heard anything in the next week.   This note has been created with Surveyor, quantity. Any transcriptional errors are unintentional.   Micheline Chapman, FNP,BC 07/17/2014, 11:40 AM

## 2014-07-18 LAB — HEMOGLOBIN A1C
Hgb A1c MFr Bld: 6 % — ABNORMAL HIGH (ref <5.7)
Mean Plasma Glucose: 126 mg/dL — ABNORMAL HIGH (ref <117)

## 2014-07-18 LAB — T4, FREE: Free T4: 1.4 ng/dL (ref 0.80–1.80)

## 2014-07-18 LAB — THYROID PEROXIDASE ANTIBODY: Thyroperoxidase Ab SerPl-aCnc: >900 IU/mL — ABNORMAL HIGH (ref <9)

## 2014-07-18 LAB — THYROGLOBULIN ANTIBODY: THYROGLOBULIN AB: 1 [IU]/mL (ref <2)

## 2014-07-18 LAB — T3: T3, Total: 164.5 ng/dL (ref 80.0–204.0)

## 2014-07-20 LAB — ANA: Anti Nuclear Antibody(ANA): NEGATIVE

## 2014-07-22 LAB — THYROID STIMULATING IMMUNOGLOBULIN: TSI: 77 %{baseline} (ref <140)

## 2014-07-31 ENCOUNTER — Ambulatory Visit: Payer: Managed Care, Other (non HMO) | Attending: Family Medicine | Admitting: Family Medicine

## 2014-07-31 ENCOUNTER — Encounter: Payer: Self-pay | Admitting: Family Medicine

## 2014-07-31 VITALS — BP 100/66 | HR 80 | Temp 98.4°F | Resp 16 | Ht 69.0 in | Wt 148.0 lb

## 2014-07-31 DIAGNOSIS — Z720 Tobacco use: Secondary | ICD-10-CM | POA: Diagnosis not present

## 2014-07-31 DIAGNOSIS — Z114 Encounter for screening for human immunodeficiency virus [HIV]: Secondary | ICD-10-CM | POA: Diagnosis not present

## 2014-07-31 DIAGNOSIS — F17200 Nicotine dependence, unspecified, uncomplicated: Secondary | ICD-10-CM

## 2014-07-31 DIAGNOSIS — E0500 Thyrotoxicosis with diffuse goiter without thyrotoxic crisis or storm: Secondary | ICD-10-CM | POA: Diagnosis not present

## 2014-07-31 DIAGNOSIS — E0590 Thyrotoxicosis, unspecified without thyrotoxic crisis or storm: Secondary | ICD-10-CM | POA: Diagnosis not present

## 2014-07-31 DIAGNOSIS — R930 Abnormal findings on diagnostic imaging of skull and head, not elsewhere classified: Secondary | ICD-10-CM

## 2014-07-31 DIAGNOSIS — B353 Tinea pedis: Secondary | ICD-10-CM | POA: Diagnosis not present

## 2014-07-31 LAB — COMPLETE METABOLIC PANEL WITH GFR
ALBUMIN: 4.1 g/dL (ref 3.5–5.2)
ALT: 11 U/L (ref 0–35)
AST: 12 U/L (ref 0–37)
Alkaline Phosphatase: 63 U/L (ref 39–117)
BUN: 14 mg/dL (ref 6–23)
CALCIUM: 9.3 mg/dL (ref 8.4–10.5)
CO2: 24 mEq/L (ref 19–32)
CREATININE: 0.51 mg/dL (ref 0.50–1.10)
Chloride: 105 mEq/L (ref 96–112)
GLUCOSE: 79 mg/dL (ref 70–99)
Potassium: 4.1 mEq/L (ref 3.5–5.3)
SODIUM: 139 meq/L (ref 135–145)
TOTAL PROTEIN: 7.4 g/dL (ref 6.0–8.3)
Total Bilirubin: 0.3 mg/dL (ref 0.2–1.2)

## 2014-07-31 MED ORDER — ATENOLOL 50 MG PO TABS: 50.0000 mg | ORAL_TABLET | Freq: Every day | ORAL | Status: DC

## 2014-07-31 MED ORDER — TERBINAFINE HCL 250 MG PO TABS: 250.0000 mg | ORAL_TABLET | Freq: Every day | ORAL | Status: DC

## 2014-07-31 NOTE — Assessment & Plan Note (Addendum)
A: patient with Grave's disease. Symptomatic with hot flashes and palpitations. Previously treated with PTU by Dr. Loanne Drilling in 2012. Now on methimazole started 2 weeks ago here. Patient has been lost to f/u with Dr. Loanne Drilling.  P: Continue methimazole 5 mg TID since this was recently restarted Add atenolol Endocrinology referral in place

## 2014-07-31 NOTE — Assessment & Plan Note (Signed)
Screening HIV today  

## 2014-07-31 NOTE — Progress Notes (Signed)
F/U Graves Decease

## 2014-07-31 NOTE — Assessment & Plan Note (Signed)
A; desires to quit. Counseling give.  P:nicotine patch 14 mg x 6 weeks, 7 mg x 2 weeks, smoking cessation resources

## 2014-07-31 NOTE — Progress Notes (Signed)
   Subjective:    Patient ID: [name], female    DOB: 23-Mar-1966, 49 y.o.   MRN: [id number] CC: f/u hyperthyroidism due to Grave' disease  HPI  1. Grave's dz/hyperthyroidism: dx in 2012. Has a positive 24 hr thyroid scan. Previously under the care of Dr. Loanne Drilling. Previously on PTU. Now on methimazole x 2 weeks. Having palpitations, hot flashes and tenderness at thyroid gland. No chest pain or weight loss.   2. Foot rash: plantar surface foot rash x 3-4 months. Painful and itchy. Using a cream that is not helping.   Soc Hx: current smoker, desires to quit, 8 cigs per day   Review of Systems  HENT: Positive for sore throat.   Respiratory: Negative for chest tightness and shortness of breath.   Cardiovascular: Positive for palpitations.  Skin: Positive for rash.      Objective:   Physical Exam BP 100/66 mmHg  Pulse 80  Temp(Src) 98.4 F (36.9 C) (Oral)  Resp 16  Ht 5\' 9"  (1.753 m)  Wt 148 lb (67.132 kg)  BMI 21.85 kg/m2  SpO2 98%  LMP 04/23/2011  Wt Readings from Last 3 Encounters:  07/31/14 148 lb (67.132 kg)  07/17/14 148 lb (67.132 kg)  05/24/11 128 lb (58.06 kg)    General appearance: alert, cooperative and no distress Lungs: clear to auscultation bilaterally Heart: regular rate and rhythm, S1, S2 normal, no murmur, click, rub or gallop Extremities: no edema, hyperpigmented and erythematous papular rash on plantar surface of both feet.   Lab Results  Component Value Date   TSH 0.011* 07/14/2014   Lab Results  Component Value Date   TSH 0.011* 07/14/2014   T3TOTAL 164.5 07/17/2014          Assessment & Plan:

## 2014-07-31 NOTE — Assessment & Plan Note (Addendum)
A; tinea pedis x 4 months  P: lamisil daily x 2 weeks, CMP and CBC today

## 2014-07-31 NOTE — Patient Instructions (Signed)
Mrs. Kantor,  1. Smoking:  Smoking cessation support: smoking cessation hotline: 1-800-QUIT-NOW.  Smoking cessation classes are available through [facility]. Call [phone] or visit our website at [email or web address]/.  Nicotine patch, target brand 14 mg x 6 weeks 7 mg x 2 weeks  2. Autoimmune thyroid disease Continue methimazole 5 mg three times daily Add atenolol 50 mg once daily  You will be called with endocrinology appt   3. Foot fungus: lamisil 250 mg daily for 2 weeks   F/u in 4-6 weeks for pap smear and f/u foot fungus   Dr. Adrian Blackwater

## 2014-08-01 LAB — HIV ANTIBODY (ROUTINE TESTING W REFLEX): HIV: NONREACTIVE

## 2014-08-07 ENCOUNTER — Telehealth: Payer: Self-pay | Admitting: *Deleted

## 2014-08-07 NOTE — Telephone Encounter (Signed)
Left voice message to return call 

## 2014-08-07 NOTE — Telephone Encounter (Signed)
-----   Message from Boykin Nearing, MD sent at 08/02/2014  9:31 AM EDT ----- Normal CMP Screening HIV negative

## 2014-08-25 ENCOUNTER — Encounter: Payer: Self-pay | Admitting: *Deleted

## 2014-08-25 ENCOUNTER — Ambulatory Visit: Payer: Managed Care, Other (non HMO) | Admitting: Endocrinology

## 2014-09-18 ENCOUNTER — Ambulatory Visit (INDEPENDENT_AMBULATORY_CARE_PROVIDER_SITE_OTHER): Payer: Managed Care, Other (non HMO) | Admitting: Endocrinology

## 2014-09-18 ENCOUNTER — Encounter: Payer: Self-pay | Admitting: Endocrinology

## 2014-09-18 ENCOUNTER — Other Ambulatory Visit: Payer: Self-pay | Admitting: Family Medicine

## 2014-09-18 VITALS — BP 100/66 | HR 96 | Temp 98.1°F | Ht 69.0 in | Wt 148.5 lb

## 2014-09-18 DIAGNOSIS — E0590 Thyrotoxicosis, unspecified without thyrotoxic crisis or storm: Secondary | ICD-10-CM | POA: Diagnosis not present

## 2014-09-18 DIAGNOSIS — E0500 Thyrotoxicosis with diffuse goiter without thyrotoxic crisis or storm: Secondary | ICD-10-CM

## 2014-09-18 LAB — T3, FREE: T3, Free: 4.2 pg/mL (ref 2.3–4.2)

## 2014-09-18 LAB — T4, FREE: Free T4: 0.84 ng/dL (ref 0.60–1.60)

## 2014-09-18 LAB — TSH: TSH: 0.18 u[IU]/mL — ABNORMAL LOW (ref 0.35–4.50)

## 2014-09-18 NOTE — Progress Notes (Signed)
Pre visit review using our clinic review tool, if applicable. No additional management support is needed unless otherwise documented below in the visit note. 

## 2014-09-18 NOTE — Progress Notes (Signed)
Patient ID: [name], female   DOB: 1965/06/13, 49 y.o.   MRN: [id number]                                                                                                               Reason for Appointment:  Hyperthyroidism, new consultation  Referring physician: Funches   History of Present Illness:   The patient had typical symptoms of hyperthyroidism in 2012 with palpitations, weight loss, feeling hot and sweaty, weakness, fatigue, shakiness and difficulty sleeping She was treated with methimazole at that time and probably took this for about 6 months Subsequently since she was feeling better she stopped taking the medication on her own and did not follow-up She did apparently have an undetectable TSH in 2013 but no evaluation or treatment was done  Since about mid 2015 the patient has had recurrence of the above symptoms and also some symptoms of headaches, having difficulty swallowing and some weight loss. She was seen by her PCP in 3/16 and because of abnormal TSH she was started on methimazole 5 mg 3 times a day Her free T4 was normal and total T3 was 164  She does feel better with taking the medication but however forgets to take the third pill usually She still feels tired at times and also occasionally feel a little dizzy.  Does not complain of palpitations or heat intolerance but is taking atenolol Still feels a little difficulty swallowing and a feeling of something sticking in her throat   Wt Readings from Last 3 Encounters:  09/18/14 148 lb 8 oz (67.359 kg)  07/31/14 148 lb (67.132 kg)  07/17/14 148 lb (67.132 kg)     The patient was evaluated with thyroid function tests which showed the following:     Lab Results  Component Value Date   FREET4 1.40 07/17/2014   FREET4 0.85 01/13/2011   FREET4 0.54* 10/27/2010   TSH 0.011* 07/14/2014   TSH <0.008* 05/24/2011   TSH 0.16* 01/13/2011    Previous radioactive iodine uptake test in 2012 was 65%       Medication List       This list is accurate as of: 09/18/14 12:09 PM.  Always use your most recent med list.               atenolol 50 MG tablet  Commonly known as:  TENORMIN  Take 1 tablet (50 mg total) by mouth daily.     methimazole 5 MG tablet  Commonly known as:  TAPAZOLE  Take 1 tablet (5 mg total) by mouth 3 (three) times daily.     terbinafine 250 MG tablet  Commonly known as:  LAMISIL  Take 1 tablet (250 mg total) by mouth daily.            Past Medical History  Diagnosis Date  . Menometrorrhagia   . Granulomatous lung disease   . Migraines   . Anxiety DX 2011  . Depression DX 1990  . Grave's disease     Graves  Disease    Past Surgical History  Procedure Laterality Date  . Bilateral tubal  1993  . Abdominal hysterectomy      Family History  Problem Relation Age of Onset  . Hypertension Brother   . Hypertension Father   . Hypertension Mother   . Heart attack Mother   . Allergies Father   . Allergies Other     children    Social History:  reports that she has been smoking Cigarettes.  She has a 16 pack-year smoking history. She has never used smokeless tobacco. She reports that she drinks alcohol. She reports that she does not use illicit drugs.  Allergies: No Known Allergies  Review of Systems:  No recent weight loss.  Her maximum weight previously has been 156  Has no  history of high blood pressure.       No history of dyspnea on exertion.      No complaints of change in bowel habits.      There is no history of Diabetes.      No swelling of legs present       No skin rash or lesions except dry scaly areas on her heels, currently taking Lamisil for this from PCP   Examination:   BP 100/66 mmHg  Pulse 96  Temp(Src) 98.1 F (36.7 C) (Oral)  Ht 5\' 9"  (1.753 m)  Wt 148 lb 8 oz (67.359 kg)  BMI 21.92 kg/m2  SpO2 97%  LMP 04/23/2011   General Appearance:  looks somewhat underweight, fairly well nourished, pleasant, not anxious or  hyperkinetic.        Eyes: No excessive prominence, lid lag or stare. No swelling of the eyelids  Neck: The thyroid is enlarged about 1-1/2 times normal on the right side, smooth, non-tender.  Left side is not clearly palpable.  There is no lymphadenopathy .          Heart: normal S1 and S2, no murmurs .          Lungs: breath sounds are clear bilaterally Abdomen: no hepatosplenomegaly or other palpable abnormality  Extremities: hands are warm. No ankle edema. Neurological: Deep tendon reflexes at biceps are appearing slightly brisk No fine tremors are present.  Skin: No changes in her extremities except dryness and scaliness around her heels bilaterally   Assessment/Plan:   Hyperthyroidism, Likely to be from Graves' disease    Discussed with the patient the hyperthyroidism as being an autoimmune thyroid disease.  She has had a recurrence of her hyperthyroidism which was previously treated for about 6 months with antithyroid drugs Currently does not have a significant goiter Clinically she appears to be responding to 10 mg a day of methimazole She does have mild tachycardia today but may be related to anxiety  Explained the options for treatment including antithyroid drugs and radioactive iodine. Discussed the pros and cons for each treatment Although she may be a candidate for antithyroid drugs again she may likely have further recurrences Patient was also explained how I-131 is given and rationale for this as well as potential for long-term hypothyroidism  Discussed that I-131 treatment is safe and simple to do Patient understands the above discussion and treatment options. All questions were answered satisfactorily  At this time the patient is unable to decide on options for treatment and will discuss this with her next week Meanwhile will need to recheck her thyroid levels to assess adequacy of her antithyroid drug dosage Patient was given information brochures on hyperthyroidism  and radioactive iodine treatment  Total face-to-face visit time including review of previous records, clinical history and exam, counseling and education = 45 minutes  [name] 09/18/2014, 12:09 PM   Addendum: Thyroid levels show normal free T4 but high normal free T3. If she continues antithyroid drugs will keep her on 10 mg Tapazole daily and follow-up in 6 weeks otherwise plan to do I-131  Office Visit on 09/18/2014  Component Date Value Ref Range Status  . Free T4 09/18/2014 0.84  0.60 - 1.60 ng/dL Final  . TSH 09/18/2014 0.18* 0.35 - 4.50 uIU/mL Final  . T3, Free 09/18/2014 4.2  2.3 - 4.2 pg/mL Final

## 2014-09-19 NOTE — Progress Notes (Signed)
Quick Note:  Please let patient know that the thyroid level is just normal. If she wants to continue the medication she can keep taking 2 tablets daily. If she wants to do the radioactive iodine treatment will need to set this up  ______

## 2014-09-24 ENCOUNTER — Encounter: Payer: Self-pay | Admitting: *Deleted

## 2014-10-27 ENCOUNTER — Other Ambulatory Visit: Payer: Managed Care, Other (non HMO)

## 2014-10-30 ENCOUNTER — Ambulatory Visit: Payer: Managed Care, Other (non HMO) | Admitting: Endocrinology

## 2016-04-15 IMAGING — CR DG CHEST 2V
2 series · 2 of 2 positions shown · non-contrast
Comparison: 05/15/2010 and chest CT 11/17/2010

CLINICAL DATA: Palpitations with constricted swallowing and
shortness of breath 2 days. Smoker.

EXAM:
CHEST  2 VIEW

[chest pa]
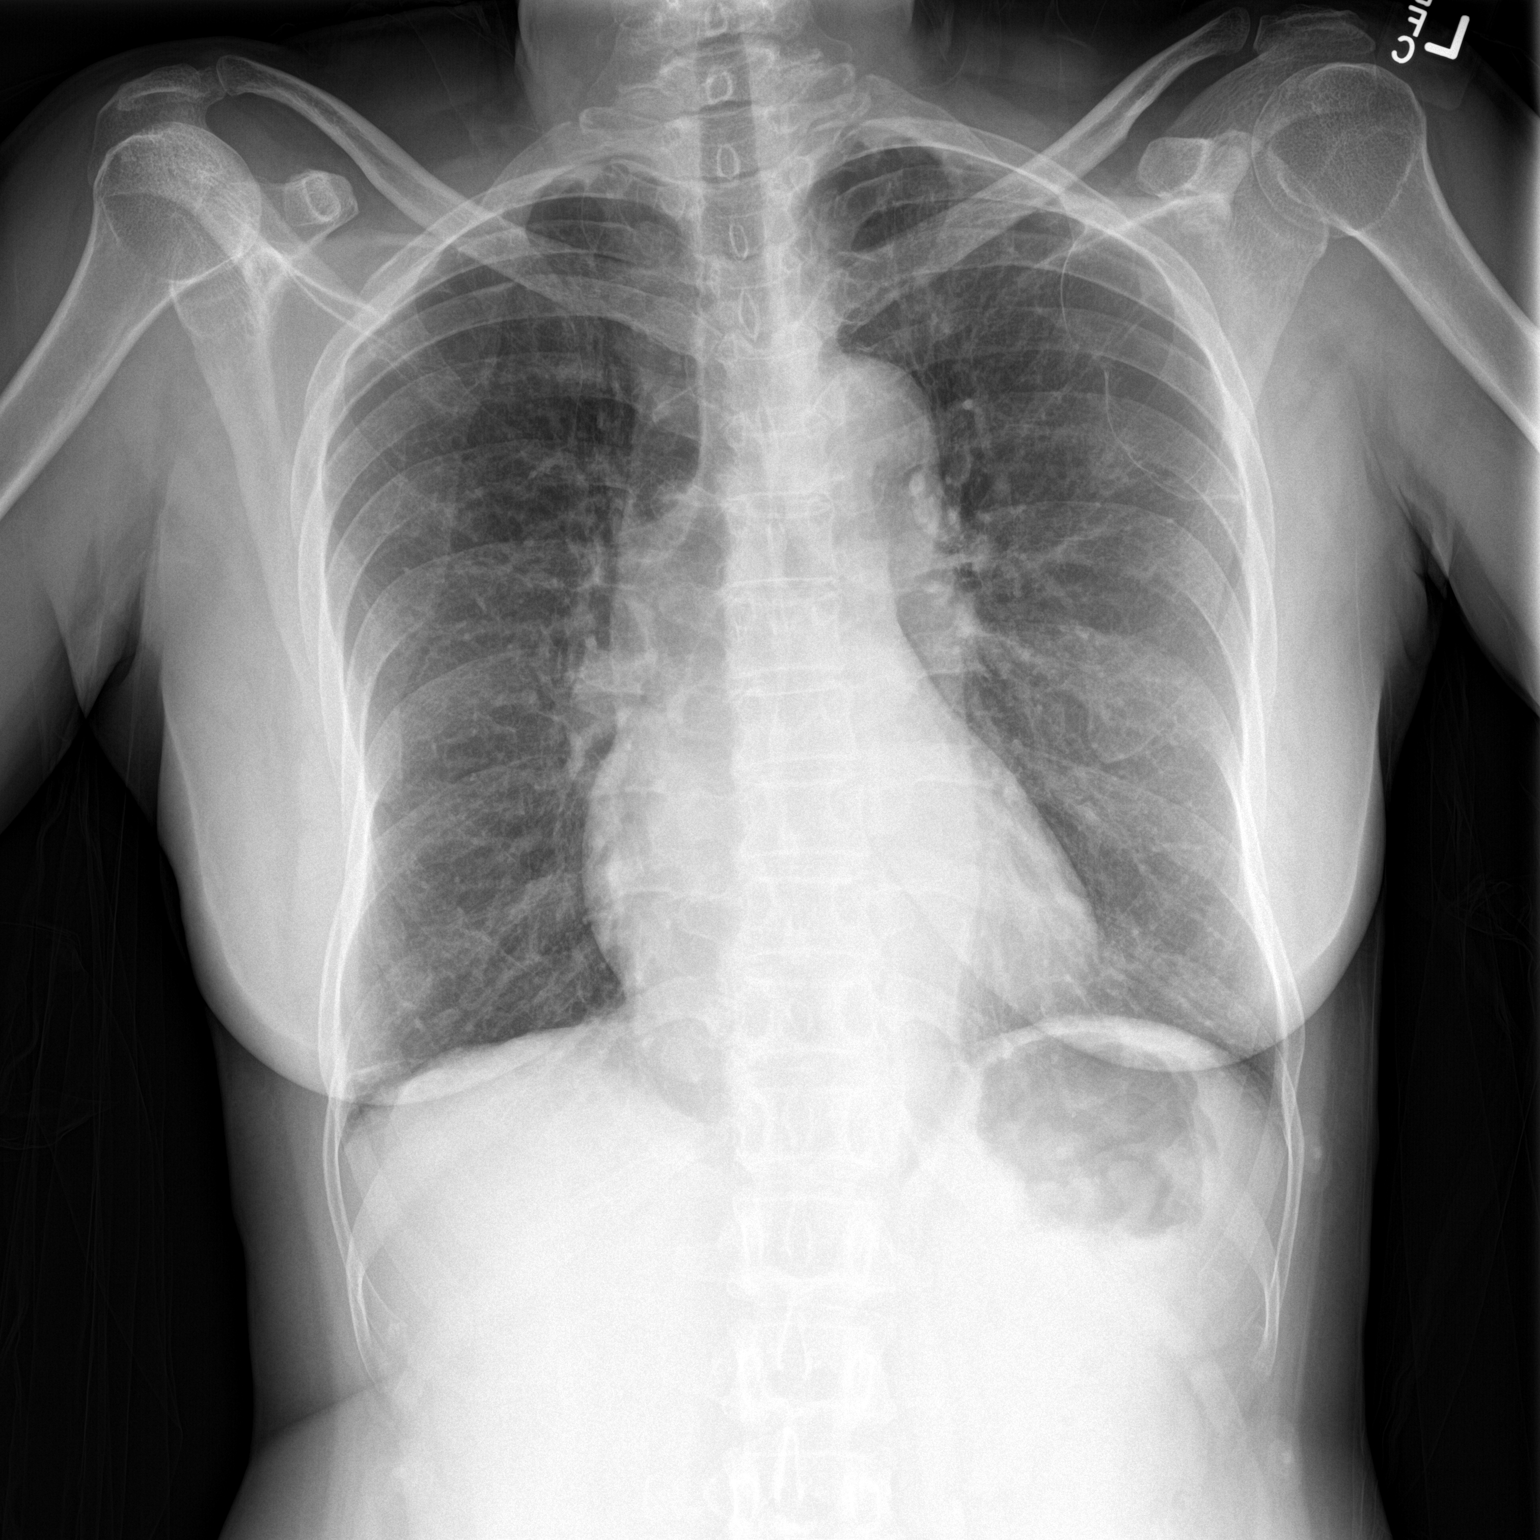

[chest lat]
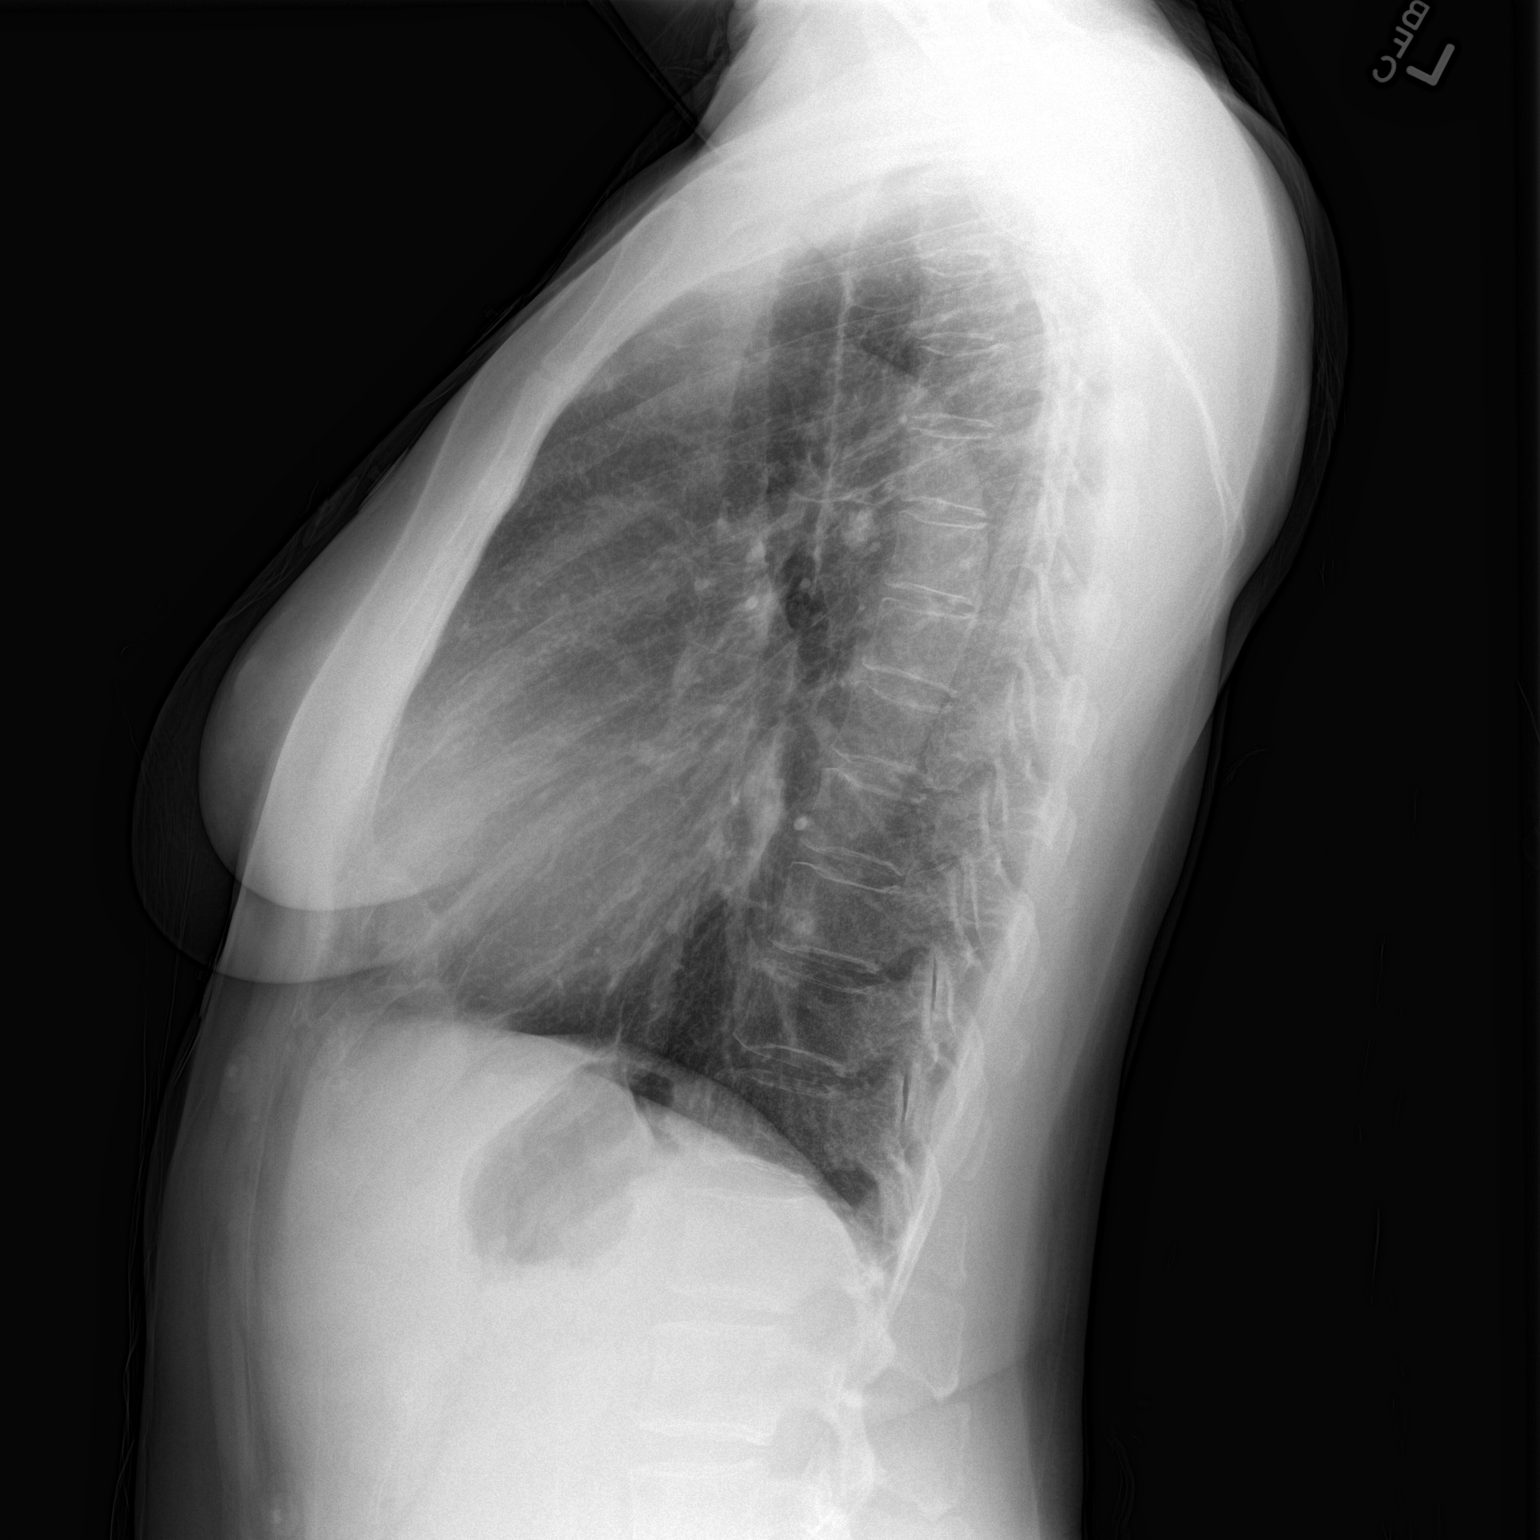

[2 of 2 positions shown; findings below may reference images not displayed]

FINDINGS: Lungs are adequately inflated without consolidation or effusion.
Stable emphysematous disease. Cardiomediastinal silhouette is within
normal. Calcified AP window lymph node. Remainder the exam is
unchanged.
IMPRESSION: No active cardiopulmonary disease.

Emphysematous disease.

## 2016-12-05 ENCOUNTER — Emergency Department (HOSPITAL_COMMUNITY)
Admission: EM | Admit: 2016-12-05 | Discharge: 2016-12-06 | Discharge status: Against Medical Advice | Payer: Managed Care, Other (non HMO)

## 2016-12-06 NOTE — ED Notes (Signed)
Called pt's name, but no response from the waiting room.  

## 2016-12-06 NOTE — ED Triage Notes (Signed)
Patient was called for a triage room with no answer.

## 2017-12-07 ENCOUNTER — Encounter: Payer: Self-pay | Admitting: Internal Medicine

## 2017-12-07 ENCOUNTER — Ambulatory Visit: Payer: 59 | Attending: Internal Medicine | Admitting: Internal Medicine

## 2017-12-07 ENCOUNTER — Other Ambulatory Visit: Payer: Self-pay

## 2017-12-07 VITALS — BP 117/81 | HR 84 | Temp 98.9°F | Resp 16 | Wt 148.4 lb

## 2017-12-07 DIAGNOSIS — F17200 Nicotine dependence, unspecified, uncomplicated: Secondary | ICD-10-CM | POA: Diagnosis not present

## 2017-12-07 DIAGNOSIS — Z79899 Other long term (current) drug therapy: Secondary | ICD-10-CM | POA: Insufficient documentation

## 2017-12-07 DIAGNOSIS — E0500 Thyrotoxicosis with diffuse goiter without thyrotoxic crisis or storm: Secondary | ICD-10-CM

## 2017-12-07 DIAGNOSIS — Z8249 Family history of ischemic heart disease and other diseases of the circulatory system: Secondary | ICD-10-CM | POA: Diagnosis not present

## 2017-12-07 DIAGNOSIS — F17210 Nicotine dependence, cigarettes, uncomplicated: Secondary | ICD-10-CM | POA: Insufficient documentation

## 2017-12-07 DIAGNOSIS — N644 Mastodynia: Secondary | ICD-10-CM | POA: Diagnosis present

## 2017-12-07 DIAGNOSIS — D860 Sarcoidosis of lung: Secondary | ICD-10-CM | POA: Diagnosis not present

## 2017-12-07 DIAGNOSIS — R079 Chest pain, unspecified: Secondary | ICD-10-CM

## 2017-12-07 DIAGNOSIS — Z90710 Acquired absence of both cervix and uterus: Secondary | ICD-10-CM | POA: Insufficient documentation

## 2017-12-07 DIAGNOSIS — J449 Chronic obstructive pulmonary disease, unspecified: Secondary | ICD-10-CM | POA: Diagnosis not present

## 2017-12-07 MED ORDER — VARENICLINE TARTRATE 1 MG PO TABS: 1.0000 mg | ORAL_TABLET | Freq: Two times a day (BID) | ORAL | 1 refills | Status: DC

## 2017-12-07 MED ORDER — VARENICLINE TARTRATE 0.5 MG X 11 & 1 MG X 42 PO MISC: ORAL | 0 refills | Status: DC

## 2017-12-07 MED ORDER — ASPIRIN EC 81 MG PO TBEC: 81.0000 mg | DELAYED_RELEASE_TABLET | Freq: Every day | ORAL | 0 refills | Status: DC

## 2017-12-07 MED FILL — CHANTIX STARTING MONTH BOX: 0.5 MG X 11 | 30 days supply | Qty: 53 | Fill #0
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
## 2017-12-07 NOTE — Patient Instructions (Signed)
Take a baby aspirin daily.

## 2017-12-07 NOTE — Progress Notes (Signed)
Pt states she noticed the problem around July 14-15  Pt states she does her self breast exams each month  Pt states when she pinches her breast she doesn't feel it  Pt states her breast does her sometimes

## 2017-12-08 ENCOUNTER — Other Ambulatory Visit: Payer: Self-pay | Admitting: Internal Medicine

## 2017-12-08 DIAGNOSIS — N644 Mastodynia: Secondary | ICD-10-CM

## 2017-12-08 LAB — CBC
HEMOGLOBIN: 11.8 g/dL (ref 11.1–15.9)
Hematocrit: 35.3 % (ref 34.0–46.6)
MCH: 26.6 pg (ref 26.6–33.0)
MCHC: 33.4 g/dL (ref 31.5–35.7)
MCV: 80 fL (ref 79–97)
Platelets: 310 10*3/uL (ref 150–450)
RBC: 4.43 x10E6/uL (ref 3.77–5.28)
RDW: 13.5 % (ref 12.3–15.4)
WBC: 5.2 10*3/uL (ref 3.4–10.8)

## 2017-12-08 LAB — COMPREHENSIVE METABOLIC PANEL
ALT: 19 IU/L (ref 0–32)
AST: 18 IU/L (ref 0–40)
Albumin/Globulin Ratio: 1.4 (ref 1.2–2.2)
Albumin: 4.1 g/dL (ref 3.5–5.5)
Alkaline Phosphatase: 105 IU/L (ref 39–117)
BUN/Creatinine Ratio: 27 — ABNORMAL HIGH (ref 9–23)
BUN: 14 mg/dL (ref 6–24)
Bilirubin Total: 0.2 mg/dL (ref 0.0–1.2)
CO2: 23 mmol/L (ref 20–29)
Calcium: 10.1 mg/dL (ref 8.7–10.2)
Chloride: 105 mmol/L (ref 96–106)
Creatinine, Ser: 0.52 mg/dL — ABNORMAL LOW (ref 0.57–1.00)
GFR calc Af Amer: 125 mL/min/{1.73_m2} (ref >59)
GFR calc non Af Amer: 109 mL/min/{1.73_m2} (ref >59)
Globulin, Total: 3 g/dL (ref 1.5–4.5)
Glucose: 81 mg/dL (ref 65–99)
POTASSIUM: 4.7 mmol/L (ref 3.5–5.2)
Sodium: 144 mmol/L (ref 134–144)
Total Protein: 7.1 g/dL (ref 6.0–8.5)

## 2017-12-08 LAB — LIPID PANEL
CHOLESTEROL TOTAL: 117 mg/dL (ref 100–199)
Chol/HDL Ratio: 2.3 ratio (ref 0.0–4.4)
HDL: 52 mg/dL (ref >39)
LDL CALC: 54 mg/dL (ref 0–99)
TRIGLYCERIDES: 56 mg/dL (ref 0–149)
VLDL Cholesterol Cal: 11 mg/dL (ref 5–40)

## 2017-12-08 LAB — TSH+T4F+T3FREE
Free T4: 2.74 ng/dL — ABNORMAL HIGH (ref 0.82–1.77)
T3 FREE: 14.3 pg/mL — AB (ref 2.0–4.4)
TSH: <0.006 u[IU]/mL — ABNORMAL LOW (ref 0.450–4.500)

## 2017-12-09 ENCOUNTER — Other Ambulatory Visit: Payer: Self-pay | Admitting: Internal Medicine

## 2017-12-09 MED ORDER — METHIMAZOLE 10 MG PO TABS: 10.0000 mg | ORAL_TABLET | Freq: Every day | ORAL | 0 refills | Status: DC

## 2017-12-10 ENCOUNTER — Telehealth: Payer: Self-pay

## 2017-12-10 DIAGNOSIS — E0500 Thyrotoxicosis with diffuse goiter without thyrotoxic crisis or storm: Secondary | ICD-10-CM

## 2017-12-10 MED FILL — methIMAzole 10 MG TABS: 10 | 30 days supply | Qty: 30 | Fill #0

## 2017-12-10 NOTE — Telephone Encounter (Signed)
Contacted pt to go over lab results pt is aware of results and doesn't have any questions or concerns  Dr. Wynetta Emery pt states she is okay with you referring her to endo

## 2017-12-10 NOTE — Addendum Note (Signed)
Addended by: Karle Plumber B on: 12/10/2017 09:07 PM   Modules accepted: Orders

## 2017-12-14 ENCOUNTER — Encounter (HOSPITAL_COMMUNITY): Payer: Self-pay | Admitting: *Deleted

## 2017-12-14 ENCOUNTER — Emergency Department (HOSPITAL_COMMUNITY): Payer: 59

## 2017-12-14 ENCOUNTER — Emergency Department (HOSPITAL_COMMUNITY)
Admission: EM | Admit: 2017-12-14 | Discharge: 2017-12-14 | Disposition: A | Discharge status: Home / Self Care | Payer: 59 | Attending: Emergency Medicine | Admitting: Emergency Medicine

## 2017-12-14 DIAGNOSIS — F17210 Nicotine dependence, cigarettes, uncomplicated: Secondary | ICD-10-CM | POA: Insufficient documentation

## 2017-12-14 DIAGNOSIS — Z7982 Long term (current) use of aspirin: Secondary | ICD-10-CM | POA: Insufficient documentation

## 2017-12-14 DIAGNOSIS — R55 Syncope and collapse: Secondary | ICD-10-CM

## 2017-12-14 DIAGNOSIS — E0500 Thyrotoxicosis with diffuse goiter without thyrotoxic crisis or storm: Secondary | ICD-10-CM | POA: Insufficient documentation

## 2017-12-14 DIAGNOSIS — J449 Chronic obstructive pulmonary disease, unspecified: Secondary | ICD-10-CM | POA: Insufficient documentation

## 2017-12-14 DIAGNOSIS — R079 Chest pain, unspecified: Secondary | ICD-10-CM | POA: Insufficient documentation

## 2017-12-14 DIAGNOSIS — Z79899 Other long term (current) drug therapy: Secondary | ICD-10-CM | POA: Diagnosis not present

## 2017-12-14 DIAGNOSIS — R0789 Other chest pain: Secondary | ICD-10-CM

## 2017-12-14 LAB — BASIC METABOLIC PANEL
Anion gap: 11 (ref 5–15)
BUN: 16 mg/dL (ref 6–20)
CHLORIDE: 110 mmol/L (ref 98–111)
CO2: 21 mmol/L — ABNORMAL LOW (ref 22–32)
CREATININE: 0.44 mg/dL (ref 0.44–1.00)
Calcium: 9.6 mg/dL (ref 8.9–10.3)
Glucose, Bld: 101 mg/dL — ABNORMAL HIGH (ref 70–99)
Potassium: 3.3 mmol/L — ABNORMAL LOW (ref 3.5–5.1)
SODIUM: 142 mmol/L (ref 135–145)

## 2017-12-14 LAB — CBC
HCT: 34.1 % — ABNORMAL LOW (ref 36.0–46.0)
Hemoglobin: 11.1 g/dL — ABNORMAL LOW (ref 12.0–15.0)
MCH: 26.6 pg (ref 26.0–34.0)
MCHC: 32.6 g/dL (ref 30.0–36.0)
MCV: 81.6 fL (ref 78.0–100.0)
PLATELETS: 309 10*3/uL (ref 150–400)
RBC: 4.18 MIL/uL (ref 3.87–5.11)
RDW: 13.2 % (ref 11.5–15.5)
WBC: 5.5 10*3/uL (ref 4.0–10.5)

## 2017-12-14 LAB — I-STAT BETA HCG BLOOD, ED (NOT ORDERABLE)

## 2017-12-14 LAB — POCT I-STAT TROPONIN I
TROPONIN I, POC: 0 ng/mL (ref 0.00–0.08)
Troponin i, poc: 0 ng/mL (ref 0.00–0.08)

## 2017-12-14 LAB — TSH: TSH: <0.01 u[IU]/mL — ABNORMAL LOW (ref 0.350–4.500)

## 2017-12-14 MED ORDER — IOPAMIDOL (ISOVUE-370) INJECTION 76%
INTRAVENOUS | Status: AC
  Filled 2017-12-14: qty 100

## 2017-12-14 MED ORDER — SODIUM CHLORIDE 0.9 % IV BOLUS
1000.0000 mL | Freq: Once | INTRAVENOUS | Status: AC
  Administered 2017-12-14: 1000 mL via INTRAVENOUS

## 2017-12-14 MED ORDER — IOPAMIDOL (ISOVUE-370) INJECTION 76%
100.0000 mL | Freq: Once | INTRAVENOUS | Status: AC | PRN
  Administered 2017-12-14: 100 mL via INTRAVENOUS

## 2017-12-14 MED ORDER — POTASSIUM CHLORIDE CRYS ER 20 MEQ PO TBCR
40.0000 meq | EXTENDED_RELEASE_TABLET | Freq: Once | ORAL | Status: AC
Start: 2017-12-14 — End: 2017-12-14
  Administered 2017-12-14: 40 meq via ORAL
  Filled 2017-12-14: qty 2

## 2017-12-14 NOTE — ED Notes (Signed)
Patient given a cup of water

## 2017-12-14 NOTE — ED Notes (Signed)
Pt refuse to be hooked back up to the monitor and was told by an EMT that coming in with chest pain that she needed to be on the monitor also to keep check on her vitals. Pt said she wanted to sit at the edge of the bed and didn't want to be on the monitor.

## 2017-12-14 NOTE — ED Notes (Signed)
Pt refusing to remain on cardiac monitor.

## 2017-12-14 NOTE — Discharge Instructions (Signed)
Return here as needed. Call your doctor about your thyroid medications.

## 2017-12-14 NOTE — ED Notes (Signed)
Pt requesting to remove cardiac monitor stickers. Explained to pt that until testing has been completed, it is best that she keep them on.

## 2017-12-14 NOTE — ED Provider Notes (Signed)
[facility] DEPT Provider Note   CSN: [id number] Arrival date & time: 12/14/17  0135     History   Chief Complaint Chief Complaint  Patient presents with  . Chest Pain    HPI [name] is a 52 y.o. female with a hx of anxiety, depression, Graves' disease, granulomatous lung disease, headaches presents to the Emergency Department complaining of intermittent sharp and stabbing left-sided chest pain onset 2 weeks ago which became constant yesterday morning.  She reports pain is significant and worse with inspiration.  She states since the onset she has felt lightheaded.  She denies room spinning dizziness.  She reports full syncopal episode at work yesterday morning.  She denies hitting her head.  Patient denies estrogen usage, long travel, leg swelling, lupus, history of DVT.  Nothing seems to make her symptoms better. Pt denies headache, neck pain, fever, chills, abdominal pain, nausea, vomiting, diarrhea, weakness.  She does report that she has been off of her thyroid medications for a long time and recently restarted them (6 days ago).  She is currently taking methimazole 10mg  QD.   The history is provided by the patient and medical records. No language interpreter was used.    Past Medical History:  Diagnosis Date  . Anxiety DX 2011  . Depression DX 1990  . Granulomatous lung disease ([place])   . Grave's disease    Graves Disease  . Menometrorrhagia   . Migraines     Patient Active Problem List   Diagnosis Date Noted  . Tinea pedis 07/31/2014  . Screening for HIV (human immunodeficiency virus) 07/31/2014  . Cramp of limb 01/13/2011  . Sarcoidosis of lung ([place]) 12/30/2010  . Rhinitis, allergic 11/29/2010  . Urticaria, idiopathic 10/27/2010  . 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 Nonspecific abnormal findings on radiological and other examination of skull and head 06/23/2010  . Graves' disease 06/03/2010  . MIGRAINE HEADACHE 11/12/2008  .  ROTATOR CUFF TEAR 11/12/2008  . DEPRESSION, RECURRENT 05/12/2008  . ANXIETY DISORDER, GENERALIZED 02/05/2007  . TOBACCO ABUSE 02/05/2007    Past Surgical History:  Procedure Laterality Date  . ABDOMINAL HYSTERECTOMY    . bilateral tubal  1993     OB History   None      Home Medications    Prior to Admission medications   Medication Sig Start Date End Date Taking? Authorizing Provider  aspirin EC 81 MG tablet Take 1 tablet (81 mg total) by mouth daily. 12/07/17   Ladell Pier, MD  methimazole (TAPAZOLE) 10 MG tablet Take 1 tablet (10 mg total) by mouth daily. 12/09/17   Ladell Pier, MD  varenicline (CHANTIX CONTINUING MONTH PAK) 1 MG tablet Take 1 tablet (1 mg total) by mouth 2 (two) times daily. 12/07/17   Ladell Pier, MD  varenicline (CHANTIX STARTING MONTH PAK) 0.5 MG X 11 & 1 MG X 42 tablet one 0.5 mg tab PO daily x 3 days, then 0.5 mg tablet twice daily x 4 days, then increase to one 1 mg tablet twice daily. 12/07/17   Ladell Pier, MD    Family History Family History  Problem Relation Age of Onset  . Hypertension Mother   . Heart attack Mother   . Hypertension Brother   . Hypertension Father   . Allergies Father   . Allergies Other        children    Social History Social History   Tobacco Use  . Smoking status: Current  Every Day Smoker    Packs/day: 0.50    Years: 32.00    Pack years: 16.00    Types: Cigarettes  . Smokeless tobacco: Never Used  Substance Use Topics  . Alcohol use: Yes    Comment: occasional beer  . Drug use: No     Allergies   Patient has no known allergies.   Review of Systems Review of Systems  Constitutional: Negative for appetite change, diaphoresis, fatigue, fever and unexpected weight change.  HENT: Negative for mouth sores.   Eyes: Negative for visual disturbance.  Respiratory: Positive for shortness of breath. Negative for cough, chest tightness and wheezing.   Cardiovascular: Positive for chest pain.   Gastrointestinal: Negative for abdominal pain, constipation, diarrhea, nausea and vomiting.  Endocrine: Negative for polydipsia, polyphagia and polyuria.  Genitourinary: Negative for dysuria, frequency, hematuria and urgency.  Musculoskeletal: Negative for back pain and neck stiffness.  Skin: Negative for rash.  Allergic/Immunologic: Negative for immunocompromised state.  Neurological: Positive for syncope and light-headedness. Negative for headaches.  Hematological: Does not bruise/bleed easily.  Psychiatric/Behavioral: Negative for sleep disturbance. The patient is not nervous/anxious.      Physical Exam Updated Vital Signs BP 118/82 (BP Location: Left Arm)   Pulse (!) 105 Comment: rn notified  Temp 98 F (36.7 C) (Oral)   Resp 16   LMP 04/23/2011   SpO2 99%   Physical Exam  Constitutional: She appears well-developed and well-nourished. No distress.  Awake, alert, Pt uncomfortable appearing  HENT:  Head: Normocephalic and atraumatic.  Mouth/Throat: Oropharynx is clear and moist. No oropharyngeal exudate.  Eyes: Conjunctivae are normal. No scleral icterus.  Neck: Normal range of motion. Neck supple.  Cardiovascular: Regular rhythm and intact distal pulses. Tachycardia present.  Pulses:      Radial pulses are 2+ on the right side, and 2+ on the left side.  Pulmonary/Chest: Effort normal and breath sounds normal. No respiratory distress. She has no wheezes.  Equal chest expansion Left breast is without erythema, induration, palpable mass, inverted nipple, skin changes or nipple discharge. Chaperone present for breast exam  Abdominal: Soft. Bowel sounds are normal. She exhibits no mass. There is no tenderness. There is no rebound and no guarding.  Musculoskeletal: Normal range of motion. She exhibits no edema.       Right lower leg: She exhibits no edema.  No calf tenderness  Neurological: She is alert.  Speech is clear and goal oriented Moves extremities without ataxia    Skin: Skin is warm and dry. She is not diaphoretic.  Psychiatric: She has a normal mood and affect.  Nursing note and vitals reviewed.    ED Treatments / Results  Labs (all labs ordered are listed, but only abnormal results are displayed) Labs Reviewed  BASIC METABOLIC PANEL - Abnormal; Notable for the following components:      Result Value   Potassium 3.3 (*)    CO2 21 (*)    Glucose, Bld 101 (*)    All other components within normal limits  CBC - Abnormal; Notable for the following components:   Hemoglobin 11.1 (*)    HCT 34.1 (*)    All other components within normal limits  TSH  I-STAT TROPONIN, ED  I-STAT BETA HCG BLOOD, ED ([facility], [facility], [facility] ONLY)  I-STAT BETA HCG BLOOD, ED (NOT ORDERABLE)  POCT I-STAT TROPONIN I  I-STAT TROPONIN, ED    EKG EKG Interpretation  Date/Time:  Friday December 14 2017 01:42:33 EDT Ventricular Rate:  109 PR  Interval:    QRS Duration: 93 QT Interval:  345 QTC Calculation: 465 R Axis:   84 Text Interpretation:  Sinus tachycardia Biatrial enlargement ST elev, probable normal early repol pattern No significant change was found Confirmed by Jola Schmidt [phone]) on 12/14/2017 4:57:10 AM   Radiology Dg Chest 2 View  Result Date: 12/14/2017 CLINICAL DATA:  Left-sided chest pain, shortness of breath, nausea, and dizziness all day yesterday. Current smoker. EXAM: CHEST - 2 VIEW COMPARISON:  07/14/2014 FINDINGS: Normal heart size and pulmonary vascularity. Thin walled bulla in the left upper lung. No change since prior study. No evidence of airspace disease or consolidation in the lungs. No pleural effusions. No pneumothorax. Mediastinal contours appear intact. IMPRESSION: Emphysematous changes in the lungs.  No active disease. Electronically Signed   By: Lucienne Capers M.D.   On: 12/14/2017 02:34    Procedures Procedures (including critical care time)  Medications Ordered in ED Medications  potassium chloride SA (K-DUR,KLOR-CON) CR tablet 40 mEq  (has no administration in time range)  sodium chloride 0.9 % bolus 1,000 mL (1,000 mLs Intravenous New Bag/Given 12/14/17 0511)  iopamidol (ISOVUE-370) 76 % injection (has no administration in time range)  iopamidol (ISOVUE-370) 76 % injection 100 mL (has no administration in time range)     Initial Impression / Assessment and Plan / ED Course  I have reviewed the triage vital signs and the nursing notes.  Pertinent labs & imaging results that were available during my care of the patient were reviewed by me and considered in my medical decision making (see chart for details).  Clinical Course as of Dec 15 655  Fri Dec 14, 2017  0431 tachycardia  Pulse Rate(!): 106 [HM]  0432 Milk hypokalemia. Potassium given  Potassium(!): 3.3 [HM]  0432 Neg  Troponin i, poc: 0.00 [HM]  0432 Anemia noted; baseline  Hemoglobin(!): 11.1 [HM]  0433 Thin walled bulla in the left upper lung.  No evidence of pneumonia, PTX or pulmonary edema    DG Chest 2 View [HM]  0657 Pt reports she is feeling better.    [HM]    Clinical Course User Index [HM] Dannon Perlow, Jarrett S[place], [place]    Patient presents with chest pain, lightheadedness and syncope today.  Patient has no cardiac history.  Negative troponin.  EKG is without acute ischemia.  Tachycardia is noted and patient is persistently tachycardic despite fluid bolus.  She is not orthostatic.  Orthostatic VS for the past 24 hrs:  BP- Lying Pulse- Lying BP- Sitting Pulse- Sitting BP- Standing at 0 minutes Pulse- Standing at 0 minutes  12/14/17 0501 118/79 98 129/87 106 116/83 117   Patient does have a history of Graves' disease and recently restarted methimazole however symptoms today are less likely to be thyroid storm.  Patient without fever, hypertension or significant tachycardia.  Patient is not altered.  TSH pending.  6:13 AM For possible pulmonary embolism with pleuritic chest pain and persistent tachycardia.  CT angiogram pending.  6:58 AM At  shift change care was transferred to [facility], PA-C who will follow pending studies, re-evaulate and determine disposition.     Final Clinical Impressions(s) / ED Diagnoses   Final diagnoses:  Left-sided chest pain  Syncope, unspecified syncope type    ED Discharge Orders    None       Loni Muse Gwenlyn Perking 12/14/17 3664    Jola Schmidt, MD 12/14/17 [phone]

## 2017-12-14 NOTE — ED Notes (Signed)
EKG given to EDP,Campos,MD., for review. 

## 2017-12-14 NOTE — ED Triage Notes (Signed)
Pt c/o left sided chest pain, with shortness of breath, nausea, dizziness, all day yesterday.Pt sts she fainted at work. Pt does reports having similar episodes in the last few weeks, on and off. She reports hx of graves disease and she thinks the symptoms  She is experiencing are due to flare up of Graves. Pt sts she was off of her thyroid meds for a long time, because she thought she didn't need them, but she just recently established new pcp and they restarted her meds (on Friday)

## 2017-12-14 NOTE — ED Provider Notes (Signed)
Patient is advised she will need to follow-up with her doctor for further evaluation of her TSH.  Patient's CTA of her chest did not show any signs of pulmonary embolism.   Dalia Heading, PA-C 12/14/17 9150    Varney Biles, MD 12/14/17 1035

## 2017-12-24 ENCOUNTER — Other Ambulatory Visit: Payer: Self-pay

## 2017-12-25 ENCOUNTER — Ambulatory Visit
Admission: RE | Admit: 2017-12-25 | Discharge: 2017-12-25 | Disposition: A | Discharge status: Home / Self Care | Payer: 59 | Source: Ambulatory Visit | Attending: Internal Medicine | Admitting: Internal Medicine

## 2017-12-25 ENCOUNTER — Other Ambulatory Visit: Payer: Self-pay | Admitting: Internal Medicine

## 2017-12-25 DIAGNOSIS — N644 Mastodynia: Secondary | ICD-10-CM

## 2017-12-25 DIAGNOSIS — N6310 Unspecified lump in the right breast, unspecified quadrant: Secondary | ICD-10-CM

## 2018-01-16 ENCOUNTER — Ambulatory Visit: Payer: Managed Care, Other (non HMO) | Admitting: Endocrinology

## 2018-01-16 DIAGNOSIS — Z0289 Encounter for other administrative examinations: Secondary | ICD-10-CM

## 2018-02-07 ENCOUNTER — Ambulatory Visit: Payer: Managed Care, Other (non HMO) | Admitting: Internal Medicine

## 2018-05-14 ENCOUNTER — Ambulatory Visit: Payer: 59 | Admitting: Internal Medicine

## 2018-07-11 ENCOUNTER — Other Ambulatory Visit: Payer: Self-pay

## 2018-07-11 ENCOUNTER — Encounter (HOSPITAL_COMMUNITY): Payer: Self-pay

## 2018-07-11 ENCOUNTER — Emergency Department (HOSPITAL_COMMUNITY)
Admission: EM | Admit: 2018-07-11 | Discharge: 2018-07-11 | Disposition: A | Discharge status: Home / Self Care | Payer: 59 | Attending: Emergency Medicine | Admitting: Emergency Medicine

## 2018-07-11 ENCOUNTER — Emergency Department (HOSPITAL_COMMUNITY): Payer: 59

## 2018-07-11 DIAGNOSIS — J449 Chronic obstructive pulmonary disease, unspecified: Secondary | ICD-10-CM | POA: Insufficient documentation

## 2018-07-11 DIAGNOSIS — R002 Palpitations: Secondary | ICD-10-CM | POA: Diagnosis present

## 2018-07-11 DIAGNOSIS — M25551 Pain in right hip: Secondary | ICD-10-CM | POA: Insufficient documentation

## 2018-07-11 DIAGNOSIS — F17210 Nicotine dependence, cigarettes, uncomplicated: Secondary | ICD-10-CM | POA: Diagnosis not present

## 2018-07-11 DIAGNOSIS — Z7982 Long term (current) use of aspirin: Secondary | ICD-10-CM | POA: Insufficient documentation

## 2018-07-11 DIAGNOSIS — E0590 Thyrotoxicosis, unspecified without thyrotoxic crisis or storm: Secondary | ICD-10-CM | POA: Diagnosis not present

## 2018-07-11 LAB — BASIC METABOLIC PANEL
Anion gap: 8 (ref 5–15)
BUN: 16 mg/dL (ref 6–20)
CO2: 24 mmol/L (ref 22–32)
Calcium: 9.6 mg/dL (ref 8.9–10.3)
Chloride: 107 mmol/L (ref 98–111)
Creatinine, Ser: 0.5 mg/dL (ref 0.44–1.00)
GFR calc Af Amer: >60 mL/min (ref >60)
GFR calc non Af Amer: >60 mL/min (ref >60)
Glucose, Bld: 133 mg/dL — ABNORMAL HIGH (ref 70–99)
Potassium: 3.7 mmol/L (ref 3.5–5.1)
Sodium: 139 mmol/L (ref 135–145)

## 2018-07-11 LAB — CBC
HCT: 37.6 % (ref 36.0–46.0)
HEMOGLOBIN: 11.2 g/dL — AB (ref 12.0–15.0)
MCH: 25.3 pg — ABNORMAL LOW (ref 26.0–34.0)
MCHC: 29.8 g/dL — AB (ref 30.0–36.0)
MCV: 84.9 fL (ref 80.0–100.0)
Platelets: 297 10*3/uL (ref 150–400)
RBC: 4.43 MIL/uL (ref 3.87–5.11)
RDW: 14.1 % (ref 11.5–15.5)
WBC: 4.7 10*3/uL (ref 4.0–10.5)
nRBC: 0 % (ref 0.0–0.2)

## 2018-07-11 LAB — I-STAT TROPONIN, ED: Troponin i, poc: 0.01 ng/mL (ref 0.00–0.08)

## 2018-07-11 LAB — TSH: TSH: <0.01 u[IU]/mL — ABNORMAL LOW (ref 0.350–4.500)

## 2018-07-11 LAB — I-STAT BETA HCG BLOOD, ED (MC, WL, AP ONLY): I-stat hCG, quantitative: <5 m[IU]/mL

## 2018-07-11 MED ORDER — ACETAMINOPHEN 500 MG PO TABS
1000.0000 mg | ORAL_TABLET | Freq: Once | ORAL | Status: AC
  Administered 2018-07-11: 1000 mg via ORAL
  Filled 2018-07-11: qty 2

## 2018-07-11 MED ORDER — OXYCODONE HCL 5 MG PO TABS
5.0000 mg | ORAL_TABLET | Freq: Once | ORAL | Status: AC
  Administered 2018-07-11: 5 mg via ORAL
  Filled 2018-07-11: qty 1

## 2018-07-11 MED ORDER — SODIUM CHLORIDE 0.9% FLUSH
3.0000 mL | Freq: Once | INTRAVENOUS | Status: AC
  Administered 2018-07-11: 3 mL via INTRAVENOUS

## 2018-07-11 MED ORDER — KETOROLAC TROMETHAMINE 30 MG/ML IJ SOLN
15.0000 mg | Freq: Once | INTRAMUSCULAR | Status: AC
  Administered 2018-07-11: 15 mg via INTRAVENOUS
  Filled 2018-07-11: qty 1

## 2018-07-11 NOTE — ED Triage Notes (Signed)
Pt reports palpitations and feeling like her heart is racing for 3-4 days. She states that she has Graves Disease and this has happened before. She is also complaining of R hip pain that started 3 months ago. Denies injury. Denies cough, fever, nausea, or diarrhea.

## 2018-07-11 NOTE — ED Provider Notes (Signed)
[facility] DEPT Provider Note   CSN: [id number] Arrival date & time: 07/11/18  1905    History   Chief Complaint Chief Complaint  Patient presents with   Palpitations   Hip Pain    R    HPI [name] is a 53 y.o. female.     53 yo F with a chief complaint of palpitations weight loss heat irritability and right hip pain.  She has been having these episodes for at least the past few months.  She has been taking her hyperthyroidism medicine but has not followed back up with her endocrinologist.  Having pain to the right groin area worse with bearing weight for long periods of time.  Has been trying BC powders with minimal improvement.  Denies trauma denies fever.  Denies confusion.  The history is provided by the patient.  Palpitations  Palpitations quality:  Regular Onset quality:  Gradual Duration:  8 weeks Timing:  Constant Progression:  Worsening Chronicity:  New Relieved by:  Nothing Worsened by:  Nothing Ineffective treatments:  None tried Associated symptoms: no chest pain, no dizziness, no nausea, no shortness of breath and no vomiting   Hip Pain  Pertinent negatives include no chest pain, no headaches and no shortness of breath.    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[place])    Grave's disease    Graves Disease   Menometrorrhagia    Migraines     Patient Active Problem List   Diagnosis Date Noted   Tinea pedis 07/31/2014   Screening for HIV (human immunodeficiency virus) 07/31/2014   Cramp of limb 01/13/2011   Sarcoidosis of lung ([place]) 12/30/2010   Rhinitis, allergic 11/29/2010   Urticaria, idiopathic 10/27/2010   COPD, mild ([place]) 08/06/2010   Nonspecific abnormal findings on radiological and other examination of skull and head 06/23/2010   Graves' disease 06/03/2010   MIGRAINE HEADACHE 11/12/2008   ROTATOR CUFF TEAR  11/12/2008   DEPRESSION, RECURRENT 05/12/2008   ANXIETY DISORDER, GENERALIZED 02/05/2007   TOBACCO ABUSE 02/05/2007    Past Surgical History:  Procedure Laterality Date   ABDOMINAL HYSTERECTOMY     bilateral tubal  1993     OB History   No obstetric history on file.      Home Medications    Prior to Admission medications   Medication Sig Start Date End Date Taking? Authorizing Provider  aspirin EC 81 MG tablet Take 1 tablet (81 mg total) by mouth daily. 12/07/17  Yes Ladell Pier, MD  Aspirin-Salicylamide-Caffeine (BC HEADACHE PO) Take 1 packet by mouth daily as needed (pain).   Yes [provider]  methimazole (TAPAZOLE) 10 MG tablet Take 1 tablet (10 mg total) by mouth daily. Patient not taking: Reported on 07/11/2018 12/09/17   Ladell Pier, MD  varenicline (CHANTIX CONTINUING MONTH PAK) 1 MG tablet Take 1 tablet (1 mg total) by mouth 2 (two) times daily. Patient not taking: Reported on 07/11/2018 12/07/17   Ladell Pier, MD  varenicline (CHANTIX STARTING MONTH PAK) 0.5 MG X 11 & 1 MG X 42 tablet one 0.5 mg tab PO daily x 3 days, then 0.5 mg tablet twice daily x 4 days, then increase to one 1 mg tablet twice daily. Patient not taking: Reported on 07/11/2018 12/07/17   Ladell Pier, MD    Family History Family History  Problem Relation Age of Onset  Hypertension Mother    Heart attack Mother    Hypertension Brother    Hypertension Father    Allergies Father    Allergies Other        children   Breast cancer Maternal Aunt    Breast cancer Maternal Aunt     Social History Social History   Tobacco Use   Smoking status: Current Every Day Smoker    Packs/day: 0.50    Years: 32.00    Pack years: 16.00    Types: Cigarettes   Smokeless tobacco: Never Used  Substance Use Topics   Alcohol use: Yes    Comment: occasional beer   Drug use: No     Allergies   Patient has no known allergies.   Review of Systems Review of  Systems  Constitutional: Negative for chills and fever.  HENT: Negative for congestion and rhinorrhea.   Eyes: Negative for redness and visual disturbance.  Respiratory: Negative for shortness of breath and wheezing.   Cardiovascular: Positive for palpitations. Negative for chest pain.  Gastrointestinal: Negative for nausea and vomiting.  Genitourinary: Negative for dysuria and urgency.  Musculoskeletal: Positive for myalgias. Negative for arthralgias.  Skin: Negative for pallor and wound.  Neurological: Negative for dizziness and headaches.     Physical Exam Updated Vital Signs BP (!) 140/98    Pulse 81    Temp 98.3 F (36.8 C) (Oral)    Resp 18    LMP 04/23/2011    SpO2 97%   Physical Exam Vitals signs and nursing note reviewed.  Constitutional:      General: She is not in acute distress.    Appearance: She is well-developed. She is not diaphoretic.  HENT:     Head: Normocephalic and atraumatic.  Eyes:     Pupils: Pupils are equal, round, and reactive to light.  Neck:     Musculoskeletal: Normal range of motion and neck supple.  Cardiovascular:     Rate and Rhythm: Normal rate and regular rhythm.     Heart sounds: No murmur. No friction rub. No gallop.   Pulmonary:     Effort: Pulmonary effort is normal.     Breath sounds: No wheezing or rales.  Abdominal:     General: There is no distension.     Palpations: Abdomen is soft.     Tenderness: There is no abdominal tenderness.  Musculoskeletal:        General: No tenderness.     Comments: Pain with flexion of the hip.  Internal rotation.  Full range of motion with some pain.  Pulse motor and sensation intact distally.  No midline spinal tenderness.  Mild left SI joint tenderness.  Skin:    General: Skin is warm and dry.  Neurological:     Mental Status: She is alert and oriented to person, place, and time.  Psychiatric:        Behavior: Behavior normal.      ED Treatments / Results  Labs (all labs ordered are  listed, but only abnormal results are displayed) Labs Reviewed  BASIC METABOLIC PANEL - Abnormal; Notable for the following components:      Result Value   Glucose, Bld 133 (*)    All other components within normal limits  CBC - Abnormal; Notable for the following components:   Hemoglobin 11.2 (*)    MCH 25.3 (*)    MCHC 29.8 (*)    All other components within normal limits  TSH  T4, FREE  I-STAT TROPONIN, ED  I-STAT BETA HCG BLOOD, ED ([facility], [facility], [facility] ONLY)    EKG EKG Interpretation  Date/Time:  Thursday July 11 2018 19:14:32 EDT Ventricular Rate:  104 PR Interval:    QRS Duration: 88 QT Interval:  332 QTC Calculation: 437 R Axis:   68 Text Interpretation:  Sinus tachycardia Biatrial enlargement Probable left ventricular hypertrophy Baseline wander in lead(s) I III aVR aVL V3 No significant change since last tracing Confirmed by Deno Etienne [phone]) on 07/11/2018 7:49:47 PM   Radiology Dg Chest 2 View  Result Date: 07/11/2018 CLINICAL DATA:  Pt reports palpitations and feeling like her heart is racing for 3-4 days. She states that she has Graves Disease and this has happened before. She is also complaining of R hip pain that started 3 months ago. Denies injury. Denies cough, fever, nausea, or diarrhea. current smoker, heart palpitations, short of breath, chest pain, no other chest complaints EXAM: CHEST - 2 VIEW COMPARISON:  Chest CTA and chest radiographs, 12/14/2017. FINDINGS: Cardiac silhouette is normal in size. No mediastinal or hilar masses. No evidence of adenopathy. Lungs are hyperexpanded. There stable bullae in the left upper lobe. No evidence of pneumonia or pulmonary edema. No pleural effusion or pneumothorax. Skeletal structures are intact. IMPRESSION: 1. No acute cardiopulmonary disease. 2. COPD/emphysema. Electronically Signed   By: Lajean Manes M.D.   On: 07/11/2018 20:09   Dg Hip Unilat W Or Wo Pelvis 2-3 Views Right  Result Date: 07/11/2018 CLINICAL DATA:  Right hip  pain with limited range of motion. Symptoms for 3 months. No injury. EXAM: DG HIP (WITH OR WITHOUT PELVIS) 2-3V RIGHT COMPARISON:  None. FINDINGS: There is no evidence of hip fracture or dislocation. There is no evidence of arthropathy or other focal bone abnormality. IMPRESSION: Negative. Electronically Signed   By: Lajean Manes M.D.   On: 07/11/2018 20:50    Procedures Procedures (including critical care time)  Medications Ordered in ED Medications  sodium chloride flush (NS) 0.9 % injection 3 mL (3 mLs Intravenous Given 07/11/18 2002)  acetaminophen (TYLENOL) tablet 1,000 mg (1,000 mg Oral Given 07/11/18 2028)  ketorolac (TORADOL) 30 MG/ML injection 15 mg (15 mg Intravenous Given 07/11/18 2028)  oxyCODONE (Oxy IR/ROXICODONE) immediate release tablet 5 mg (5 mg Oral Given 07/11/18 2028)     Initial Impression / Assessment and Plan / ED Course  I have reviewed the triage vital signs and the nursing notes.  Pertinent labs & imaging results that were available during my care of the patient were reviewed by me and considered in my medical decision making (see chart for details).        53 yo F with a chief complaint of palpitations and right hip pain.  Patient's palpitations are likely her poorly controlled hyperthyroidism.  Secondary to Graves' disease.  She has not seen her endocrinologist in some time because she was having trouble understanding what they were telling her to do.  She has seen her family doctor and they wanted her to go back to see them but she has not yet made that appointment.  States that she has been taking her medications as prescribed.  Laboratory evaluation otherwise unremarkable.  She has no signs of heart failure, she is alert and oriented is afebrile not tachycardic.  I will send off TSH and free t4 for the endocrinologist to view.  Patient also having some significant right hip pain she points actually to the joint.  I suspect she has osteoarthritis.  Plain  film  without concerning findings as viewed by me.  We will have her follow-up with orthopedics in the office.  9:11 PM:  I have discussed the diagnosis/risks/treatment options with the patient and believe the pt to be eligible for discharge home to follow-up with PCP, ortho, endo. We also discussed returning to the ED immediately if new or worsening sx occur. We discussed the sx which are most concerning (e.g., sudden worsening pain, fever, inability to tolerate by mouth, confusion) that necessitate immediate return. Medications administered to the patient during their visit and any new prescriptions provided to the patient are listed below.  Medications given during this visit Medications  sodium chloride flush (NS) 0.9 % injection 3 mL (3 mLs Intravenous Given 07/11/18 2002)  acetaminophen (TYLENOL) tablet 1,000 mg (1,000 mg Oral Given 07/11/18 2028)  ketorolac (TORADOL) 30 MG/ML injection 15 mg (15 mg Intravenous Given 07/11/18 2028)  oxyCODONE (Oxy IR/ROXICODONE) immediate release tablet 5 mg (5 mg Oral Given 07/11/18 2028)     The patient appears reasonably screen and/or stabilized for discharge and I doubt any other medical condition or other [facility] requiring further screening, evaluation, or treatment in the ED at this time prior to discharge.    Final Clinical Impressions(s) / ED Diagnoses   Final diagnoses:  Hyperthyroidism  Right hip pain    ED Discharge Orders    None       Deno Etienne, DO 07/11/18 2111

## 2018-07-11 NOTE — ED Notes (Signed)
Pt back from XR at this time.  

## 2018-07-11 NOTE — Discharge Instructions (Addendum)
Take 4 over the counter ibuprofen tablets 3 times a day or 2 over-the-counter naproxen tablets twice a day for pain. Also take tylenol 1000mg (2 extra strength) four times a day.   Your x-ray of your right hip showed no concerning findings.  Please follow-up with orthopedics, they may want further imaging however they may want to try an injection depending on what they suspect on their evaluation of you in the office.  Please return for fever or inability to walk.  Please follow-up with your endocrinologist for your hyperthyroidism.

## 2018-07-12 LAB — T4, FREE: Free T4: 2.45 ng/dL — ABNORMAL HIGH (ref 0.82–1.77)

## 2018-07-25 ENCOUNTER — Inpatient Hospital Stay: Payer: 59

## 2018-09-26 ENCOUNTER — Other Ambulatory Visit: Payer: Self-pay | Admitting: Internal Medicine

## 2018-10-08 ENCOUNTER — Telehealth: Payer: Self-pay | Admitting: Internal Medicine

## 2018-10-08 NOTE — Telephone Encounter (Signed)
Called the pt and lvm for her to call back for an appointment

## 2018-10-08 NOTE — Telephone Encounter (Signed)
-----   Message from Ladell Pier, MD sent at 09/27/2018  7:51 AM EDT ----- Regarding: needs f/u appt.  1 Gave 1 RF on thyroid medication until then

## 2018-10-17 ENCOUNTER — Ambulatory Visit: Payer: Self-pay | Attending: Internal Medicine | Admitting: Internal Medicine

## 2018-10-17 ENCOUNTER — Other Ambulatory Visit: Payer: Self-pay

## 2018-10-17 ENCOUNTER — Encounter: Payer: Self-pay | Admitting: Internal Medicine

## 2018-10-17 VITALS — BP 120/82 | HR 78 | Temp 98.5°F | Resp 16 | Wt 145.2 lb

## 2018-10-17 DIAGNOSIS — Z23 Encounter for immunization: Secondary | ICD-10-CM

## 2018-10-17 DIAGNOSIS — Z1211 Encounter for screening for malignant neoplasm of colon: Secondary | ICD-10-CM

## 2018-10-17 DIAGNOSIS — M25551 Pain in right hip: Secondary | ICD-10-CM

## 2018-10-17 DIAGNOSIS — R739 Hyperglycemia, unspecified: Secondary | ICD-10-CM

## 2018-10-17 DIAGNOSIS — D649 Anemia, unspecified: Secondary | ICD-10-CM

## 2018-10-17 DIAGNOSIS — Z1159 Encounter for screening for other viral diseases: Secondary | ICD-10-CM

## 2018-10-17 DIAGNOSIS — G8929 Other chronic pain: Secondary | ICD-10-CM

## 2018-10-17 DIAGNOSIS — E0500 Thyrotoxicosis with diffuse goiter without thyrotoxic crisis or storm: Secondary | ICD-10-CM

## 2018-10-17 DIAGNOSIS — F17200 Nicotine dependence, unspecified, uncomplicated: Secondary | ICD-10-CM

## 2018-10-17 MED ORDER — NICOTINE 14 MG/24HR TD PT24: 14.0000 mg | MEDICATED_PATCH | Freq: Every day | TRANSDERMAL | 1 refills | Status: DC

## 2018-10-17 MED ORDER — IBUPROFEN 800 MG PO TABS: 800.0000 mg | ORAL_TABLET | Freq: Three times a day (TID) | ORAL | 1 refills | Status: DC | PRN

## 2018-10-17 NOTE — Patient Instructions (Addendum)
I have sent prescription to your pharmacy for nicotine patches 14 mg.  Change daily.  I have referred you to endocrinologist, and orthopedics specialist for your right hip and to the gastroenterologist for the colonoscopy.
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
## 2018-10-17 NOTE — Progress Notes (Signed)
Pt is requesting a dose change in her graves disease medicine   Pt states she has been having hot flashes  Pt states she has been taking a lot of bc powder for her headache which are not working anymore   Pt states she has lost a lot weight

## 2018-10-18 ENCOUNTER — Other Ambulatory Visit: Payer: Self-pay | Admitting: Internal Medicine

## 2018-10-18 ENCOUNTER — Encounter: Payer: Self-pay | Admitting: Internal Medicine

## 2018-10-18 DIAGNOSIS — R7303 Prediabetes: Secondary | ICD-10-CM | POA: Insufficient documentation

## 2018-10-18 LAB — IRON,TIBC AND FERRITIN PANEL
Ferritin: 64 ng/mL (ref 15–150)
Iron Saturation: 20 % (ref 15–55)
Iron: 67 ug/dL (ref 27–159)
Total Iron Binding Capacity: 343 ug/dL (ref 250–450)
UIBC: 276 ug/dL (ref 131–425)

## 2018-10-18 LAB — TSH+T4F+T3FREE
Free T4: 1.22 ng/dL (ref 0.82–1.77)
T3, Free: 4.5 pg/mL — ABNORMAL HIGH (ref 2.0–4.4)
TSH: <0.005 u[IU]/mL — ABNORMAL LOW (ref 0.450–4.500)

## 2018-10-18 LAB — CBC
Hematocrit: 37.7 % (ref 34.0–46.6)
Hemoglobin: 12 g/dL (ref 11.1–15.9)
MCH: 25.9 pg — ABNORMAL LOW (ref 26.6–33.0)
MCHC: 31.8 g/dL (ref 31.5–35.7)
MCV: 81 fL (ref 79–97)
Platelets: 382 10*3/uL (ref 150–450)
RBC: 4.63 x10E6/uL (ref 3.77–5.28)
RDW: 14.9 % (ref 11.7–15.4)
WBC: 5 10*3/uL (ref 3.4–10.8)

## 2018-10-18 LAB — HEMOGLOBIN A1C
Est. average glucose Bld gHb Est-mCnc: 117 mg/dL
Hgb A1c MFr Bld: 5.7 % — ABNORMAL HIGH (ref 4.8–5.6)

## 2018-10-18 LAB — HEPATITIS C ANTIBODY: Hep C Virus Ab: <0.1 s/co ratio (ref 0.0–0.9)

## 2018-10-18 MED ORDER — METHIMAZOLE 10 MG PO TABS: 15.0000 mg | ORAL_TABLET | Freq: Every day | ORAL | 1 refills | Status: DC

## 2018-10-21 ENCOUNTER — Telehealth: Payer: Self-pay

## 2018-10-21 NOTE — Telephone Encounter (Signed)
Contacted pt to go over lab results pt is aware and doesn't have any questions or concerns 

## 2018-11-04 ENCOUNTER — Ambulatory Visit (HOSPITAL_COMMUNITY): Admission: RE | Admit: 2018-11-04 | Payer: Self-pay | Source: Ambulatory Visit

## 2018-12-31 ENCOUNTER — Telehealth: Payer: Self-pay | Admitting: Internal Medicine

## 2018-12-31 DIAGNOSIS — G8929 Other chronic pain: Secondary | ICD-10-CM

## 2018-12-31 DIAGNOSIS — M25551 Pain in right hip: Secondary | ICD-10-CM

## 2018-12-31 NOTE — Telephone Encounter (Signed)
New Message   1) Medication(s) Requested (by name): Ibuprofen   2) Pharmacy of Choice: [facility] on [address]  3) Special Requests:   Approved medications will be sent to the pharmacy, we will reach out if there is an issue.  Requests made after 3pm may not be addressed until the following business day!  If a patient is unsure of the name of the medication(s) please note and ask patient to call back when they are able to provide all info, do not send to responsible party until all information is available!

## 2019-01-01 MED ORDER — IBUPROFEN 800 MG PO TABS: 800.0000 mg | ORAL_TABLET | Freq: Three times a day (TID) | ORAL | 1 refills | Status: DC | PRN

## 2019-01-01 NOTE — Telephone Encounter (Signed)
1) Medication(s) Requested (by name): Ibuprofen   2) Pharmacy of Choice: [facility] on [address]  3) Special Requests:

## 2019-01-08 ENCOUNTER — Encounter: Payer: Self-pay | Admitting: Internal Medicine

## 2019-01-08 ENCOUNTER — Ambulatory Visit: Payer: 59 | Admitting: Internal Medicine

## 2019-01-08 ENCOUNTER — Other Ambulatory Visit: Payer: Self-pay

## 2019-01-08 VITALS — BP 118/88 | HR 92 | Temp 98.6°F | Ht 69.0 in | Wt 137.6 lb

## 2019-01-08 DIAGNOSIS — E0590 Thyrotoxicosis, unspecified without thyrotoxic crisis or storm: Secondary | ICD-10-CM

## 2019-01-08 DIAGNOSIS — E0500 Thyrotoxicosis with diffuse goiter without thyrotoxic crisis or storm: Secondary | ICD-10-CM | POA: Diagnosis not present

## 2019-01-08 LAB — T4, FREE: Free T4: 1.74 ng/dL — ABNORMAL HIGH (ref 0.60–1.60)

## 2019-01-08 LAB — TSH: TSH: <0.01 u[IU]/mL — ABNORMAL LOW (ref 0.35–4.50)

## 2019-01-08 MED ORDER — PROPRANOLOL HCL ER 60 MG PO CP24: 60.0000 mg | ORAL_CAPSULE | Freq: Every day | ORAL | 1 refills | Status: DC

## 2019-01-08 NOTE — Patient Instructions (Signed)
We will set you up for a thyroid scan    Thyroid Uptake and Scan works like this: We would first check a thyroid "scan" (a special, but easy and painless type of thyroid x ray).  you go to the x-ray department of the hospital to swallow a pill, which contains a miniscule amount of radiation.  You will not notice any symptoms from this.  You will go back to the x-ray department the next day, to lie down in front of a camera.  The results of this will be sent to me.     Start propranolol 60 mg  Daily to help with the shaking, palpitations and anxiety

## 2019-01-08 NOTE — Progress Notes (Signed)
Name: [name]  MRN/ DOB: [id number], 1965/09/06    Age/ Sex: 53 y.o., female    PCP: Ladell Pier, MD   Reason for Endocrinology Evaluation: Graves' Disease      Date of Initial Endocrinology Evaluation: 01/09/2019     HPI: [name] is a 53 y.o. female with a past medical history of granulomatous lung disease, headaches, graves' disease and anxiety . The patient presented for initial endocrinology clinic visit on 01/09/2019 for consultative assistance with her graves' disease.   Was diganosed with Graves' disease ~ 8 yrs ago .Over the years she has been prescribed  methimazole , but would take it on and off due to non-compliance issues , her thyroid condition has never been optimally controlled.   Today she is admits to not taking methimazole since 09/2018. She did lose 8 lbs since June, 2020, she also endorses palpitations, tremors and heat intolerance.  She has joint aches and  Pains.  Denies diarrhea    Has occasional burning and itching around the eyes  Denies local neck enlargement, but is having difficulty swallowing   No FH of thyroid disease  Sister with lupus     HISTORY:  Past Medical History:  Past Medical History:  Diagnosis Date  . Anxiety DX 2011  . Depression DX 1990  . Granulomatous lung disease ([place])   . Grave's disease    Graves Disease  . Menometrorrhagia   . Migraines    Past Surgical History:  Past Surgical History:  Procedure Laterality Date  . ABDOMINAL HYSTERECTOMY    . bilateral tubal  1993      Social History:  reports that she has been smoking cigarettes. She has a 16.00 pack-year smoking history. She has never used smokeless tobacco. She reports current alcohol use. She reports that she does not use drugs.  Family History: family history includes Allergies in her father and another family member; Breast cancer in her maternal aunt and maternal aunt; Heart attack in her mother; Hypertension in her brother,  father, and mother.   HOME MEDICATIONS: Allergies as of 01/08/2019   No Known Allergies     Medication List       Accurate as of January 08, 2019 11:59 PM. If you have any questions, ask your nurse or doctor.        aspirin EC 81 MG tablet Take 1 tablet (81 mg total) by mouth daily.   BC HEADACHE PO Take 1 packet by mouth daily as needed (pain).   ibuprofen 800 MG tablet Commonly known as: ADVIL Take 1 tablet (800 mg total) by mouth every 8 (eight) hours as needed.   methimazole 10 MG tablet Commonly known as: TAPAZOLE Take 1.5 tablets (15 mg total) by mouth daily.   nicotine 14 mg/24hr patch Commonly known as: NICODERM CQ - dosed in mg/24 hours Place 1 patch (14 mg total) onto the skin daily.   propranolol ER 60 MG 24 hr capsule Commonly known as: INDERAL LA Take 1 capsule (60 mg total) by mouth daily. Started by: Dorita Sciara, MD         REVIEW OF SYSTEMS: A comprehensive ROS was conducted with the patient and is negative except as per HPI and below:  Review of Systems  Constitutional: Positive for malaise/fatigue and weight loss.  HENT: Negative for congestion and sore throat.   Eyes: Positive for blurred vision and pain.  Respiratory: Negative for cough and shortness of breath.  Cardiovascular: Positive for palpitations. Negative for chest pain.  Gastrointestinal: Negative for diarrhea and nausea.  Genitourinary: Negative for frequency.  Musculoskeletal: Positive for joint pain and myalgias.  Neurological: Positive for tremors. Negative for tingling.  Endo/Heme/Allergies: Negative for polydipsia.  Psychiatric/Behavioral: Negative for depression. The patient is nervous/anxious.        OBJECTIVE:  VS: BP 118/88 (BP Location: Left Arm, Patient Position: Sitting, Cuff Size: Normal)   Pulse 92   Temp 98.6 F (37 C)   Ht 5\' 9"  (1.753 m)   Wt 137 lb 9.6 oz (62.4 kg)   LMP 04/23/2011   SpO2 99%   BMI 20.32 kg/m    Wt Readings from Last 3  Encounters:  01/08/19 137 lb 9.6 oz (62.4 kg)  10/17/18 145 lb 3.2 oz (65.9 kg)  12/07/17 148 lb 6.4 oz (67.3 kg)     EXAM: General: Pt appears well and is in NAD  Eyes: External eye exam normal without stare, lid lag or exophthalmos.  EOM intact.    Ears, Nose, Throat: Hearing: Grossly intact bilaterally Dental: Good dentition  Throat: Clear without mass, erythema or exudate  Neck: General: Supple without adenopathy. Thyroid: Thyroid size normal. No thyroid bruit.  Lungs: Clear with good BS bilat with no rales, rhonchi, or wheezes  Heart: Auscultation: RRR.  Abdomen: Normoactive bowel sounds, soft, nontender, without masses or organomegaly palpable  Extremities: BL LE: No pretibial edema normal ROM and strength.  Skin: Hair: Texture and amount normal with gender appropriate distribution Skin Inspection: No rashes Skin Palpation: Skin temperature, texture, and thickness normal to palpation  Neuro: Cranial nerves: II - XII grossly intact  Motor: Normal strength throughout DTRs: 2+ and symmetric in UE without delay in relaxation phase  Mental Status: Judgment, insight: Intact Orientation: Oriented to time, place, and perso Mood and affect: No depression, anxiety, or agitation     DATA REVIEWED: Results for [name], [name] (MRN [id number]) as of 01/09/2019 13:10  Ref. Range 07/11/2018 21:03 07/12/2018 12:29 10/17/2018 11:10 01/08/2019 11:26  TSH Latest Ref Range: 0.35 - 4.50 uIU/mL <0.010 (L)  <0.005 (L) <0.01 (L)  Triiodothyronine,Free,Serum Latest Ref Range: 2.0 - 4.4 pg/mL   4.5 (H)   Triiodothyronine (T3) Latest Ref Range: 76 - 181 ng/dL    283 (H)  T4,Free(Direct) Latest Ref Range: 0.60 - 1.60 ng/dL 2.45 (H)  1.22 1.74 (H)     Thyroid Uptake and Scan 06/28/2010  Findings: The 24 hour I-131 uptake was 65%.  Normal is 10-30%.  The thyroid scan demonstrates a slightly enlarged gland with diffuse uptake consistent with Graves' disease.  No hot or cold nodules are seen.   IMPRESSION: Findings consistent with Graves' disease with elevated 24 hour I- 131 uptake of 65%.  ASSESSMENT/PLAN/RECOMMENDATIONS:   1. Hyperthyroidism Secondary to Graves' Disease  - Pt had an uptake and scan in 2012 consistent with graves' disease - She is clinically hyperthyroid - long hx of non-compliance with methimazole - We discussed that Graves' Disease is a result of an autoimmune condition involving the thyroid.  - We discussed with pt the benefits of methimazole in the Tx of hyperthyroidism, as well as the possible side effects/complications of anti-thyroid drug Tx (specifically detailing the rare, but serious side effect of agranulocytosis). She was informed of need for regular thyroid function monitoring while on methimazole to ensure appropriate dosage without over-treatment. As well, we discussed the possible side effects of methimazole including the chance of rash, the small chance of liver irritation/juandice and the <=  1 in 300-400 chance of sudden onset agranulocytosis.  We discussed importance of going to ED promptly (and stopping methimazole) if shewere to develop significant fever with severe sore throat of other evidence of acute infection.     - We extensively discussed the various treatment options for hyperthyroidism and Graves disease including ablation therapy with radioactive iodine versus antithyroid drug treatment versus surgical therapy.  We recommended to the patient that we felt, at this time, that I-131 ablation therapy would be most optimal.  We discussed the various possible benefits versus side effects of the various therapies.   - I carefully explained to the patient that one of the consequences of I-131 ablation treatment would likely be permanent hypothyroidism which would require long-term replacement therapy with LT4.  - Pt agreed to proceed with RAI ablation   Medications : Propranolol 60 mg XL daily    Labs today and in 6 weeks  F/U in 3 months    Signed electronically by: Mack Guise, MD  [facility] Endocrinology  [facility] [address] [place], [place] [address] Phone: [phone] FAX: [phone]   CC: Ladell Pier, [place] [address] Phone: [phone] Fax: [phone]   Return to Endocrinology clinic as below: Future Appointments  Date Time Provider [place]  02/04/2019  9:00 AM [facility] 1 [facility] [place]  02/04/2019  1:00 PM [facility] 2 [facility] [place]  02/05/2019  9:00 AM [facility] INJ 1 [facility]   02/18/2019 11:00 AM [facility] LAB [facility] None  04/09/2019 10:50 AM , Melanie Crazier, MD [facility] None

## 2019-01-09 ENCOUNTER — Encounter: Payer: Self-pay | Admitting: Internal Medicine

## 2019-01-09 ENCOUNTER — Telehealth: Payer: Self-pay

## 2019-01-09 LAB — T3: T3, Total: 283 ng/dL — ABNORMAL HIGH (ref 76–181)

## 2019-01-09 NOTE — Telephone Encounter (Signed)
Pre cert reference # [id number] CPT code [id number] from [facility] for in network service provider

## 2019-01-31 ENCOUNTER — Telehealth: Payer: Self-pay | Admitting: Internal Medicine

## 2019-01-31 NOTE — Telephone Encounter (Signed)
Patient requests to be called at ph# [phone] asap re: Thyroid imaging/procedure patient is scheduled for on 02/04/19 & 02/05/19. Patient is not sure what procedure she is having and instructions for the procedure. Patient is confused and requests a call back asap.

## 2019-01-31 NOTE — Telephone Encounter (Signed)
Spoke to pt and helped clear up her confusion on the imaging and the RAI treatment.

## 2019-01-31 NOTE — Telephone Encounter (Signed)
Returned pt call and lft vm to return my call

## 2019-02-04 ENCOUNTER — Encounter (HOSPITAL_COMMUNITY)
Admission: RE | Admit: 2019-02-04 | Discharge: 2019-02-04 | Disposition: A | Discharge status: Home / Self Care | Payer: 59 | Source: Ambulatory Visit | Attending: Internal Medicine | Admitting: Internal Medicine

## 2019-02-04 ENCOUNTER — Other Ambulatory Visit: Payer: Self-pay

## 2019-02-04 DIAGNOSIS — E0590 Thyrotoxicosis, unspecified without thyrotoxic crisis or storm: Secondary | ICD-10-CM | POA: Diagnosis present

## 2019-02-04 MED ORDER — SODIUM IODIDE I-123 7.4 MBQ CAPS
441.0000 | ORAL_CAPSULE | Freq: Once | ORAL | Status: AC
  Administered 2019-02-04: 441 via ORAL

## 2019-02-05 ENCOUNTER — Encounter (HOSPITAL_COMMUNITY)
Admission: RE | Admit: 2019-02-05 | Discharge: 2019-02-05 | Disposition: A | Discharge status: Home / Self Care | Payer: 59 | Source: Ambulatory Visit | Attending: Internal Medicine | Admitting: Internal Medicine

## 2019-02-10 ENCOUNTER — Telehealth: Payer: Self-pay | Admitting: Internal Medicine

## 2019-02-10 NOTE — Telephone Encounter (Signed)
[name],   I am not sure who to send this to. Please feel free to FWD to the right person.   This pt I ordered a radioactive iodine ablation on her but she only had the uptake and scan. Someone needs to contact [facility] and see why she hasn;t been scheduled for the ablation yet    Thanks   Abby Nena Jordan, MD  [facility] Endocrinology  [facility] [address] [place],  [address] Phone: [phone] FAX: [phone]

## 2019-02-11 NOTE — Telephone Encounter (Signed)
Sent this to E[facility] who normally deals with these, she stated that she would get the pt scheduled.

## 2019-02-11 NOTE — Telephone Encounter (Signed)
Scheduled patient and gave her instructions for this procedure

## 2019-02-18 ENCOUNTER — Other Ambulatory Visit: Payer: Self-pay | Admitting: Endocrinology

## 2019-02-18 ENCOUNTER — Other Ambulatory Visit: Payer: Self-pay

## 2019-02-18 ENCOUNTER — Other Ambulatory Visit (INDEPENDENT_AMBULATORY_CARE_PROVIDER_SITE_OTHER): Payer: 59

## 2019-02-18 DIAGNOSIS — E0590 Thyrotoxicosis, unspecified without thyrotoxic crisis or storm: Secondary | ICD-10-CM

## 2019-02-18 LAB — T3, FREE: T3, Free: 7.3 pg/mL — ABNORMAL HIGH (ref 2.3–4.2)

## 2019-02-18 LAB — T4, FREE: Free T4: 1.78 ng/dL — ABNORMAL HIGH (ref 0.60–1.60)

## 2019-02-18 LAB — TSH: TSH: <0.01 u[IU]/mL — ABNORMAL LOW (ref 0.35–4.50)

## 2019-02-21 ENCOUNTER — Other Ambulatory Visit: Payer: Self-pay

## 2019-02-21 ENCOUNTER — Ambulatory Visit (HOSPITAL_COMMUNITY)
Admission: RE | Admit: 2019-02-21 | Discharge: 2019-02-21 | Disposition: A | Discharge status: Home / Self Care | Payer: 59 | Source: Ambulatory Visit | Attending: Internal Medicine | Admitting: Internal Medicine

## 2019-02-21 DIAGNOSIS — E0590 Thyrotoxicosis, unspecified without thyrotoxic crisis or storm: Secondary | ICD-10-CM | POA: Diagnosis present

## 2019-02-21 MED ORDER — SODIUM IODIDE I 131 CAPSULE
13.8000 | Freq: Once | INTRAVENOUS | Status: AC | PRN
  Administered 2019-02-21: 13.8 via ORAL

## 2019-03-04 ENCOUNTER — Other Ambulatory Visit: Payer: Self-pay | Admitting: Internal Medicine

## 2019-03-09 ENCOUNTER — Other Ambulatory Visit: Payer: Self-pay

## 2019-03-09 ENCOUNTER — Emergency Department (HOSPITAL_COMMUNITY): Payer: 59

## 2019-03-09 ENCOUNTER — Ambulatory Visit (HOSPITAL_COMMUNITY)
Admission: EM | Admit: 2019-03-09 | Discharge: 2019-03-09 | Disposition: A | Discharge status: Home / Self Care | Payer: 59 | Source: Home / Self Care

## 2019-03-09 ENCOUNTER — Encounter (HOSPITAL_COMMUNITY): Payer: Self-pay | Admitting: *Deleted

## 2019-03-09 ENCOUNTER — Encounter (HOSPITAL_COMMUNITY): Payer: Self-pay

## 2019-03-09 ENCOUNTER — Emergency Department (HOSPITAL_COMMUNITY)
Admission: EM | Admit: 2019-03-09 | Discharge: 2019-03-09 | Disposition: A | Discharge status: Home / Self Care | Payer: 59 | Attending: Emergency Medicine | Admitting: Emergency Medicine

## 2019-03-09 DIAGNOSIS — Z79899 Other long term (current) drug therapy: Secondary | ICD-10-CM | POA: Insufficient documentation

## 2019-03-09 DIAGNOSIS — R0602 Shortness of breath: Secondary | ICD-10-CM | POA: Insufficient documentation

## 2019-03-09 DIAGNOSIS — F17210 Nicotine dependence, cigarettes, uncomplicated: Secondary | ICD-10-CM | POA: Insufficient documentation

## 2019-03-09 DIAGNOSIS — Z7982 Long term (current) use of aspirin: Secondary | ICD-10-CM | POA: Insufficient documentation

## 2019-03-09 DIAGNOSIS — R079 Chest pain, unspecified: Secondary | ICD-10-CM

## 2019-03-09 DIAGNOSIS — M546 Pain in thoracic spine: Secondary | ICD-10-CM | POA: Diagnosis not present

## 2019-03-09 DIAGNOSIS — J449 Chronic obstructive pulmonary disease, unspecified: Secondary | ICD-10-CM | POA: Diagnosis not present

## 2019-03-09 DIAGNOSIS — R0789 Other chest pain: Secondary | ICD-10-CM | POA: Insufficient documentation

## 2019-03-09 HISTORY — DX: Personal history of irradiation: Z92.3

## 2019-03-09 LAB — BASIC METABOLIC PANEL
Anion gap: 10 (ref 5–15)
BUN: 12 mg/dL (ref 6–20)
CO2: 24 mmol/L (ref 22–32)
Calcium: 9.4 mg/dL (ref 8.9–10.3)
Chloride: 106 mmol/L (ref 98–111)
Creatinine, Ser: 0.62 mg/dL (ref 0.44–1.00)
GFR calc Af Amer: >60 mL/min (ref >60)
GFR calc non Af Amer: >60 mL/min (ref >60)
Glucose, Bld: 97 mg/dL (ref 70–99)
Potassium: 3.9 mmol/L (ref 3.5–5.1)
Sodium: 140 mmol/L (ref 135–145)

## 2019-03-09 LAB — CBC
HCT: 34.8 % — ABNORMAL LOW (ref 36.0–46.0)
Hemoglobin: 11.3 g/dL — ABNORMAL LOW (ref 12.0–15.0)
MCH: 27.1 pg (ref 26.0–34.0)
MCHC: 32.5 g/dL (ref 30.0–36.0)
MCV: 83.5 fL (ref 80.0–100.0)
Platelets: 312 10*3/uL (ref 150–400)
RBC: 4.17 MIL/uL (ref 3.87–5.11)
RDW: 13.1 % (ref 11.5–15.5)
WBC: 5.4 10*3/uL (ref 4.0–10.5)
nRBC: 0 % (ref 0.0–0.2)

## 2019-03-09 LAB — TROPONIN I (HIGH SENSITIVITY)
Troponin I (High Sensitivity): 2 ng/L (ref <18)
Troponin I (High Sensitivity): 3 ng/L (ref <18)

## 2019-03-09 MED ORDER — ASPIRIN 325 MG PO TABS
325.0000 mg | ORAL_TABLET | Freq: Every day | ORAL | Status: DC
  Administered 2019-03-09: 325 mg via ORAL
  Filled 2019-03-09: qty 1

## 2019-03-09 MED ORDER — SODIUM CHLORIDE 0.9% FLUSH: 3.0000 mL | Freq: Once | INTRAVENOUS | Status: DC

## 2019-03-09 MED ORDER — IOHEXOL 350 MG/ML SOLN
75.0000 mL | Freq: Once | INTRAVENOUS | Status: AC | PRN
  Administered 2019-03-09: 75 mL via INTRAVENOUS

## 2019-03-09 NOTE — ED Notes (Signed)
EKG shown to K. Blanding, [place] and Dr. Sabra Heck.  Recommend pt go to ED via private vehicle.  Pt does not have a ride.

## 2019-03-09 NOTE — ED Triage Notes (Signed)
Patient complains of CP with dizziness x 3 days, states that the dizziness worse with movement. Denies illness prior to CP.

## 2019-03-09 NOTE — ED Triage Notes (Signed)
Had radioactive iodine therapy 02/21/2019

## 2019-03-09 NOTE — ED Triage Notes (Signed)
C/O hx Graves Disease.  C/O constant bilat chest pain from "under my arms around to middle of chest".  States has been belching and "feels like there's a bubble I can't get up".  C/O painful movements.  C/O some SOB at times.  Denies palpitations or sensation of racing hear.  Skin W/D.

## 2019-03-09 NOTE — ED Notes (Signed)
Patient is being discharged from the [facility] [place] and sent to the Emergency Department via wheelchair by staff. Per Dr. Sabra Heck, patient is stable but in need of higher level of care due to chest pain and risk factors. Patient is aware and verbalizes understanding of plan of care.  Vitals:   03/09/19 1114  BP: 133/84  Pulse: 82  Resp: 16  Temp: 98.5 F (36.9 C)  SpO2: 100%

## 2019-03-09 NOTE — ED Notes (Signed)
Patient transported to CT 

## 2019-03-09 NOTE — Discharge Instructions (Signed)
Your labwork, EKG, chest xray and CT scan were all reassuring today. Please take Ibuprofen and Tylenol as needed for your pain. Follow up with your PCP as well as endocrinologist regarding  your ED visit today.   Return to the ED immediately for any worsening symptoms including worsening chest pain, coughing up blood, vomiting blood, worsening shortness of breath, passing out, or if you develop fevers/chills.

## 2019-03-09 NOTE — ED Notes (Signed)
Patient verbalizes understanding of discharge instructions. Opportunity for questioning and answers were provided. Armband removed by staff, pt discharged from ED ambulatory to home.  

## 2019-03-09 NOTE — ED Provider Notes (Signed)
[facility] EMERGENCY DEPARTMENT Provider Note   CSN: [id number] Arrival date & time: 03/09/19  1139     History   Chief Complaint No chief complaint on file.   HPI [name] is a 53 y.o. female with PMHx anxiety, depression, Grave's disease with recent radioactive ablation of thyroid on 10/23 who presents to the ED today with complaints of sudden onset, constant, substernal chest pain radiating around to right upper back x 2-3 days. Pt also complains of shortness of breath, lightheadedness, and nausea. She reports she went to Urgent Care today and was told to come to the ED for further evaluation. No hx DVT/PE. No exogenous hormone use. No hemoptysis. No active malignancy. Denies fever, chills, cough, diaphoresis, unilateral leg swelling, syncope, headache, or any other associated symptoms. Pt does endorse significant FHx of CAD - reports mother and aunt both passed away from heart disease in their 45's and maternal grandmother in her 67's.       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      Home Medications    Prior to Admission medications   Medication Sig Start Date End Date Taking? Authorizing Provider  aspirin EC 81 MG tablet Take 1 tablet (81 mg total) by mouth daily. 12/07/17  Yes Ladell Pier, MD  ibuprofen (ADVIL) 800 MG tablet Take 1 tablet (800 mg total) by mouth every 8 (eight) hours as needed. Patient taking differently: Take 800 mg by mouth every 8 (eight) hours as needed for mild pain.  01/01/19  Yes Ladell Pier, MD  methimazole (TAPAZOLE) 10 MG tablet Take 1.5 tablets (15 mg total) by mouth daily. Patient taking differently: Take 10 mg by mouth daily.  10/18/18  Yes Ladell Pier, MD  Aspirin-Salicylamide-Caffeine (BC HEADACHE PO) Take 1 packet by mouth daily as needed (pain).    [provider]  nicotine (NICODERM CQ - DOSED IN MG/24 HOURS) 14 mg/24hr patch Place 1 patch (14 mg total) onto the skin daily. 10/17/18   Ladell Pier, MD  propranolol ER (INDERAL LA) 60 MG 24 hr capsule TAKE 1 CAPSULE(60 MG) BY MOUTH DAILY Patient taking differently: Take 60 mg by mouth daily.  03/04/19   Shamleffer, Melanie Crazier, MD    Family History Family History  Problem Relation Age of Onset   Hypertension Mother    Heart attack Mother    Hypertension Brother    Hypertension Father    Allergies Father    Allergies Other  children   Breast cancer Maternal Aunt    Breast cancer Maternal Aunt     Social History Social History   Tobacco Use   Smoking status: Current Every Day Smoker    Packs/day: 0.50    Years: 32.00    Pack years: 16.00    Types: Cigarettes   Smokeless tobacco: Never Used  Substance Use Topics   Alcohol use: Yes    Comment: occasional beer   Drug use: No     Allergies   Patient has no known allergies.   Review of Systems Review of Systems  Constitutional: Negative for chills and fever.  HENT: Negative for congestion.   Eyes: Negative for visual  disturbance.  Respiratory: Positive for shortness of breath. Negative for cough.   Cardiovascular: Positive for chest pain.  Gastrointestinal: Positive for nausea. Negative for abdominal pain, constipation, diarrhea and vomiting.  Genitourinary: Negative for difficulty urinating.  Musculoskeletal: Positive for back pain. Negative for myalgias.  Skin: Negative for rash.  Neurological: Positive for light-headedness. Negative for syncope and headaches.     Physical Exam Updated Vital Signs BP (!) 162/91    Pulse 66    Temp 98.6 F (37 C) (Oral)    Resp 18    LMP 04/23/2011    SpO2 98%   Physical Exam Vitals signs and nursing note reviewed.  Constitutional:      Appearance: She is ill-appearing. She is not diaphoretic.     Comments: Uncomfortable appearing female  HENT:     Head: Normocephalic and atraumatic.  Eyes:     Conjunctiva/sclera: Conjunctivae normal.  Neck:     Musculoskeletal: Neck supple.  Cardiovascular:     Rate and Rhythm: Normal rate and regular rhythm.     Pulses: Normal pulses.  Pulmonary:     Effort: Pulmonary effort is normal.     Breath sounds: Normal breath sounds. No wheezing, rhonchi or rales.  Chest:     Chest wall: Tenderness (reproducible TTP to sternum) present.  Abdominal:     Palpations: Abdomen is soft.     Tenderness: There is no abdominal tenderness. There is no right CVA tenderness, guarding or rebound.  Musculoskeletal:     Right lower leg: No edema.     Left lower leg: No edema.  Skin:    General: Skin is warm and dry.  Neurological:     Mental Status: She is alert.      ED Treatments / Results  Labs (all labs ordered are listed, but only abnormal results are displayed) Labs Reviewed  CBC - Abnormal; Notable for the following components:      Result Value   Hemoglobin 11.3 (*)    HCT 34.8 (*)    All other components within normal limits  BASIC METABOLIC PANEL  I-STAT BETA HCG BLOOD, ED (MC, [facility], AP ONLY)  TROPONIN I (HIGH  SENSITIVITY)  TROPONIN I (HIGH SENSITIVITY)    EKG EKG Interpretation  Date/Time:  Sunday March 09 2019 11:44:55 EST Ventricular Rate:  78 PR Interval:  180 QRS Duration: 82 QT Interval:  378 QTC Calculation: 430 R Axis:   81 Text Interpretation: Normal sinus rhythm Biatrial enlargement Abnormal ECG Confirmed by Davonna Belling [id number]) on 03/09/2019 11:56:45 AM   Radiology Dg Chest 2 View  Result Date: 03/09/2019 CLINICAL DATA:  Chest pain, Graves disease EXAM: CHEST - 2 VIEW COMPARISON:  07/11/2018 FINDINGS: The heart size and mediastinal contours are within normal limits. Emphysema with apical blebs. The visualized skeletal structures  are unremarkable. IMPRESSION: Emphysema without acute abnormality of the lungs. Electronically Signed   By: Eddie Candle M.D.   On: 03/09/2019 13:01   Ct Angio Chest Pe W/cm &/or Wo Cm  Result Date: 03/09/2019 CLINICAL DATA:  Evaluate for PE. EXAM: CT ANGIOGRAPHY CHEST WITH CONTRAST TECHNIQUE: Multidetector CT imaging of the chest was performed using the standard protocol during bolus administration of intravenous contrast. Multiplanar CT image reconstructions and MIPs were obtained to evaluate the vascular anatomy. CONTRAST:  57mL OMNIPAQUE IOHEXOL 350 MG/ML SOLN COMPARISON:  CT a chest 12/14/2017 FINDINGS: Cardiovascular: Normal heart size. No pericardial effusion. Aorta and main pulmonary artery normal in caliber. Adequate opacification of the pulmonary arterial system. Mild motion artifact limits evaluation. No intraluminal filling defects identified to suggest acute pulmonary embolus. Mediastinum/Nodes: No enlarged axillary, mediastinal or hilar lymphadenopathy. Similar calcified lymph nodes. Normal appearance of the esophagus. Lungs/Pleura: Central airways are patent. Dependent atelectasis. Emphysematous and bullous changes. No pleural effusion or pneumothorax. Upper Abdomen: There is a 3.8 x 3.0 cm soft tissue mass left upper quadrant anterior to  the left kidney, potentially arising from the left adrenal gland (image 100; series 5). Musculoskeletal: No aggressive or acute appearing osseous lesions. Review of the MIP images confirms the above findings. IMPRESSION: 1. No evidence for acute pulmonary embolus. 2. There is an indeterminate 3.8 cm soft tissue mass within the left upper quadrant, potentially adrenal in etiology. In the nonacute setting, recommend further evaluation with either pre and post contrast-enhanced abdominal MRI or CT for definitive characterization. Electronically Signed   By: Lovey Newcomer M.D.   On: 03/09/2019 14:07    Procedures Procedures (including critical care time)  Medications Ordered in ED Medications  sodium chloride flush (NS) 0.9 % injection 3 mL (3 mLs Intravenous Not Given 03/09/19 1217)  aspirin tablet 325 mg (325 mg Oral Given 03/09/19 1309)  iohexol (OMNIPAQUE) 350 MG/ML injection 75 mL (75 mLs Intravenous Contrast Given 03/09/19 1325)     Initial Impression / Assessment and Plan / ED Course  I have reviewed the triage vital signs and the nursing notes.  Pertinent labs & imaging results that were available during my care of the patient were reviewed by me and considered in my medical decision making (see chart for details).    53 year old female who recently had a iodine ablation done of thyroid on 10/23 presenting to the ED today complaining of substernal chest pain x2 to 3 days with shortness of breath, nausea, lightheadedness.  Seen initially at urgent care was sent here for further evaluation.  She appears uncomfortable on exam although vitals stable.  She is afebrile without tachycardia or tachypnea.  She is satting 97% on room air and able to speak in full sentences.  Patient does have a significant family history of CAD.  Also given recent procedure there is risk for PE.  Will obtain CTA at this time.  Screening labs done including troponin, CBC, BMP, EKG chest x-ray.   X-ray clear.  EKG without  ischemic changes. Initial troponin of 2. Will repeat. CBC without leukocytosis and hgb stable. No electrolyte abnormalities. Discussed with attending physician Dr. Alvino Chapel in regards to checking TSH although pt had recent TSH level checked 2 weeks ago; does not recommend checking again today.   CTA negative for PE. Does shoe incidental mass above left kidney near adrenal gland; recommend further eval with either MRI abd or CT outpatient. Pt appears more comfortable on exam currently. Repeat trop of 3. Low suspicion for  ACS currently. Will discharge patient home with close PCP follow up. Advised to take NSAIDs as needed for her pain. Strict return precautions have been discussed with pt. She is in agreement with plan at this time and stable for discharge home.   This note was prepared using Dragon voice recognition software and may include unintentional dictation errors due to the inherent limitations of voice recognition software.       Final Clinical Impressions(s) / ED Diagnoses   Final diagnoses:  Nonspecific chest pain    ED Discharge Orders    None       Eustaquio Maize, PA-C 03/09/19 1606    Davonna Belling, MD 03/09/19 2051

## 2019-03-24 ENCOUNTER — Other Ambulatory Visit: Payer: Self-pay | Admitting: Endocrinology

## 2019-03-24 ENCOUNTER — Other Ambulatory Visit: Payer: 59

## 2019-03-24 ENCOUNTER — Other Ambulatory Visit: Payer: Self-pay

## 2019-04-09 ENCOUNTER — Encounter: Payer: Self-pay | Admitting: Internal Medicine

## 2019-04-09 ENCOUNTER — Ambulatory Visit (INDEPENDENT_AMBULATORY_CARE_PROVIDER_SITE_OTHER): Payer: 59 | Admitting: Internal Medicine

## 2019-04-09 ENCOUNTER — Other Ambulatory Visit: Payer: Self-pay

## 2019-04-09 VITALS — BP 118/86 | HR 84 | Temp 98.3°F | Ht 69.0 in | Wt 140.6 lb

## 2019-04-09 DIAGNOSIS — E0590 Thyrotoxicosis, unspecified without thyrotoxic crisis or storm: Secondary | ICD-10-CM

## 2019-04-09 DIAGNOSIS — E0500 Thyrotoxicosis with diffuse goiter without thyrotoxic crisis or storm: Secondary | ICD-10-CM

## 2019-04-09 DIAGNOSIS — E890 Postprocedural hypothyroidism: Secondary | ICD-10-CM

## 2019-04-09 LAB — T4, FREE: Free T4: 0.44 ng/dL — ABNORMAL LOW (ref 0.60–1.60)

## 2019-04-09 LAB — TSH: TSH: <0.01 u[IU]/mL — ABNORMAL LOW (ref 0.35–4.50)

## 2019-04-09 MED ORDER — LEVOTHYROXINE SODIUM 50 MCG PO TABS: 50.0000 ug | ORAL_TABLET | Freq: Every day | ORAL | 3 refills | Status: DC

## 2019-04-09 NOTE — Patient Instructions (Signed)
-   Please stop by the lab today, will contact you with the results

## 2019-04-09 NOTE — Progress Notes (Signed)
Name: [name]  MRN/ DOB: [id number], Sep 03, 1965    Age/ Sex: 53 y.o., female     PCP: Ladell Pier, MD   Reason for Endocrinology Evaluation: [name]     Initial Endocrinology Clinic Visit: 01/09/2019    PATIENT IDENTIFIER: [name] is a 53 y.o., female with a past medical history of granulomatous lung [name], headaches, graves' [name] and anxiety  . She has followed with [facility] since 01/09/2019  for consultative assistance with management of her graves' [name]  HISTORICAL SUMMARY: The patient was first diagnosed with Graves' [name] ~2012  .Over the years she has been prescribed  methimazole , but would take it on and off due to non-compliance issues , her thyroid condition has never been optimally controlled.   On her initial visit to our clinic she was not taking methimazole for ~ 3 months prior to her presentation.   She is S/P RAI ablation on 02/21/2019 with 13.8 mCi I-131 sodium iodide  No FH of thyroid [name]  Sister with lupus   SUBJECTIVE:   During last visit (01/09/2019): Scheduled for RAi ablation and continued B-Blockers  Today (04/09/2019):  [name] is here for a follow up on hyperthyroidism . She is S/P RAI ablation   She is more tired, worsening sweating  Hand and leg cramps  She also has insomnia   She is c/o right eye pain and puffiness , constipation      ROS:  As per HPI.   HISTORY:  Past Medical History:  Past Medical History:  Diagnosis Date  . Anxiety DX 2011  . Depression DX 1990  . 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 Grave's [name]    Graves [name]  . History of radioactive iodine thyroid ablation    02/21/2019  . Menometrorrhagia   . Migraines     Past Surgical History:  Past Surgical History:  Procedure Laterality Date  . ABDOMINAL HYSTERECTOMY    . TUBAL LIGATION       Social History:  reports that she has been smoking cigarettes. She has a 16.00 pack-year  smoking history. She has never used smokeless tobacco. She reports current alcohol use. She reports that she does not use drugs. Family History:  Family History  Problem Relation Age of Onset  . Hypertension Mother   . Heart attack Mother   . Hypertension Brother   . Hypertension Father   . Allergies Father   . Allergies Other        children  . Breast cancer Maternal Aunt   . Breast cancer Maternal Aunt       HOME MEDICATIONS: Allergies as of 04/09/2019   No Known Allergies     Medication List       Accurate as of April 09, 2019  7:17 AM. If you have any questions, ask your nurse or doctor.        aspirin EC 81 MG tablet Take 1 tablet (81 mg total) by mouth daily.   BC HEADACHE PO Take 1 packet by mouth daily as needed (pain).   ibuprofen 800 MG tablet Commonly known as: ADVIL Take 1 tablet (800 mg total) by mouth every 8 (eight) hours as needed. What changed: reasons to take this   methimazole 10 MG tablet Commonly known as: TAPAZOLE Take 1.5 tablets (15 mg total) by mouth daily. What changed: how much to take   nicotine 14 mg/24hr patch Commonly known as: NICODERM CQ - dosed in  mg/24 hours Place 1 patch (14 mg total) onto the skin daily.   propranolol ER 60 MG 24 hr capsule Commonly known as: INDERAL LA TAKE 1 CAPSULE(60 MG) BY MOUTH DAILY What changed: See the new instructions.         OBJECTIVE:   PHYSICAL EXAM: VS: LMP 04/23/2011    EXAM: General: Pt appears well and is in NAD  Eyes: External eye exam normal without stare, lid lag or exophthalmos. But has puffiness around eyes R>L.  EOM intact.   Neck: General: Supple without adenopathy. Thyroid: Thyroid size normal.  No goiter or nodules appreciated. No thyroid bruit.  Lungs: Clear with good BS bilat with no rales, rhonchi, or wheezes  Heart: Auscultation: RRR.  Abdomen: Normoactive bowel sounds, soft, nontender, without masses or organomegaly palpable  Extremities:  BL LE: No pretibial  edema normal ROM and strength.  Neuro: Cranial nerves: II - XII grossly intact  Motor: Normal strength throughout DTRs: 2+ and symmetric in UE without delay in relaxation phase  Mental Status: Judgment, insight: Intact Orientation: Oriented to time, place, and person Mood and affect: No depression, anxiety, or agitation     DATA REVIEWED:  Results for [name], [name] (MRN [id number]) as of 04/10/2019 08:23  Ref. Range 02/18/2019 11:22 03/09/2019 11:48 04/09/2019 11:17  TSH Latest Ref Range: 0.35 - 4.50 uIU/mL <0.01 (L)  <0.01 (L)  Triiodothyronine,Free,Serum Latest Ref Range: 2.3 - 4.2 pg/mL 7.3 (H)    T4,Free(Direct) Latest Ref Range: 0.60 - 1.60 ng/dL 1.78 (H)  0.44 (L)     ASSESSMENT / PLAN / RECOMMENDATIONS:   1. 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 - Pt encouraged to double dose the following day if she were to miss a dose given long half-life of levothyroxine.     Medications   Stop Propranolol  Start Levothyroxine 50 mcg daily   2. Graves' Diease:  - Pt urged to contact her optometrist for a check up      3. Postablative Hypothyroidism:  - Will start LT-4 replacement as above with low T4.     F/U in 3 months Labs in 6 weeks    Addendum: Discussed labs with pt on 04/09/19 at 1630  Signed electronically by: Mack Guise, MD  [facility]  [facility] [address] [place], [place] [address] Phone: [phone] FAX: [phone]      CC: Ladell Pier, [place] [address] Phone: [phone]  Fax: [phone]   Return to Endocrinology clinic as  below: Future Appointments  Date Time Provider [place]  04/09/2019 10:50 AM Adrienna Karis, Melanie Crazier, MD [facility] None

## 2019-04-10 ENCOUNTER — Encounter: Payer: Self-pay | Admitting: Internal Medicine

## 2019-04-10 DIAGNOSIS — E890 Postprocedural hypothyroidism: Secondary | ICD-10-CM | POA: Insufficient documentation

## 2019-04-10 DIAGNOSIS — E0500 Thyrotoxicosis with diffuse goiter without thyrotoxic crisis or storm: Secondary | ICD-10-CM | POA: Insufficient documentation

## 2019-04-29 ENCOUNTER — Ambulatory Visit: Payer: Managed Care, Other (non HMO) | Attending: Internal Medicine

## 2019-04-29 DIAGNOSIS — U071 COVID-19: Secondary | ICD-10-CM

## 2019-05-01 LAB — NOVEL CORONAVIRUS, NAA: SARS-CoV-2, NAA: NOT DETECTED

## 2019-05-22 ENCOUNTER — Other Ambulatory Visit: Payer: 59

## 2019-05-23 ENCOUNTER — Telehealth: Payer: Self-pay | Admitting: Internal Medicine

## 2019-05-23 ENCOUNTER — Other Ambulatory Visit (INDEPENDENT_AMBULATORY_CARE_PROVIDER_SITE_OTHER): Payer: Managed Care, Other (non HMO)

## 2019-05-23 ENCOUNTER — Other Ambulatory Visit: Payer: Self-pay

## 2019-05-23 DIAGNOSIS — E0500 Thyrotoxicosis with diffuse goiter without thyrotoxic crisis or storm: Secondary | ICD-10-CM

## 2019-05-23 DIAGNOSIS — E890 Postprocedural hypothyroidism: Secondary | ICD-10-CM

## 2019-05-23 LAB — T4, FREE: Free T4: 0.54 ng/dL — ABNORMAL LOW (ref 0.60–1.60)

## 2019-05-23 LAB — TSH: TSH: 7.42 u[IU]/mL — ABNORMAL HIGH (ref 0.35–4.50)

## 2019-05-23 MED ORDER — LEVOTHYROXINE SODIUM 88 MCG PO TABS: 88.0000 ug | ORAL_TABLET | Freq: Every day | ORAL | 3 refills | Status: DC

## 2019-05-23 NOTE — Telephone Encounter (Signed)
That is what I informed pt during 1st call and she stated that was too long to wait, please advise

## 2019-05-23 NOTE — Telephone Encounter (Signed)
Pt state that she has been taking what she was prescribed the 50 mcg, informed her of increase and she wanted to know when she would have repeat labs?

## 2019-05-23 NOTE — Telephone Encounter (Signed)
Can you please confirm she has been taking the levothyroxine 50 mcg daily ? Her thyroid is still underactive.     I am going to increase it to 88 mcg daily (assuming she has been taking it)     Thanks   Abby Nena Jordan, MD  [facility] End[facility]  [facility] [address] [place], [place] [address] Phone: [phone] FAX: [phone]

## 2019-05-23 NOTE — Telephone Encounter (Signed)
Explained this to pt and she stated that she would wait until her f/u in April.

## 2019-08-06 ENCOUNTER — Other Ambulatory Visit: Payer: Self-pay

## 2019-08-08 ENCOUNTER — Encounter: Payer: Self-pay | Admitting: Internal Medicine

## 2019-08-08 ENCOUNTER — Other Ambulatory Visit: Payer: Self-pay

## 2019-08-08 ENCOUNTER — Ambulatory Visit: Payer: 59 | Admitting: Internal Medicine

## 2019-08-08 VITALS — BP 118/64 | HR 88 | Temp 98.4°F | Ht 69.0 in | Wt 150.0 lb

## 2019-08-08 DIAGNOSIS — E890 Postprocedural hypothyroidism: Secondary | ICD-10-CM | POA: Diagnosis not present

## 2019-08-08 LAB — T4, FREE: Free T4: 0.93 ng/dL (ref 0.60–1.60)

## 2019-08-08 LAB — TSH: TSH: 0.16 u[IU]/mL — ABNORMAL LOW (ref 0.35–4.50)

## 2019-08-08 MED ORDER — LEVOTHYROXINE SODIUM 88 MCG PO TABS: 88.0000 ug | ORAL_TABLET | Freq: Every day | ORAL | 2 refills | Status: DC

## 2019-08-08 NOTE — Patient Instructions (Signed)

## 2019-08-08 NOTE — Progress Notes (Signed)
Name: [name]  MRN/ DOB: [id number], [birth year]    Age/ Sex: 54 y.o., female     PCP: Ladell Pier, MD   Reason for Endocrinology Evaluation: [name]     Initial Endocrinology Clinic Visit: 01/09/2019    PATIENT IDENTIFIER: [name] is a 54 y.o., female with a past medical history of granulomatous lung disease, headaches, graves' disease and anxiety  . She has followed with [facility] since 01/09/2019  for consultative assistance with management of her graves' disease  HISTORICAL SUMMARY: The patient was first diagnosed with Graves' disease ~2012  .Over the years she has been prescribed  methimazole , but would take it on and off due to non-compliance issues , her thyroid condition has never been optimally controlled.   On her initial visit to our clinic she was not taking methimazole for ~ 3 months prior to her presentation.   She is S/P RAI ablation on 02/21/2019 with 13.8 mCi I-131 sodium iodide LT- 4 replacement started 04/2019 due to low FT4.   No FH of thyroid disease  Sister with lupus   SUBJECTIVE:   During last visit (04/09/2019): Started LT- 4 replacement.   Today (08/08/2019):  [name] is here for a follow up on hyperthyroidism . She is S/P RAI ablation in 01/2019  Pt has gained 10 lbs since her last visit here.   She continues to have fatigue and constipation.  Has been stressed due to 79 yr old son with nephrotic syndrome  Has allergies of the eyes      ROS:  As per HPI.   HISTORY:  Past Medical History:  Past Medical History:  Diagnosis Date  . Anxiety DX 2011  . Depression DX 1990  . 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 Grave's disease    Graves Disease  . History of radioactive iodine thyroid ablation    02/21/2019  . Menometrorrhagia   . Migraines    Past Surgical History:  Past Surgical History:  Procedure Laterality Date  . ABDOMINAL HYSTERECTOMY    . TUBAL LIGATION      Social  History:  reports that she has been smoking cigarettes. She has a 16.00 pack-year smoking history. She has never used smokeless tobacco. She reports current alcohol use. She reports that she does not use drugs. Family History:  Family History  Problem Relation Age of Onset  . Hypertension Mother   . Heart attack Mother   . Hypertension Brother   . Hypertension Father   . Allergies Father   . Allergies Other        children  . Breast cancer Maternal Aunt   . Breast cancer Maternal Aunt      HOME MEDICATIONS: Allergies as of 08/08/2019   No Known Allergies     Medication List       Accurate as of August 08, 2019  8:23 AM. If you have any questions, ask your nurse or doctor.        aspirin EC 81 MG tablet Take 1 tablet (81 mg total) by mouth daily.   BC HEADACHE PO Take 1 packet by mouth daily as needed (pain).   ibuprofen 800 MG tablet Commonly known as: ADVIL Take 1 tablet (800 mg total) by mouth every 8 (eight) hours as needed. What changed: reasons to take this   levothyroxine 88 MCG tablet Commonly known as: SYNTHROID Take 1 tablet (88 mcg total) by mouth daily.   nicotine 14  mg/24hr patch Commonly known as: NICODERM CQ - dosed in mg/24 hours Place 1 patch (14 mg total) onto the skin daily.         OBJECTIVE:   PHYSICAL EXAM: VS: BP 118/64 (BP Location: Left Arm, Patient Position: Sitting, Cuff Size: Normal)   Pulse 88   Temp 98.4 F (36.9 C)   Ht 5\' 9"  (1.753 m)   Wt 150 lb (68 kg)   LMP 04/23/2011   SpO2 98%   BMI 22.15 kg/m    EXAM: General: Pt appears well and is in NAD  Neck: General: Supple without adenopathy. Thyroid: Thyroid size normal.  No goiter or nodules appreciated. No thyroid bruit.  Lungs: Clear with good BS bilat with no rales, rhonchi, or wheezes  Heart: Auscultation: RRR.  Abdomen: Normoactive bowel sounds, soft, nontender, without masses or organomegaly palpable  Extremities:  BL LE: No pretibial edema normal ROM and  strength.  Mental Status: Judgment, insight: Intact Orientation: Oriented to time, place, and person Mood and affect: No depression, anxiety, or agitation     DATA REVIEWED: Results for [name], [name] (MRN [id number]) as of 08/08/2019 14:52  Ref. Range 04/09/2019 11:17 05/23/2019 09:55 08/08/2019 08:54  TSH Latest Ref Range: 0.35 - 4.50 uIU/mL <0.01 (L) 7.42 (H) 0.16 (L)  T4,Free(Direct) Latest Ref Range: 0.60 - 1.60 ng/dL 0.44 (L) 0.54 (L) 0.93      ASSESSMENT / PLAN / RECOMMENDATIONS:   1. Postablative Hypothyroidism  - She is S/P RAI Ablation on 02/21/2019 with 13.8 mCi I-131 sodium iodide - She is clinically euthyroid  - TSH low again with normal FT4, will adjust LT-4 replacement as below    Medications    Levothyroxine 88 mcg , Half a tablet on Sundays, 1 tablet the rest of the week     F/U in 4 months Labs in 8 weeks      Signed electronically by: Mack Guise, MD  [facility] Endocrin[facility]  [facility] [address], [place] [address] Phone: [phone] FAX: [phone]      CC: Ladell Pier, [place] [address] Phone: [phone]  Fax: [phone]   Return to Endocrinology clinic as below: Future Appointments  Date Time Provider [place]  08/08/2019  8:30 AM Amyriah Buras, Melanie Crazier, MD [facility] None

## 2019-12-03 ENCOUNTER — Ambulatory Visit: Payer: Self-pay

## 2019-12-03 ENCOUNTER — Other Ambulatory Visit (INDEPENDENT_AMBULATORY_CARE_PROVIDER_SITE_OTHER): Payer: 59

## 2019-12-03 ENCOUNTER — Other Ambulatory Visit: Payer: Self-pay

## 2019-12-03 ENCOUNTER — Ambulatory Visit: Payer: 59 | Admitting: Family Medicine

## 2019-12-03 ENCOUNTER — Other Ambulatory Visit: Payer: Self-pay | Admitting: Family Medicine

## 2019-12-03 ENCOUNTER — Ambulatory Visit (INDEPENDENT_AMBULATORY_CARE_PROVIDER_SITE_OTHER): Payer: 59

## 2019-12-03 VITALS — BP 131/95 | HR 80 | Temp 98.7°F | Resp 18 | Ht 67.5 in | Wt 153.0 lb

## 2019-12-03 DIAGNOSIS — M545 Low back pain, unspecified: Secondary | ICD-10-CM

## 2019-12-03 DIAGNOSIS — M25551 Pain in right hip: Secondary | ICD-10-CM

## 2019-12-03 DIAGNOSIS — G8929 Other chronic pain: Secondary | ICD-10-CM

## 2019-12-03 DIAGNOSIS — M25552 Pain in left hip: Secondary | ICD-10-CM

## 2019-12-03 MED ORDER — MELOXICAM 7.5 MG PO TABS
7.5000 mg | ORAL_TABLET | Freq: Every day | ORAL | 3 refills | Status: DC | PRN
Start: 2019-12-03 — End: 2021-04-04

## 2019-12-03 MED ORDER — PREDNISONE 20 MG PO TABS
ORAL_TABLET | ORAL | 0 refills | Status: AC
Start: 2019-12-04 — End: 2019-12-12

## 2019-12-03 MED ORDER — METHYLPREDNISOLONE ACETATE 40 MG/ML IJ SUSP
40.0000 mg | Freq: Once | INTRAMUSCULAR | Status: AC
  Administered 2019-12-03: 40 mg via INTRAMUSCULAR

## 2019-12-03 MED ORDER — KETOROLAC TROMETHAMINE 30 MG/ML IJ SOLN
30.0000 mg | Freq: Once | INTRAMUSCULAR | Status: AC
  Administered 2019-12-03: 30 mg via INTRAMUSCULAR

## 2019-12-03 MED ORDER — GABAPENTIN 100 MG PO CAPS
300.0000 mg | ORAL_CAPSULE | Freq: Every day | ORAL | 0 refills | Status: DC
Start: 2019-12-03 — End: 2021-11-04

## 2019-12-03 NOTE — Patient Instructions (Signed)
You were treated with a steroid shot and given Toradol for pain during your level clinic visit today.  You will start oral steroids tomorrow you will take as directed and complete all medication as this will assist in relieving the inflammation that is causing your hip and back pain.  Be sure to drink plenty of water and limit foods high in sugar while taking medication as prednisone can increase blood sugar readings in patients that are not diabetics.  You will take prednisone as prescribed for a total of 9  days.   Discontinue taking BC powder for pain.  I would like for you to take extra strength Tylenol 500 mg every 4-6 hours as needed for pain.  I have prescribed meloxicam 7.5 mg which is an anti-inflammatory which she will start after completing the steroids however you will only need to take as needed for hip pain and back pain.  Meloxicam is a once per day pain medication however you can take in conjunction with extra strength Tylenol to achieve maximal pain relief.  For nighttime pain I am starting you on gabapentin 300 mg you will take this at least 1 hour to 30 minutes before bedtime.  Avoid driving while taking medication as this medication can induce drowsiness.  I have also referred you over to orthopedics through [facility] health, someone from their office will contact you to schedule an appointment.   Today he will go to Primary Care at [facility] to obtain x-rays of your hips and your low back.  Either myself or member from our mobile clinic team will call you with results of your x-rays.  Also schedule a follow-up with your primary care provider for your annual physical.    Musculoskeletal Pain Musculoskeletal pain refers to aches and pains in your bones, joints, muscles, and the tissues that surround them. This pain can occur in any part of the body. It can last for a short time (acute) or a long time (chronic). A physical exam, lab tests, and imaging studies may be done to find the  cause of your musculoskeletal pain. Follow these instructions at home:  Lifestyle  Try to control or lower your stress levels. Stress increases muscle tension and can worsen musculoskeletal pain. It is important to recognize when you are anxious or stressed and learn ways to manage it. This may include: ? Meditation or yoga. ? Cognitive or behavioral therapy. ? Acupuncture or massage therapy.  You may continue all activities unless the activities cause more pain. When the pain gets better, slowly resume your normal activities. Gradually increase the intensity and duration of your activities or exercise. Managing pain, stiffness, and swelling  Take over-the-counter and prescription medicines only as told by your health care provider.  When your pain is severe, bed rest may be helpful. Lie or sit in any position that is comfortable, but get out of bed and walk around at least every couple of hours.  If directed, apply heat to the affected area as often as told by your health care provider. Use the heat source that your health care provider recommends, such as a moist heat pack or a heating pad. ? Place a towel between your skin and the heat source. ? Leave the heat on for 20-30 minutes. ? Remove the heat if your skin turns bright red. This is especially important if you are unable to feel pain, heat, or cold. You may have a greater risk of getting burned.  If directed, put ice on  the painful area. ? Put ice in a plastic bag. ? Place a towel between your skin and the bag. ? Leave the ice on for 20 minutes, 2-3 times a day. General instructions  Your health care provider may recommend that you see a physical therapist. This person can help you come up with a safe exercise program. Do any exercises as told by your physical therapist.  Keep all follow-up visits, including any physical therapy visits, as told by your health care providers. This is important. Contact a health care provider  if:  Your pain gets worse.  Medicines do not help ease your pain.  You cannot use the part of your body that hurts, such as your arm, leg, or neck.  You have trouble sleeping.  You have trouble doing your normal activities. Get help right away if:  You have a new injury and your pain is worse or different.  You feel numb or you have tingling in the painful area. Summary  Musculoskeletal pain refers to aches and pains in your bones, joints, muscles, and the tissues that surround them.  This pain can occur in any part of the body.  Your health care provider may recommend that you see a physical therapist. This person can help you come up with a safe exercise program. Do any exercises as told by your physical therapist.  Lower your stress level. Stress can worsen musculoskeletal pain. Ways to lower stress may include meditation, yoga, cognitive or behavioral therapy, acupuncture, and massage therapy. This information is not intended to replace advice given to you by your health care provider. Make sure you discuss any questions you have with your health care provider. Document Revised: 03/30/2017 Document Reviewed: 05/17/2016 Elsevier Patient Education  2020 [employer].
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
## 2019-12-03 NOTE — Progress Notes (Unsigned)
No charge visit

## 2019-12-03 NOTE — Progress Notes (Signed)
Patient has not eaten today.  Patient has taken [facility] for pain. Pain is scaled at a 12 bilaterally in her hips. Pain states pain began a year ago and has progressed to her knees.

## 2019-12-03 NOTE — Progress Notes (Signed)
Patient seen on [facility]. Orders placed for x-ray only.  Patient scheduled for lab visit only today to have x-ray completed.

## 2019-12-11 ENCOUNTER — Ambulatory Visit: Payer: 59 | Admitting: Family

## 2019-12-17 ENCOUNTER — Other Ambulatory Visit: Payer: Self-pay

## 2019-12-17 ENCOUNTER — Ambulatory Visit: Payer: Self-pay

## 2019-12-17 ENCOUNTER — Ambulatory Visit (INDEPENDENT_AMBULATORY_CARE_PROVIDER_SITE_OTHER): Payer: 59 | Admitting: Family Medicine

## 2019-12-17 ENCOUNTER — Encounter: Payer: Self-pay | Admitting: Family Medicine

## 2019-12-17 DIAGNOSIS — G8929 Other chronic pain: Secondary | ICD-10-CM | POA: Diagnosis not present

## 2019-12-17 DIAGNOSIS — M25552 Pain in left hip: Secondary | ICD-10-CM

## 2019-12-17 DIAGNOSIS — M25551 Pain in right hip: Secondary | ICD-10-CM

## 2019-12-17 DIAGNOSIS — M545 Low back pain, unspecified: Secondary | ICD-10-CM

## 2019-12-17 NOTE — Progress Notes (Signed)
No known injury Pain for 1.5 years No PT Medication such as gabapentin and mobic have helped  No cortisone injections

## 2019-12-17 NOTE — Progress Notes (Signed)
Office Visit Note   Patient: [name]           Date of Birth: 10/07/1965           MRN: [id number] Visit Date: 12/17/2019 Requested by: Scot Jun, [address] [place],  [place] [id number] PCP: Ladell Pier, MD  Subjective: Chief Complaint  Patient presents with  . Right Hip - Pain  . Left Hip - Pain  . Lower Back - Pain    HPI: 54yo F presenting to clinic with concerns of 1 year of bilateral hip pain. Patient states that her pain is 'debilitating,' describing that there are days when she needs her husband to help her get around the house or out of bed. The pain will often wake her from sleep. "I just can't deal with this anymore." Pain is described as deep within the hip joint, and occasionally radiating up into her back, or down into her knees. She also states she had tenderness on the outsides of her hips, but that this isn't the core of her pain. She states "It's like my hips lock, and then they cramp. And then I get a cramp on top of the cramp. And then a cramp on top of that cramp, and then I just can't move my hips at all!"  She was recently started on gabapentin, but states she hasn't noticed much improvement with this. She was also given a toradol injection, and states that this only offered very temporary improvement before her pain returned.  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 No numbness/tingling in her legs. No bowel/bladders dysfunction.               ROS:   All other systems were reviewed and are negative.  Objective: Vital Signs: LMP 04/23/2011   Physical Exam:  General:  Alert and oriented, in no acute distress. Pulm:  Breathing unlabored. Psy:  Normal mood, congruent affect. Skin:  No bruising, rashes, or other skin lesions appreciated.  Musculoskeletal: Normal gait. Normal spinal curvature.  Full ROM with hip flexion, extension.  Endorses significant deep pain with hip internal rotation. No back pain with forward flexion, extension, rotation or side-bending.  Seated exam: 5/5 strength with hip flexion/extension, knee flexion/extension, and ankle dorsi/plantar flexion.  Sensation intact to light touch throughout lower extremities.  No significant patellar crepitus appreciated with knee flexion/extension.  Supine exam:  Endorses deep pain with log roll bilaterally. FADIRS causes deep pain bilaterally, as well as lateral hip pain.  No pain with FABER bilaterally.  Significant Tenderness to palpation over Greater trochanteric area bilaterally, and positive ober's bilaterally.  SLR Negative.   Imaging: None Today. Previous Hip [facility] from [facility] reviewed.   Assessment & Plan: 54 yo F presenting to clinic with one year of severe deep hip pain, as well as lateral hip pain. Xrays obtained by [facility] were reviewed, which did demonstrate mild degenerative changes within the hip, which may underlie her symptoms. She also has significant lateral hip pain with palpation, however patient states this is much less troublesome to her than the deeper pain.  -Given her degree of discomfort, offered an intraarticular hip injection today, which patient eagerly agreed to.  -Procedure performed as described below, which patient tolerated very well. She voiced immediate improvement in her pain.  -Strict return precautions discussed.  -If lateral hip pain  remains significantly problematic, she is to return to clinic for reevaluation, and consideration of possible GTB injection.  -Patient had no further questions or concerns today.      Procedures: Bilateral Hip Cortisone Injection:  Risks and benefits or procedure discussed, Patient opted to proceed. Verbal Consent obtained.  Timeout performed.  Skin prepped in a sterile fashion with betadine before further cleansing with alcohol. Ethyl Chloride was used for topical analgesia.  Right Hip was  injected with 5cc 1% Lidocaine without epinephrineusing a spinal needle under ultrasound guidance. Syringe was removed from the needle, and 40mg  methylprednisolone was then injected into the joint.  Procedure was then repeated on the left hip.  Patient tolerated the injection well with no immediate complications. Aftercare instructions were discussed, and patient was given strict return precautions.      PMFS History: Patient Active Problem List   Diagnosis Date Noted  . 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 Graves disease 04/10/2019  . Prediabetes 10/18/2018  . Tinea pedis 07/31/2014  . Screening for HIV (human immunodeficiency virus) 07/31/2014  . Cramp of limb 01/13/2011  . Sarcoidosis of lung ([place]) 12/30/2010  . Rhinitis, allergic 11/29/2010  . Urticaria, idiopathic 10/27/2010  . COPD, mild ([place]) 08/06/2010  . Graves' disease 06/03/2010  . MIGRAINE HEADACHE 11/12/2008  . ROTATOR CUFF TEAR 11/12/2008  . DEPRESSION, RECURRENT 05/12/2008  . ANXIETY DISORDER, GENERALIZED 02/05/2007  . TOBACCO ABUSE 02/05/2007   Past Medical History:  Diagnosis Date  . Anxiety DX 2011  . Depression DX 1990  . 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 Grave's disease    Graves Disease  . History of radioactive iodine thyroid ablation    02/21/2019  . Menometrorrhagia   . Migraines     Family History  Problem Relation Age of Onset  . Hypertension Mother   . Heart attack Mother   . Hypertension Brother   . Hypertension Father   . Allergies Father   . Allergies Other        children  . Breast cancer Maternal Aunt   . Breast cancer Maternal Aunt     Past Surgical History:  Procedure Laterality Date  . ABDOMINAL HYSTERECTOMY    . TUBAL LIGATION     Social History   Occupational History  . Occupation: paper company    Comment: third shift  Tobacco Use  . Smoking status: Current Every Day Smoker    Packs/day: 0.50    Years: 32.00    Pack years: 16.00    Types: Cigarettes  .  Smokeless tobacco: Never Used  Vaping Use  . Vaping Use: Never used  Substance and Sexual Activity  . Alcohol use: Yes    Comment: occasional beer  . Drug use: No  . Sexual activity: Not on file

## 2019-12-17 NOTE — Progress Notes (Signed)
I saw and examined the patient with Dr. Elouise Munroe and agree with assessment and plan as outlined.    Chronic bilateral hip pain, left greater than right.  Mild DJD on x-ray.  Pain mostly in groin area.  Exam reveals pain with passive IR in both, and tenderness over GT.  Discussed options and elected to try diagnostic bilateral intra-articular hip injections.  Glucosamine, turmeric, exercise on bike.    If only short-term relief, then MRI.

## 2019-12-18 ENCOUNTER — Ambulatory Visit: Payer: 59 | Admitting: Internal Medicine

## 2020-01-21 ENCOUNTER — Encounter: Payer: Self-pay | Admitting: Family Medicine

## 2020-01-21 ENCOUNTER — Other Ambulatory Visit: Payer: Self-pay

## 2020-01-21 ENCOUNTER — Ambulatory Visit (INDEPENDENT_AMBULATORY_CARE_PROVIDER_SITE_OTHER): Payer: 59 | Admitting: Family Medicine

## 2020-01-21 DIAGNOSIS — M25551 Pain in right hip: Secondary | ICD-10-CM

## 2020-01-21 DIAGNOSIS — M25552 Pain in left hip: Secondary | ICD-10-CM | POA: Diagnosis not present

## 2020-01-21 MED ORDER — BACLOFEN 10 MG PO TABS: 5.0000 mg | ORAL_TABLET | Freq: Three times a day (TID) | ORAL | 3 refills | Status: DC | PRN

## 2020-01-21 MED ORDER — TRAMADOL HCL 50 MG PO TABS: 50.0000 mg | ORAL_TABLET | Freq: Four times a day (QID) | ORAL | 0 refills | Status: DC | PRN

## 2020-01-21 NOTE — Progress Notes (Signed)
Office Visit Note   Patient: [name]           Date of Birth: 08-05-1965           MRN: [id number] Visit Date: 01/21/2020 Requested by: Ladell Pier, MD [place],  [place] [id number] PCP: Ladell Pier, MD  Subjective: Chief Complaint  Patient presents with  . Left Hip - Pain  . Right Hip - Pain  . Lower Back - Pain    The bil cortisone injections given last month in each hip lasted for 1&1/2 weeks. For the last 4 days, she has not been able to sleep due to the pain. The only thing that relieves it some is BC powders - but has to take a lot of it.    HPI: 54yo F presenting to clinic with ongoing bilateral hip/low back pain. Patient previously seen approximately 1 mo ago, when she was given bilateral cortisone shots intraarticularly in both hips- she states that for about a week and a half she was 'Completely pain free.' Unfortunately, now her pain has returned and she is concerned that it is worse than it was previously. Pain is focused in her lower back, as well as the most lateral aspect in either of her hips. She states the pain will occasionally radiate down into her feet, but it is typically focused in her hips. Denies weakness, or bowel/bladder dysfunction. Pain is severe when sleeping on her side, forcing her to sleep upright in bed with pillow supports. She states when the pain really flares on her, it remains deep in her groin and 'enough to stop me in my tracks.'               ROS:   All other systems were reviewed and are negative.  Objective: Vital Signs: LMP 04/23/2011   Physical Exam:  General:  Alert and oriented, in no acute distress. Pulm:  Breathing unlabored. Psy:  Normal mood, congruent affect. Skin:  No bruises or rashes.  MSK:  Antalgic gait Endorses significant pain with internal rotation of the hip. External rotation without significant pain.  Tenderness to palpation along lumbar paraspinal muscles. No significant pain over  either SI Joint. Very tender over bilateraly greater trochanteric area and within the bilateral glutes/TFL area.  5/5 strength with hip flexion/abduction/adduction. 5/5 strength with knee flexion/extension, and ankle dorsi/plantar flexion. Sensation intact throughout B/L LE.    Imaging: No results found.  Assessment & Plan: 53yo F presenting to clinic with bilateral hip, lower back pain. Patient states she had complete resolution of her pain after bilateral hip intraarticular injections approximately 1 mo ago, however this was very short lived.  -Given her return to pain despite cortisone, will obtain a pelvic MRI to evaluate bilateral hip joint integrity, or other pathology that may be causing her significant pain.  -Return precautions discussed     Procedures: No procedures performed  No notes on file     PMFS History: Patient Active Problem List   Diagnosis Date Noted  . 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 Graves disease 04/10/2019  . Prediabetes 10/18/2018  . Tinea pedis 07/31/2014  . Screening for HIV (human immunodeficiency virus) 07/31/2014  . Cramp of limb 01/13/2011  . Sarcoidosis of lung ([place]) 12/30/2010  . Rhinitis, allergic 11/29/2010  . Urticaria, idiopathic 10/27/2010  . COPD, mild ([place]) 08/06/2010  . Graves' disease 06/03/2010  . MIGRAINE HEADACHE 11/12/2008  . ROTATOR CUFF TEAR 11/12/2008  . DEPRESSION, RECURRENT 05/12/2008  .  ANXIETY DISORDER, GENERALIZED 02/05/2007  . TOBACCO ABUSE 02/05/2007   Past Medical History:  Diagnosis Date  . Anxiety DX 2011  . Depression DX 1990  . 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 Grave's disease    Graves Disease  . History of radioactive iodine thyroid ablation    02/21/2019  . Menometrorrhagia   . Migraines     Family History  Problem Relation Age of Onset  . Hypertension Mother   . Heart attack Mother   . Hypertension Brother   . Hypertension Father   . Allergies Father   . Allergies Other         children  . Breast cancer Maternal Aunt   . Breast cancer Maternal Aunt     Past Surgical History:  Procedure Laterality Date  . ABDOMINAL HYSTERECTOMY    . TUBAL LIGATION     Social History   Occupational History  . Occupation: paper company    Comment: third shift  Tobacco Use  . Smoking status: Current Every Day Smoker    Packs/day: 0.50    Years: 32.00    Pack years: 16.00    Types: Cigarettes  . Smokeless tobacco: Never Used  Vaping Use  . Vaping Use: Never used  Substance and Sexual Activity  . Alcohol use: Yes    Comment: occasional beer  . Drug use: No  . Sexual activity: Not on file

## 2020-01-21 NOTE — Progress Notes (Signed)
I saw and examined the patient with Dr. Elouise Munroe and agree with assessment and plan as outlined.    Continued bilateral hip pain and low back pain.  She had complete relief of pain for 1 week after bilateral intra-articular hip injections, now is hurting worse than before.  Even low back pain was gone.  Exam reveals L5-S1 tenderness, bilateral greater troch tenderness, and pain with hip IR.  Will order MRI of pelvis to further evaluate.  Tramadol and baclofn as needed.

## 2020-01-23 ENCOUNTER — Ambulatory Visit: Payer: 59 | Admitting: Internal Medicine

## 2020-01-23 NOTE — Progress Notes (Deleted)
Name: [name]  MRN/ DOB: [id number], [birth year]    Age/ Sex: 54 y.o., female     PCP: Ladell Pier, MD   Reason for Endocrinology Evaluation: Lori Primas' Disease     Initial Endocrinology Clinic Visit: 01/09/2019    PATIENT IDENTIFIER: [name] is a 54 y.o., female with a past medical history of granulomatous lung disease, headaches, graves' disease and anxiety  . She has followed with [facility] since 01/09/2019  for consultative assistance with management of her graves' disease  HISTORICAL SUMMARY: The patient was first diagnosed with Graves' disease ~2012  .Over the years she has been prescribed  methimazole , but would take it on and off due to non-compliance issues , her thyroid condition has never been optimally controlled.   On her initial visit to our clinic she was not taking methimazole for ~ 3 months prior to her presentation.   She is S/P RAI ablation on 02/21/2019 with 13.8 mCi I-131 sodium iodide LT- 4 replacement started 04/2019 due to low FT4.   No FH of thyroid disease  Sister with lupus   SUBJECTIVE:   Today (01/23/2020):  [name] is here for a follow up on hyperthyroidism . She is S/P RAI ablation in 01/2019  Pt has gained 10 lbs since her last visit here.   She continues to have fatigue and constipation.  Has been stressed due to 56 yr old son with nephrotic syndrome  Has allergies of the eyes      ROS:  As per HPI.   HISTORY:  Past Medical History:  Past Medical History:  Diagnosis Date  . Anxiety DX 2011  . Depression DX 1990  . Granulomatous lung disease ([place])   . Grave's disease    Graves Disease  . History of radioactive iodine thyroid ablation    02/21/2019  . Menometrorrhagia   . Migraines    Past Surgical History:  Past Surgical History:  Procedure Laterality Date  . ABDOMINAL HYSTERECTOMY    . TUBAL LIGATION      Social History:  reports that she has been smoking cigarettes. She has a  16.00 pack-year smoking history. She has never used smokeless tobacco. She reports current alcohol use. She reports that she does not use drugs. Family History:  Family History  Problem Relation Age of Onset  . Hypertension Mother   . Heart attack Mother   . Hypertension Brother   . Hypertension Father   . Allergies Father   . Allergies Other        children  . Breast cancer Maternal Aunt   . Breast cancer Maternal Aunt      HOME MEDICATIONS: Allergies as of 01/23/2020   No Known Allergies     Medication List       Accurate as of January 23, 2020  7:13 AM. If you have any questions, ask your nurse or doctor.        aspirin EC 81 MG tablet Take 1 tablet (81 mg total) by mouth daily.   baclofen 10 MG tablet Commonly known as: LIORESAL Take 0.5-1 tablets (5-10 mg total) by mouth 3 (three) times daily as needed for muscle spasms.   BC HEADACHE PO Take 1 packet by mouth daily as needed (pain).   gabapentin 100 MG capsule Commonly known as: Neurontin Take 3 capsules (300 mg total) by mouth at bedtime. Take as needed for hip pain.   ibuprofen 800 MG tablet Commonly known as: ADVIL  Take 1 tablet (800 mg total) by mouth every 8 (eight) hours as needed.   levothyroxine 88 MCG tablet Commonly known as: SYNTHROID Take 1 tablet (88 mcg total) by mouth daily. Half a tablet on Sundays, 1 tablet rest of the week   meloxicam 7.5 MG tablet Commonly known as: MOBIC Take 1 tablet (7.5 mg total) by mouth daily as needed for pain (hip and back pain).   nicotine 14 mg/24hr patch Commonly known as: NICODERM CQ - dosed in mg/24 hours Place 1 patch (14 mg total) onto the skin daily.   traMADol 50 MG tablet Commonly known as: ULTRAM Take 1 tablet (50 mg total) by mouth every 6 (six) hours as needed.         OBJECTIVE:   PHYSICAL EXAM: VS: LMP 04/23/2011    EXAM: General: Pt appears well and is in NAD  Neck: General: Supple without adenopathy. Thyroid: Thyroid size  normal.  No goiter or nodules appreciated. No thyroid bruit.  Lungs: Clear with good BS bilat with no rales, rhonchi, or wheezes  Heart: Auscultation: RRR.  Abdomen: Normoactive bowel sounds, soft, nontender, without masses or organomegaly palpable  Extremities:  BL LE: No pretibial edema normal ROM and strength.  Mental Status: Judgment, insight: Intact Orientation: Oriented to time, place, and person Mood and affect: No depression, anxiety, or agitation     DATA REVIEWED: Results for [name], [name] (MRN [id number]) as of 08/08/2019 14:52  Ref. Range 04/09/2019 11:17 05/23/2019 09:55 08/08/2019 08:54  TSH Latest Ref Range: 0.35 - 4.50 uIU/mL <0.01 (L) 7.42 (H) 0.16 (L)  T4,Free(Direct) Latest Ref Range: 0.60 - 1.60 ng/dL 0.44 (L) 0.54 (L) 0.93      ASSESSMENT / PLAN / RECOMMENDATIONS:   1. Postablative Hypothyroidism  - She is S/P RAI Ablation on 02/21/2019 with 13.8 mCi I-131 sodium iodide - She is clinically euthyroid  - TSH low again with normal FT4, will adjust LT-4 replacement as below    Medications    Levothyroxine 88 mcg , Half a tablet on Sundays, 1 tablet the rest of the week     F/U in 4 months Labs in 8 weeks      Signed electronically by: Mack Guise, MD  [facility]  [address], [place] [address] Phone: [phone] FAX: [phone]      CC: Ladell Pier, MD [place] [address] Phone: [phone]  Fax: [phone]   Return to Endocrinology clinic as below: Future Appointments  Date Time Provider [place]  01/23/2020 11:10 AM Massiah Longanecker, Melanie Crazier, MD [facility] None  02/09/2020  3:30 PM GI-315 MR 2 [id number] [facility] [address]

## 2020-02-09 ENCOUNTER — Other Ambulatory Visit: Payer: Self-pay

## 2020-02-09 ENCOUNTER — Ambulatory Visit
Admission: RE | Admit: 2020-02-09 | Discharge: 2020-02-09 | Disposition: A | Discharge status: Home / Self Care | Payer: 59 | Source: Ambulatory Visit | Attending: Family Medicine | Admitting: Family Medicine

## 2020-02-09 DIAGNOSIS — M25552 Pain in left hip: Secondary | ICD-10-CM

## 2020-02-09 DIAGNOSIS — M25551 Pain in right hip: Secondary | ICD-10-CM

## 2020-02-10 ENCOUNTER — Telehealth: Payer: Self-pay | Admitting: Family Medicine

## 2020-02-10 DIAGNOSIS — G8929 Other chronic pain: Secondary | ICD-10-CM

## 2020-02-10 DIAGNOSIS — M25551 Pain in right hip: Secondary | ICD-10-CM

## 2020-02-10 NOTE — Telephone Encounter (Signed)
Pelvis MRI scan shows a slightly abnormal appearance of the bone marrow which could occur from a variety of different causes.  There is evidence of right-sided sacroiliitis, but there was no tenderness over the SI joint on examination.  At L5-S1, both nerves could be getting pinched due to disc bulge and bone spurring.  This could certainly be a source of pain.  Will order some labs to evaluate for causes of bone marrow changes.  Consider referral to Dr. Ernestina Patches for back injection.

## 2020-02-11 NOTE — Telephone Encounter (Signed)
I sent a separate MyChart to the patient.

## 2020-02-25 ENCOUNTER — Ambulatory Visit (INDEPENDENT_AMBULATORY_CARE_PROVIDER_SITE_OTHER): Payer: 59

## 2020-02-25 ENCOUNTER — Telehealth: Payer: Self-pay

## 2020-02-25 ENCOUNTER — Other Ambulatory Visit: Payer: Self-pay

## 2020-02-25 DIAGNOSIS — M545 Low back pain, unspecified: Secondary | ICD-10-CM

## 2020-02-25 DIAGNOSIS — R937 Abnormal findings on diagnostic imaging of other parts of musculoskeletal system: Secondary | ICD-10-CM

## 2020-02-25 DIAGNOSIS — M25552 Pain in left hip: Secondary | ICD-10-CM

## 2020-02-25 DIAGNOSIS — G8929 Other chronic pain: Secondary | ICD-10-CM

## 2020-02-25 DIAGNOSIS — M25551 Pain in right hip: Secondary | ICD-10-CM

## 2020-02-25 MED ORDER — TRAMADOL HCL 50 MG PO TABS: 50.0000 mg | ORAL_TABLET | Freq: Four times a day (QID) | ORAL | 0 refills | Status: DC | PRN

## 2020-02-25 NOTE — Progress Notes (Signed)
Blood drawn today to further evaluate findings on the MRI, per Dr. Junius Roads.

## 2020-02-25 NOTE — Telephone Encounter (Signed)
Both done 

## 2020-02-25 NOTE — Telephone Encounter (Signed)
The patient came in today for the blood work to assess causes for changes in the red bone marrow seen on the MRI. She requests 1) a refill on Tramadol and 2) would like to proceed with an injection with Dr. Ernestina Patches.

## 2020-02-26 ENCOUNTER — Telehealth: Payer: Self-pay | Admitting: Family Medicine

## 2020-02-26 LAB — CBC WITH DIFFERENTIAL/PLATELET
Absolute Monocytes: 396 cells/uL (ref 200–950)
Basophils Absolute: 32 cells/uL (ref 0–200)
Basophils Relative: 0.7 %
Eosinophils Absolute: 158 cells/uL (ref 15–500)
Eosinophils Relative: 3.5 %
HCT: 36.3 % (ref 35.0–45.0)
Hemoglobin: 11.9 g/dL (ref 11.7–15.5)
Lymphs Abs: 1742 cells/uL (ref 850–3900)
MCH: 28.6 pg (ref 27.0–33.0)
MCHC: 32.8 g/dL (ref 32.0–36.0)
MCV: 87.3 fL (ref 80.0–100.0)
MPV: 10.1 fL (ref 7.5–12.5)
Monocytes Relative: 8.8 %
Neutro Abs: 2174 cells/uL (ref 1500–7800)
Neutrophils Relative %: 48.3 %
Platelets: 350 10*3/uL (ref 140–400)
RBC: 4.16 10*6/uL (ref 3.80–5.10)
RDW: 13.3 % (ref 11.0–15.0)
Total Lymphocyte: 38.7 %
WBC: 4.5 10*3/uL (ref 3.8–10.8)

## 2020-02-26 LAB — VITAMIN B12: Vitamin B-12: 584 pg/mL (ref 200–1100)

## 2020-02-26 LAB — IRON,TIBC AND FERRITIN PANEL
%SAT: 11 % (calc) — ABNORMAL LOW (ref 16–45)
Ferritin: 27 ng/mL (ref 16–232)
Iron: 39 ug/dL — ABNORMAL LOW (ref 45–160)
TIBC: 369 mcg/dL (calc) (ref 250–450)

## 2020-02-26 LAB — VITAMIN D 25 HYDROXY (VIT D DEFICIENCY, FRACTURES): Vit D, 25-Hydroxy: 16 ng/mL — ABNORMAL LOW (ref 30–100)

## 2020-02-26 LAB — URIC ACID: Uric Acid, Serum: 1.8 mg/dL — ABNORMAL LOW (ref 2.5–7.0)

## 2020-02-26 LAB — C-REACTIVE PROTEIN: CRP: 9.3 mg/L — ABNORMAL HIGH (ref <8.0)

## 2020-02-26 LAB — ALKALINE PHOSPHATASE: Alkaline phosphatase (APISO): 78 U/L (ref 37–153)

## 2020-02-26 LAB — SEDIMENTATION RATE: Sed Rate: 9 mm/h (ref 0–30)

## 2020-02-26 LAB — LACTATE DEHYDROGENASE: LDH: 167 U/L (ref 120–250)

## 2020-02-26 NOTE — Telephone Encounter (Signed)
Labs are notable for the following:  Iron levels are low.  I recommend taking ferrous sulfate or ferrous gluconate at 325 mg daily.  Recheck in 3 to 4 months.  C-reactive protein, inflammation marker, is elevated slightly at 9.3.  This is probably related to the pain.  We should recheck this in 3 to 4 months.  Vitamin D level is very low at 16.  We want this to be 50-80.  I recommend taking vitamin D3, 5000 IU tablets, take 2 of them daily (total of 10,000 IU daily) for 3 months and then 1 daily long-term after that.  Recheck levels in 3 to 4 months.  All else looks good.

## 2020-03-31 ENCOUNTER — Encounter: Payer: Self-pay | Admitting: Physical Medicine and Rehabilitation

## 2020-03-31 ENCOUNTER — Ambulatory Visit (INDEPENDENT_AMBULATORY_CARE_PROVIDER_SITE_OTHER): Payer: 59 | Admitting: Physical Medicine and Rehabilitation

## 2020-03-31 ENCOUNTER — Ambulatory Visit: Payer: Self-pay

## 2020-03-31 ENCOUNTER — Other Ambulatory Visit: Payer: Self-pay

## 2020-03-31 VITALS — BP 125/88 | HR 78

## 2020-03-31 DIAGNOSIS — M5416 Radiculopathy, lumbar region: Secondary | ICD-10-CM

## 2020-03-31 MED ORDER — BETAMETHASONE SOD PHOS & ACET 6 (3-3) MG/ML IJ SUSP
12.0000 mg | Freq: Once | INTRAMUSCULAR | Status: AC
  Administered 2020-03-31: 12 mg

## 2020-03-31 NOTE — Progress Notes (Signed)
Pt state lower back pain that travels to both hips and groin area. Pt state that walking and standing for a long period of time makes the pain worse. Pt state she lays down and put her legs up and take pain meds to help ease the pain.  Numeric Pain Rating Scale and Functional Assessment Average Pain 10   In the last MONTH (on 0-10 scale) has pain interfered with the following?  1. General activity like being  able to carry out your everyday physical activities such as walking, climbing stairs, carrying groceries, or moving a chair?  Rating(10)   +Driver, -BT, -Dye Allergies.

## 2020-04-12 IMAGING — CR DG HIP (WITH OR WITHOUT PELVIS) 2-3V RIGHT
3 series · 3 of 3 positions shown · non-contrast
Comparison: None.

CLINICAL DATA: Right hip pain with limited range of motion.
Symptoms for 3 months. No injury.

EXAM:
DG HIP (WITH OR WITHOUT PELVIS) 2-3V RIGHT

[t pelvis ap]
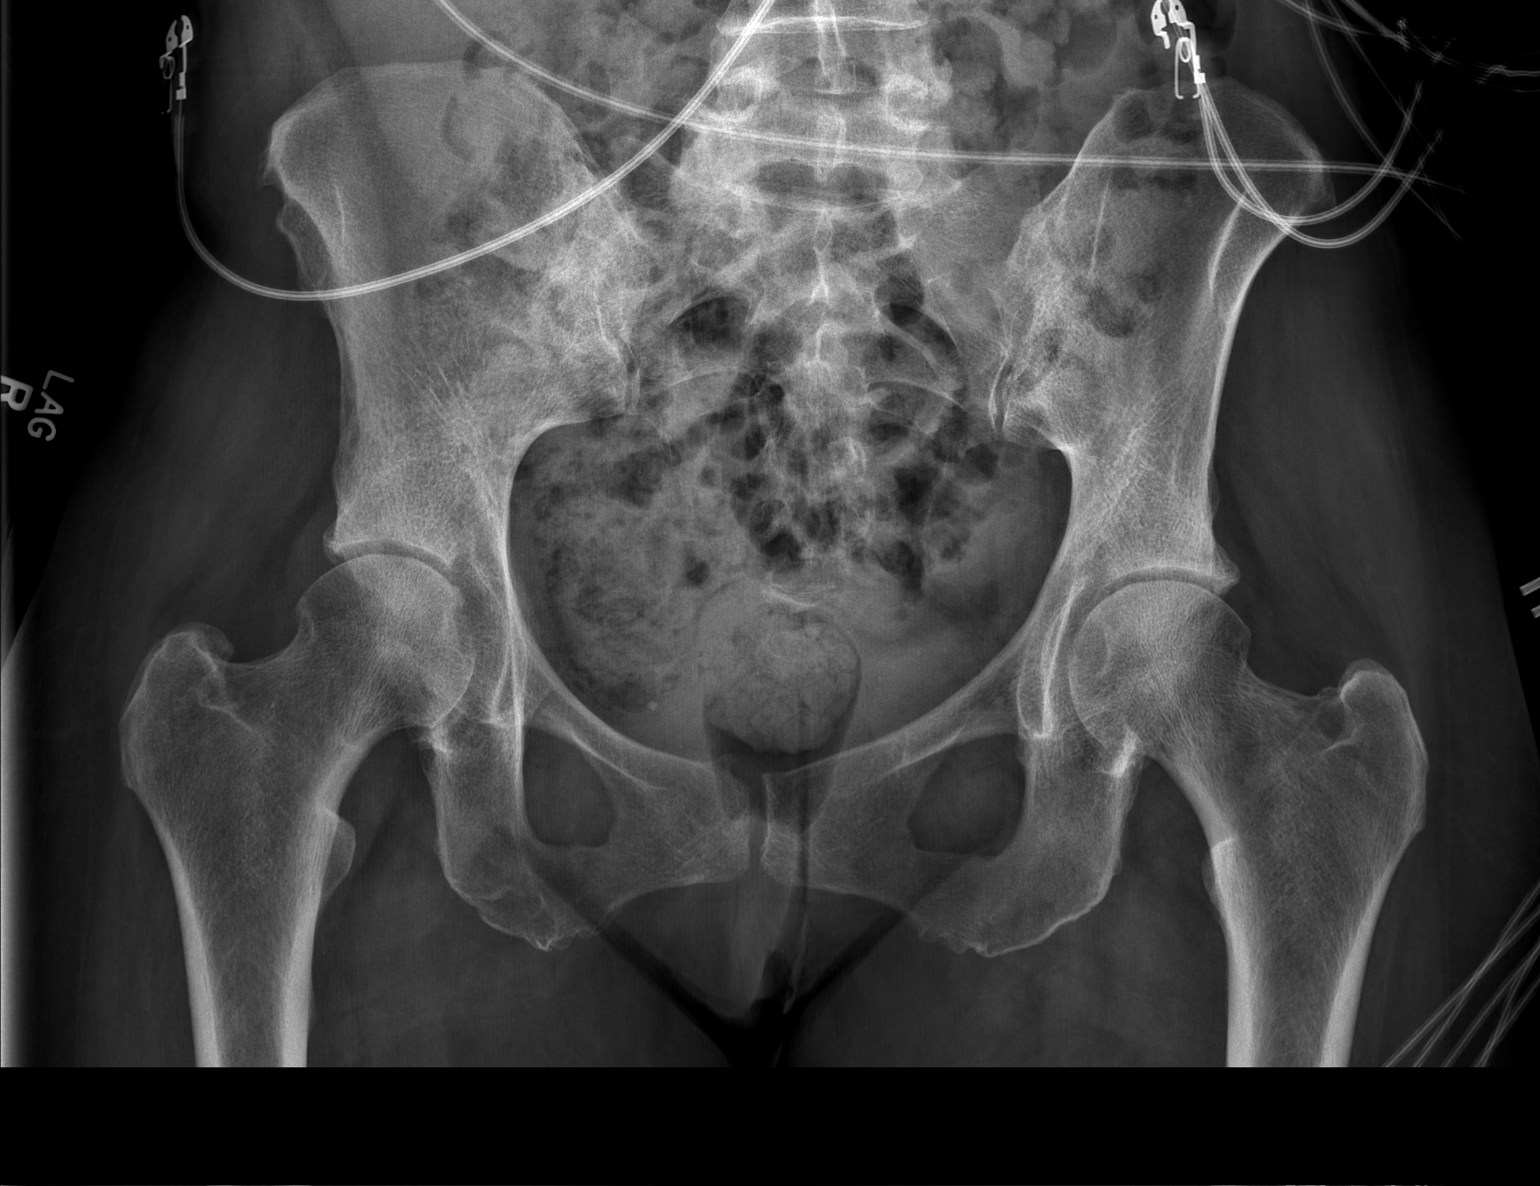

[t hip ap right]
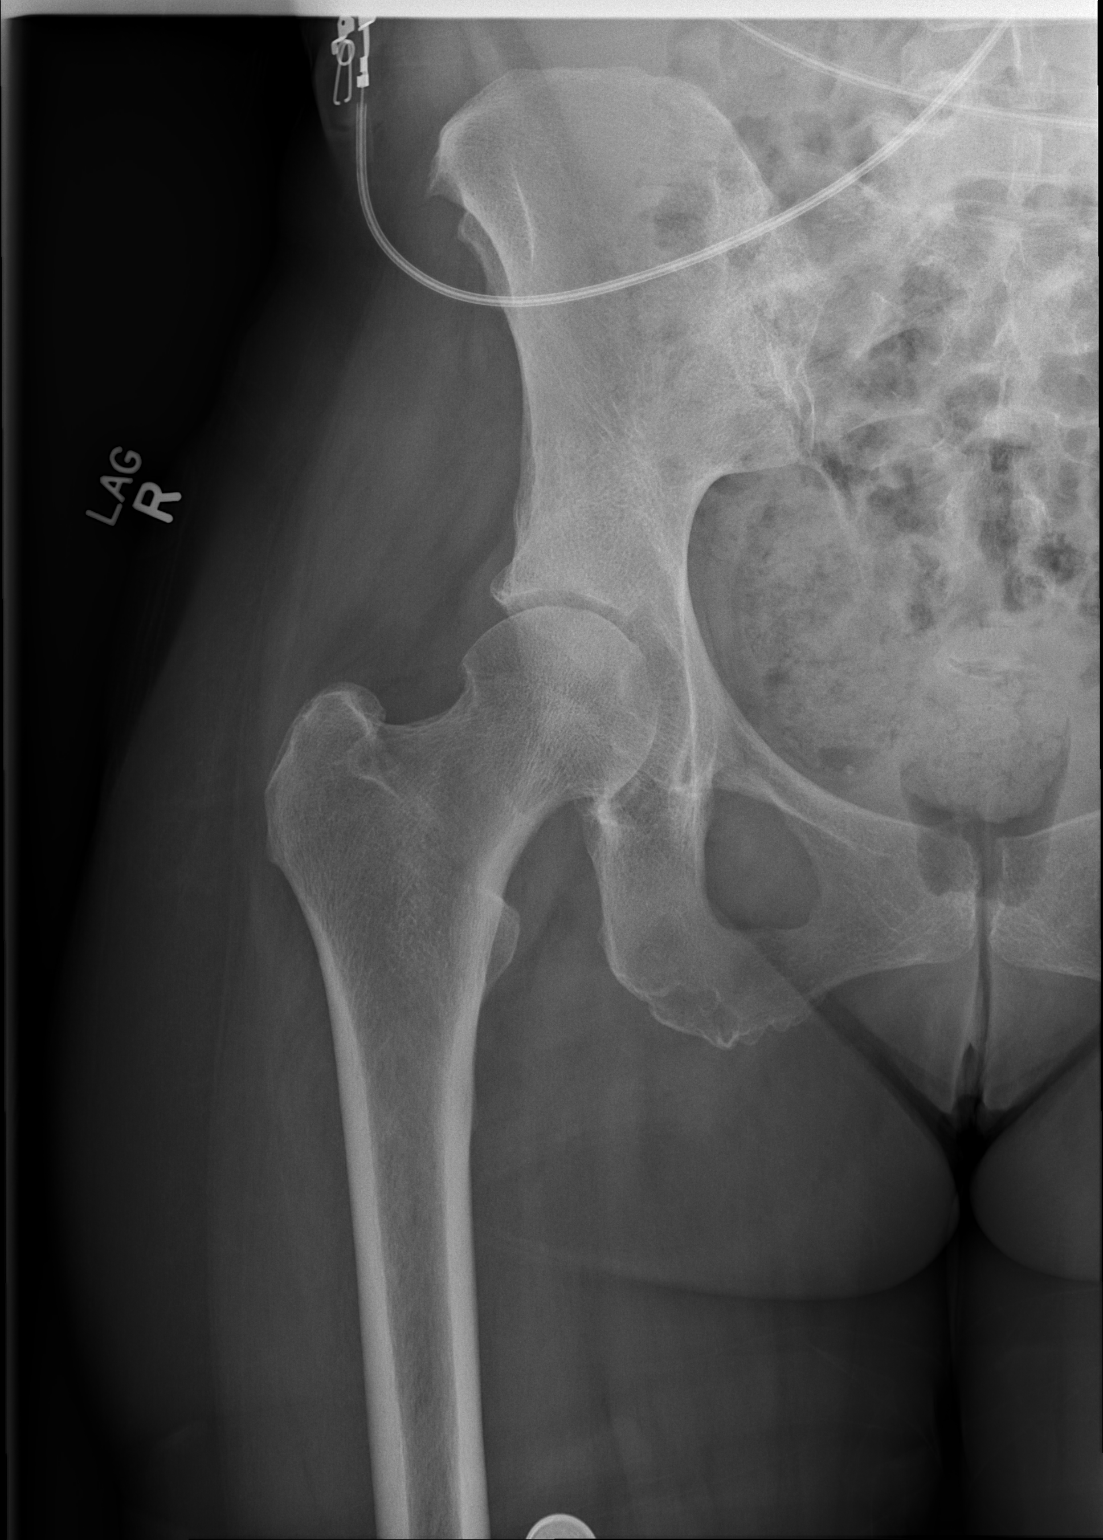

[t hip frog leg right]
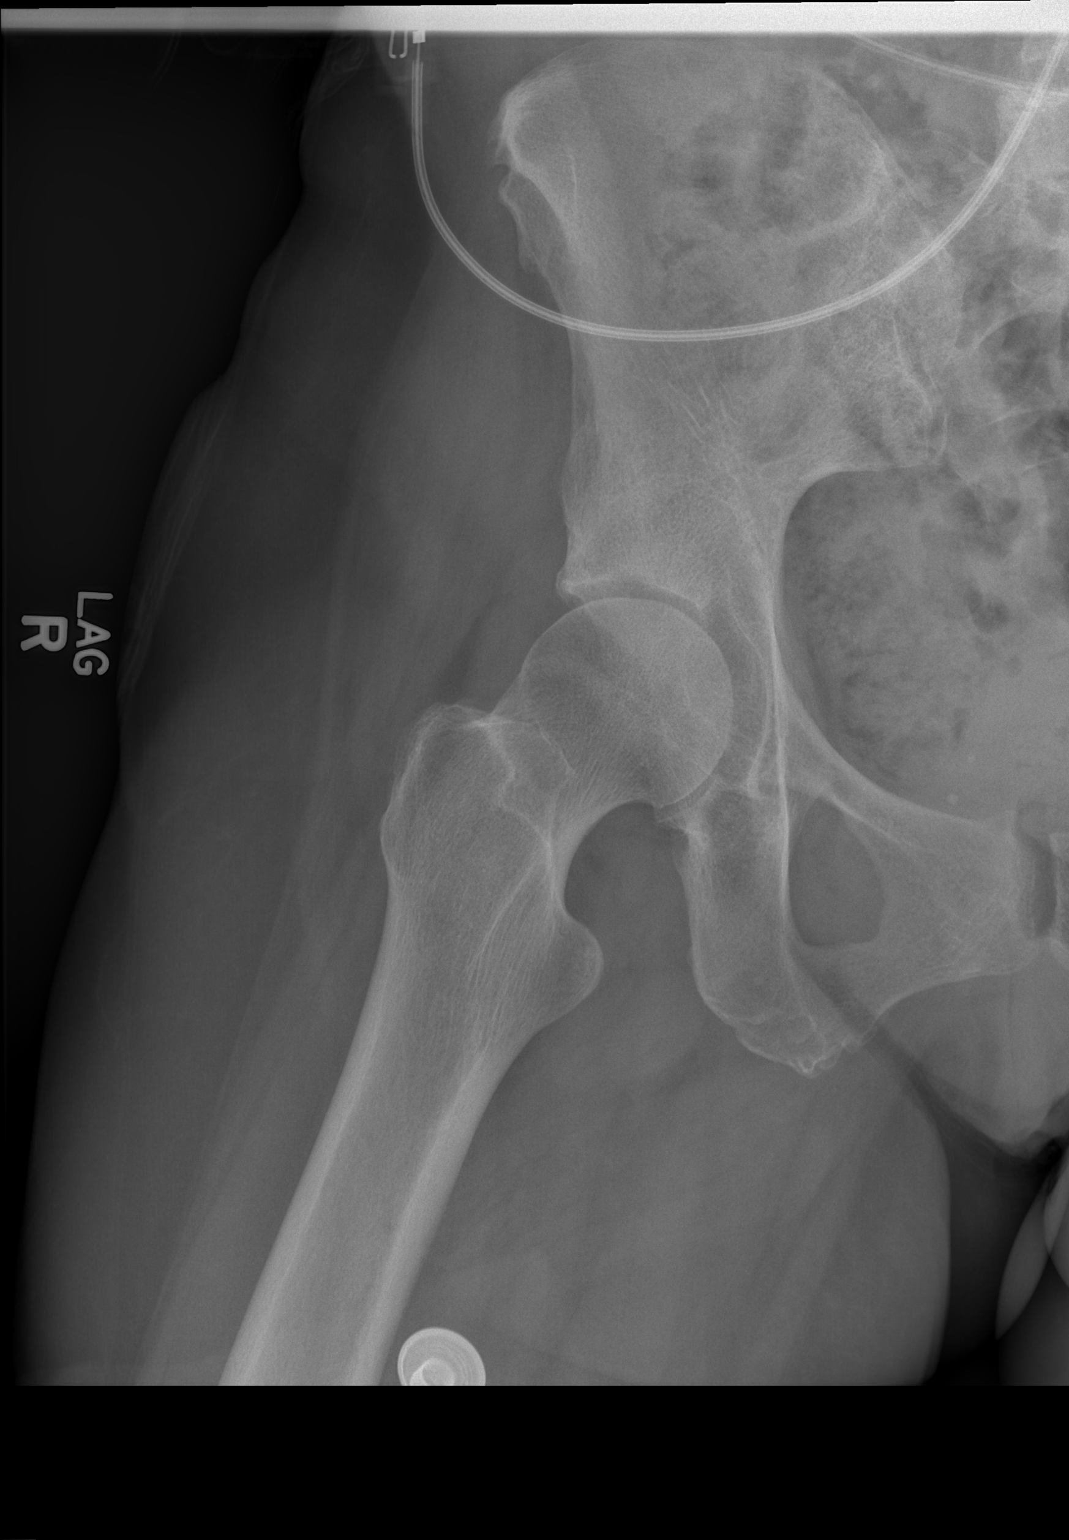

[3 of 3 positions shown; findings below may reference images not displayed]

FINDINGS: There is no evidence of hip fracture or dislocation. There is no
evidence of arthropathy or other focal bone abnormality.
IMPRESSION: Negative.

## 2020-04-13 ENCOUNTER — Other Ambulatory Visit: Payer: Self-pay | Admitting: Internal Medicine

## 2020-05-03 ENCOUNTER — Encounter: Payer: Self-pay | Admitting: Family Medicine

## 2020-05-03 ENCOUNTER — Other Ambulatory Visit: Payer: Self-pay

## 2020-05-03 ENCOUNTER — Ambulatory Visit (INDEPENDENT_AMBULATORY_CARE_PROVIDER_SITE_OTHER): Payer: 59 | Admitting: Family Medicine

## 2020-05-03 DIAGNOSIS — M545 Low back pain, unspecified: Secondary | ICD-10-CM | POA: Diagnosis not present

## 2020-05-03 DIAGNOSIS — M25552 Pain in left hip: Secondary | ICD-10-CM

## 2020-05-03 DIAGNOSIS — G8929 Other chronic pain: Secondary | ICD-10-CM | POA: Diagnosis not present

## 2020-05-03 DIAGNOSIS — M25551 Pain in right hip: Secondary | ICD-10-CM

## 2020-05-03 MED ORDER — BACLOFEN 20 MG PO TABS: 20.0000 mg | ORAL_TABLET | Freq: Three times a day (TID) | ORAL | 3 refills | Status: DC | PRN

## 2020-05-03 MED ORDER — TRAMADOL HCL 50 MG PO TABS
50.0000 mg | ORAL_TABLET | Freq: Four times a day (QID) | ORAL | 0 refills | Status: DC | PRN
Start: 2020-05-03 — End: 2021-04-04

## 2020-05-03 NOTE — Progress Notes (Signed)
Office Visit Note   Patient: [name]           Date of Birth: [birth year]           MRN: [id number] Visit Date: 05/03/2020 Requested by: Marcine Matar, MD [address] [place],  [place] [address] PCP: Marcine Matar, MD  Subjective: Chief Complaint  Patient presents with  . Lower Back - Pain, Follow-up    S/p ESI with Dr. Alvester Morin 03/31/20. The pain returned 9 days afterward. "My hips are on fire." Pain is going into the knees. Pain is even going up back to her neck. Out of her medications (tramadol, baclofen) Has not refilled the gabapentin nor meloxicam (from PCP). Had minimal relief from the gabapentin and none from the meloxicam.    HPI: She is here for follow-up low back and bilateral hip pain.  MRI scan showed L5-S1 disc bulge with facet arthropathy resulting in by foraminal impingement.  Recent epidural injection gave some relief for about 8 or 9 days, but then wore off just as before.  Pain radiates down both legs and also up the back toward the neck.  Medications are not making much difference for her.                ROS:   All other systems were reviewed and are negative.  Objective: Vital Signs: LMP 04/23/2011   Physical Exam:  General:  Alert and oriented, in no acute distress. Pulm:  Breathing unlabored. Psy:  Normal mood, congruent affect.  Low back: No significant midline tenderness.  She is tender today over the greater trochanter area of both hips.  Mild pain with internal rotation of both hips.  Negative straight leg raise, lower extremity strength and reflexes are normal.  Imaging: No results found.  Assessment & Plan: 1.  Continued bilateral hip pain, with underlying 5 foraminal impingement at L5-S1. -Discussed with her and elected to try physical therapy at [facility].  Refilled medications. -If she fails to improve, she would like to try chiropractic next.  She definitely wants surgery to be the last option for  her.     Procedures: No procedures performed        PMFS History: Patient Active Problem List   Diagnosis Date Noted  . 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 Graves disease 04/10/2019  . Prediabetes 10/18/2018  . Tinea pedis 07/31/2014  . Screening for HIV (human immunodeficiency virus) 07/31/2014  . Cramp of limb 01/13/2011  . Sarcoidosis of lung ([facility]) 12/30/2010  . Rhinitis, allergic 11/29/2010  . Urticaria, idiopathic 10/27/2010  . COPD, mild ([facility]) 08/06/2010  . Graves' disease 06/03/2010  . MIGRAINE HEADACHE 11/12/2008  . ROTATOR CUFF TEAR 11/12/2008  . DEPRESSION, RECURRENT 05/12/2008  . ANXIETY DISORDER, GENERALIZED 02/05/2007  . TOBACCO ABUSE 02/05/2007   Past Medical History:  Diagnosis Date  . Anxiety DX 2011  . Depression DX 1990  . Granulomatous lung disease ([facility])   . Grave's disease    Graves Disease  . History of radioactive iodine thyroid ablation    02/21/2019  . Menometrorrhagia   . Migraines     Family History  Problem Relation Age of Onset  . Hypertension Mother   . Heart attack Mother   . Hypertension Brother   . Hypertension Father   . Allergies Father   . Allergies Other        children  . Breast cancer Maternal Aunt   . Breast cancer Maternal Aunt  Past Surgical History:  Procedure Laterality Date  . ABDOMINAL HYSTERECTOMY    . TUBAL LIGATION     Social History   Occupational History  . Occupation: paper company    Comment: third shift  Tobacco Use  . Smoking status: Current Every Day Smoker    Packs/day: 0.50    Years: 32.00    Pack years: 16.00    Types: Cigarettes  . Smokeless tobacco: Never Used  Vaping Use  . Vaping Use: Never used  Substance and Sexual Activity  . Alcohol use: Yes    Comment: occasional beer  . Drug use: No  . Sexual activity: Not on file

## 2020-05-11 ENCOUNTER — Encounter: Payer: Self-pay | Admitting: Physical Therapy

## 2020-05-11 ENCOUNTER — Ambulatory Visit: Payer: 59 | Attending: Family Medicine | Admitting: Physical Therapy

## 2020-05-11 ENCOUNTER — Other Ambulatory Visit: Payer: Self-pay

## 2020-05-11 DIAGNOSIS — M5441 Lumbago with sciatica, right side: Secondary | ICD-10-CM | POA: Insufficient documentation

## 2020-05-11 DIAGNOSIS — G8929 Other chronic pain: Secondary | ICD-10-CM | POA: Diagnosis present

## 2020-05-11 DIAGNOSIS — M5442 Lumbago with sciatica, left side: Secondary | ICD-10-CM | POA: Insufficient documentation

## 2020-05-11 DIAGNOSIS — M25552 Pain in left hip: Secondary | ICD-10-CM | POA: Insufficient documentation

## 2020-05-11 DIAGNOSIS — M6281 Muscle weakness (generalized): Secondary | ICD-10-CM | POA: Insufficient documentation

## 2020-05-11 DIAGNOSIS — M25551 Pain in right hip: Secondary | ICD-10-CM | POA: Diagnosis present

## 2020-05-11 NOTE — Patient Instructions (Signed)
Access Code: [email or web address] URL: [email or web address] Date: 05/11/2020 Prepared by: Amador Cunas  Exercises Supine Lower Trunk Rotation - 1 x daily - 7 x weekly - 3 sets - 10 reps Hip Flexor Stretch at Edge of Bed - 1 x daily - 7 x weekly - 3 sets - 10 reps Supine Hip Adductor Stretch - 1 x daily - 7 x weekly - 3 sets - 2 reps - 15-20 sec hold Seated Table Hamstring Stretch - 1 x daily - 7 x weekly - 3 sets - 2 reps - 15-20 sec hold Seated Piriformis Stretch - 1 x daily - 7 x weekly - 3 sets - 2 reps - 15-20 sec hold Seated Piriformis Stretch - 1 x daily - 7 x weekly - 3 sets - 2 reps - 15-20 sec hold
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
## 2020-05-12 ENCOUNTER — Encounter: Payer: Self-pay | Admitting: Physical Therapy

## 2020-05-12 ENCOUNTER — Ambulatory Visit: Payer: 59 | Admitting: Physical Therapy

## 2020-05-12 DIAGNOSIS — M25552 Pain in left hip: Secondary | ICD-10-CM

## 2020-05-12 DIAGNOSIS — M6281 Muscle weakness (generalized): Secondary | ICD-10-CM

## 2020-05-12 DIAGNOSIS — G8929 Other chronic pain: Secondary | ICD-10-CM

## 2020-05-12 DIAGNOSIS — M5442 Lumbago with sciatica, left side: Secondary | ICD-10-CM

## 2020-05-12 DIAGNOSIS — M25551 Pain in right hip: Secondary | ICD-10-CM

## 2020-05-12 NOTE — Therapy (Signed)
[facility]. [place], [place], [address] Phone: [phone]   Fax:  [phone]  Physical Therapy Treatment  Patient Details  Name: [name] MRN: [id number] Date of Birth: [birth year] Referring Provider (PT): Hilts   Encounter Date: 05/12/2020   PT End of Session - 05/12/20 1643    Visit Number 2    Date for PT Re-Evaluation 07/09/20    PT Start Time 1600    PT Stop Time 1644    PT Time Calculation (min) 44 min    Activity Tolerance Patient tolerated treatment well    Behavior During Therapy [facility] for tasks assessed/performed           Past Medical History:  Diagnosis Date  . Anxiety DX 2011  . Depression DX 1990  . 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 Grave's disease    Graves Disease  . History of radioactive iodine thyroid ablation    02/21/2019  . Menometrorrhagia   . Migraines     Past Surgical History:  Procedure Laterality Date  . ABDOMINAL HYSTERECTOMY    . TUBAL LIGATION      There were no vitals filed for this visit.   Subjective Assessment - 05/12/20 1559    Subjective Having a little pain in the hips and back.    Currently in Pain? Yes    Pain Score 4     Pain Location Back                             [facility] Adult PT Treatment/Exercise - 05/12/20 0001      Lumbar Exercises: Stretches   Passive Hamstring Stretch Left;Right;3 reps;10 seconds    Single Knee to Chest Stretch Right;Left;3 reps;20 seconds    Piriformis Stretch Right;Left;3 reps;20 seconds      Lumbar Exercises: Aerobic   Nustep L3 x 4 min      Lumbar Exercises: Seated   Sit to Stand 5 reps   x2   Other Seated Lumbar Exercises rows yellow 2x10    Other Seated Lumbar Exercises OHP yellw ball 2x10      Lumbar Exercises: Supine   Bridge 2 seconds;10 reps    Other Supine Lumbar Exercises LE on pball bridges , K2C      Knee/Hip Exercises: Machines for Strengthening   Cybex Knee Extension  5lb 2x10    Cybex Knee Flexion 20lb2x10                    PT Short Term Goals - 05/12/20 1644      PT SHORT TERM GOAL #1   Title Pt will be I with initial HEP    Status Achieved             PT Long Term Goals - 05/11/20 1623      PT LONG TERM GOAL #1   Title Pt will report 50% reduction in LB and hip pain    Time 6    Period Weeks    Status New    Target Date 06/22/20      PT LONG TERM GOAL #2   Title Pt will demo SLR to 90 deg B    Baseline 60 deg B    Time 6    Period Weeks    Status New    Target Date 06/22/20      PT LONG TERM GOAL #3   Title  Pt will demo 5x STS WFL with </= 1/10 LB and hip pain    Time 6    Period Weeks    Status New    Target Date 06/22/20      PT LONG TERM GOAL #4   Title Pt will demo able to ascend/descend stairs step over step with </= 2/10 LB and hip pain    Time 6    Period Weeks    Status New    Target Date 06/22/20                 Plan - 05/12/20 1645    Clinical Impression Statement Pt tolerated an initial progression to TE well. She has some hesitation to movement and activity but eventually settled down sone she got started. Postural cue needed with seated rows. Slight knee adduction noted with sit to stands. LE fatigued quick with seated curls and extensions. She had good HS flexibility but some piriformis tightness bilaterally.    Personal Factors and Co morbidities Comorbidity 2    Comorbidities graves disease, HTN    Examination-Activity Limitations Bend;Carry;Locomotion Level;Stairs    Examination-Participation Restrictions Community Activity;Interpersonal Relationship;Occupation    Stability/Clinical Decision Making Stable/Uncomplicated    Rehab Potential Good    PT Frequency 2x / week    PT Duration 6 weeks    PT Treatment/Interventions ADLs/Self Care Home Management;Electrical Stimulation;Iontophoresis 4mg /ml Dexamethasone;Moist Heat;Stair training;Therapeutic activities;Therapeutic  exercise;Neuromuscular re-education;Manual techniques;Patient/family education;Dry needling    PT Next Visit Plan gym activities, LE flexibility, manual/modalities as indicated, stair training           Patient will benefit from skilled therapeutic intervention in order to improve the following deficits and impairments:  Abnormal gait,Decreased range of motion,Difficulty walking,Decreased endurance,Increased muscle spasms,Decreased activity tolerance,Pain,Impaired flexibility,Decreased strength,Postural dysfunction  Visit Diagnosis: Pain in left hip  Pain in right hip  Muscle weakness (generalized)  Chronic bilateral low back pain with bilateral sciatica     Problem List Patient Active Problem List   Diagnosis Date Noted  . 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 Graves disease 04/10/2019  . Prediabetes 10/18/2018  . Tinea pedis 07/31/2014  . Screening for HIV (human immunodeficiency virus) 07/31/2014  . Cramp of limb 01/13/2011  . Sarcoidosis of lung ([place]) 12/30/2010  . Rhinitis, allergic 11/29/2010  . Urticaria, idiopathic 10/27/2010  . COPD, mild ([place]) 08/06/2010  . Graves' disease 06/03/2010  . MIGRAINE HEADACHE 11/12/2008  . ROTATOR CUFF TEAR 11/12/2008  . DEPRESSION, RECURRENT 05/12/2008  . ANXIETY DISORDER, GENERALIZED 02/05/2007  . TOBACCO ABUSE 02/05/2007    Scot Jun, PTA 05/12/2020, 4:50 PM  [facility] [address] [place], [place], [address] Phone: [phone]   Fax:  [phone]  Name: [name] MRN: [id number] Date of Birth: Feb 06, 1966

## 2020-05-19 ENCOUNTER — Ambulatory Visit: Payer: 59 | Admitting: Physical Therapy

## 2020-05-19 ENCOUNTER — Other Ambulatory Visit: Payer: Self-pay

## 2020-05-19 ENCOUNTER — Encounter: Payer: Self-pay | Admitting: Physical Therapy

## 2020-05-19 DIAGNOSIS — M25551 Pain in right hip: Secondary | ICD-10-CM

## 2020-05-19 DIAGNOSIS — G8929 Other chronic pain: Secondary | ICD-10-CM

## 2020-05-19 DIAGNOSIS — M5442 Lumbago with sciatica, left side: Secondary | ICD-10-CM | POA: Diagnosis not present

## 2020-05-19 DIAGNOSIS — M5441 Lumbago with sciatica, right side: Secondary | ICD-10-CM

## 2020-05-19 DIAGNOSIS — M6281 Muscle weakness (generalized): Secondary | ICD-10-CM

## 2020-05-19 DIAGNOSIS — M25552 Pain in left hip: Secondary | ICD-10-CM

## 2020-05-19 NOTE — Therapy (Signed)
[facility]. [place], [place], [address] Phone: [phone]   Fax:  [phone]  Physical Therapy Treatment  Patient Details  Name: [name] MRN: [id number] Date of Birth: 20-Dec-1965 Referring Provider (PT): Hilts   Encounter Date: 05/19/2020   PT End of Session - 05/19/20 7026    Visit Number 3    Date for PT Re-Evaluation 07/09/20    PT Start Time 1555    PT Stop Time 1645    PT Time Calculation (min) 50 min    Activity Tolerance Patient tolerated treatment well    Behavior During Therapy [facility] for tasks assessed/performed           Past Medical History:  Diagnosis Date  . Anxiety DX 2011  . Depression DX 1990  . 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 Grave's disease    Graves Disease  . History of radioactive iodine thyroid ablation    02/21/2019  . Menometrorrhagia   . Migraines     Past Surgical History:  Procedure Laterality Date  . ABDOMINAL HYSTERECTOMY    . TUBAL LIGATION      There were no vitals filed for this visit.   Subjective Assessment - 05/19/20 1559    Subjective Pt reports that she is tired. 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    Currently in Pain? Yes    Pain Score 8    been on her feet a lot                            [facility] Adult PT Treatment/Exercise - 05/19/20 0001      Lumbar Exercises: Stretches   Single Knee to Chest Stretch Right;Left;20 seconds;30 seconds;4 reps    Double Knee to Chest Stretch 4 reps;20 seconds    Lower Trunk Rotation 4 reps;20 seconds;10 seconds      Lumbar Exercises: Aerobic   Nustep L4 x 6 min      Lumbar Exercises: Machines for Strengthening   Cybex Lumbar Extension back Ext black band 2x10    Other Lumbar Machine Exercise Row & Lats 20lb 2x10      Lumbar Exercises: Standing   Row Theraband;20 reps;Both;Strengthening    Theraband Level (Row) Level 2 (Red)    Shoulder  Extension Strengthening;Theraband;20 reps;Both    Shoulder Extension Limitations 5      Lumbar Exercises: Supine   Other Supine Lumbar Exercises LE on pball bridges , K2C      Knee/Hip Exercises: Machines for Strengthening   Cybex Knee Extension 5lb 2x10    Cybex Knee Flexion 20lb2x10                    PT Short Term Goals - 05/12/20 1644      PT SHORT TERM GOAL #1   Title Pt will be I with initial HEP    Status Achieved             PT Long Term Goals - 05/19/20 1648      PT LONG TERM GOAL #1   Title Pt will report 50% reduction in LB and hip pain    Status On-going      PT LONG TERM GOAL #2   Title Pt will demo SLR to 90 deg B    Status On-going  Plan - 05/19/20 1648    Clinical Impression Statement Pt did well with a progressed treatment session despite reports of fatigue. Progressed to seated rows and lats needing cues for both core engagement and posture. Tactile cues needed with standing shoulder ext to prevent forward trunk flexion. LE burning reported with leg curls and extensions.    Personal Factors and Comorbidities Comorbidity 2    Comorbidities graves disease, HTN    Examination-Activity Limitations Bend;Carry;Locomotion Level;Stairs    Examination-Participation Restrictions Community Activity;Interpersonal Relationship;Occupation    Stability/Clinical Decision Making Stable/Uncomplicated    Rehab Potential Good    PT Frequency 2x / week    PT Duration 6 weeks    PT Treatment/Interventions ADLs/Self Care Home Management;Electrical Stimulation;Iontophoresis 4mg /ml Dexamethasone;Moist Heat;Stair training;Therapeutic activities;Therapeutic exercise;Neuromuscular re-education;Manual techniques;Patient/family education;Dry needling    PT Next Visit Plan gym activities, LE flexibility, manual/modalities as indicated, stair training           Patient will benefit from skilled therapeutic intervention in order to improve the  following deficits and impairments:  Abnormal gait,Decreased range of motion,Difficulty walking,Decreased endurance,Increased muscle spasms,Decreased activity tolerance,Pain,Impaired flexibility,Decreased strength,Postural dysfunction  Visit Diagnosis: Pain in left hip  Pain in right hip  Muscle weakness (generalized)  Chronic bilateral low back pain with bilateral sciatica     Problem List Patient Active Problem List   Diagnosis Date Noted  . 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 Graves disease 04/10/2019  . Prediabetes 10/18/2018  . Tinea pedis 07/31/2014  . Screening for HIV (human immunodeficiency virus) 07/31/2014  . Cramp of limb 01/13/2011  . Sarcoidosis of lung ([place]) 12/30/2010  . Rhinitis, allergic 11/29/2010  . Urticaria, idiopathic 10/27/2010  . COPD, mild ([place]) 08/06/2010  . Graves' disease 06/03/2010  . MIGRAINE HEADACHE 11/12/2008  . ROTATOR CUFF TEAR 11/12/2008  . DEPRESSION, RECURRENT 05/12/2008  . ANXIETY DISORDER, GENERALIZED 02/05/2007  . TOBACCO ABUSE 02/05/2007    Scot Jun, PTA 05/19/2020, 4:52 PM  [facility]. [place], [place], [address] Phone: [phone]   Fax:  [phone]  Name: AMANDEEP HOGSTON MRN: [id number] Date of Birth: 1965/07/24

## 2020-05-26 ENCOUNTER — Ambulatory Visit: Payer: 59 | Admitting: Physical Therapy

## 2020-06-02 ENCOUNTER — Ambulatory Visit: Payer: 59 | Attending: Family Medicine | Admitting: Physical Therapy

## 2020-06-02 ENCOUNTER — Other Ambulatory Visit: Payer: Self-pay

## 2020-06-02 ENCOUNTER — Encounter: Payer: Self-pay | Admitting: Physical Therapy

## 2020-06-02 DIAGNOSIS — M5441 Lumbago with sciatica, right side: Secondary | ICD-10-CM | POA: Diagnosis present

## 2020-06-02 DIAGNOSIS — M6281 Muscle weakness (generalized): Secondary | ICD-10-CM | POA: Diagnosis present

## 2020-06-02 DIAGNOSIS — M25552 Pain in left hip: Secondary | ICD-10-CM | POA: Diagnosis not present

## 2020-06-02 DIAGNOSIS — G8929 Other chronic pain: Secondary | ICD-10-CM | POA: Diagnosis present

## 2020-06-02 DIAGNOSIS — M25551 Pain in right hip: Secondary | ICD-10-CM | POA: Insufficient documentation

## 2020-06-02 DIAGNOSIS — M5442 Lumbago with sciatica, left side: Secondary | ICD-10-CM | POA: Insufficient documentation

## 2020-06-02 NOTE — Therapy (Signed)
[facility]. [place], [place], [address] Phone: [phone]   Fax:  [phone]  Physical Therapy Treatment  Patient Details  Name: [name] MRN: [id number] Date of Birth: [birth year] Referring Provider (PT): Hilts   Encounter Date: 06/02/2020   PT End of Session - 06/02/20 1646    Visit Number 4    Date for PT Re-Evaluation 07/09/20    PT Start Time 1600    PT Stop Time 1642    PT Time Calculation (min) 42 min    Activity Tolerance Patient tolerated treatment well    Behavior During Therapy [facility] for tasks assessed/performed           Past Medical History:  Diagnosis Date  . Anxiety DX 2011  . Depression DX 1990  . 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 Grave's disease    Graves Disease  . History of radioactive iodine thyroid ablation    02/21/2019  . Menometrorrhagia   . Migraines     Past Surgical History:  Procedure Laterality Date  . ABDOMINAL HYSTERECTOMY    . TUBAL LIGATION      There were no vitals filed for this visit.   Subjective Assessment - 06/02/20 1555    Subjective Ok, pain at night in the hip and groin area wakes her up    Currently in Pain? Yes    Pain Score 2     Pain Location Groin              [facility] PT Assessment - 06/02/20 0001      AROM   Overall AROM Comments Lumbar ROM WFL                         [facility] Adult PT Treatment/Exercise - 06/02/20 0001      Lumbar Exercises: Stretches   Double Knee to Chest Stretch 4 reps;20 seconds    Piriformis Stretch Right;Left;3 reps;20 seconds    Other Lumbar Stretch Exercise Hip abduction and IR stretch      Lumbar Exercises: Aerobic   Recumbent Bike L2 x4 min    Nustep L4 x 6 min      Lumbar Exercises: Machines for Strengthening   Other Lumbar Machine Exercise Row & Lats 25lb 2x10      Lumbar Exercises: Standing   Shoulder Extension Strengthening;Theraband;20 reps;Both    Shoulder Extension  Limitations 5      Knee/Hip Exercises: Machines for Strengthening   Cybex Knee Flexion 20lb2x10                    PT Short Term Goals - 05/12/20 1644      PT SHORT TERM GOAL #1   Title Pt will be I with initial HEP    Status Achieved             PT Long Term Goals - 06/02/20 1633      PT LONG TERM GOAL #2   Title Pt will demo SLR to 90 deg B    Status Partially Met   81R 90L                Plan - 06/02/20 1648    Clinical Impression Statement Pt has progressed increasing her lumbar ROM meeting goal. She has increase her SLR on both side as well meeting goal with LLE. She reports better tolerance to stair negotiation at work. Bilateral ER/IR hip tightness noted  with stretching. tactile cues for posture needed with shoulder extensions,    Personal Factors and Comorbidities Comorbidity 2    Comorbidities graves disease, HTN    Examination-Activity Limitations Bend;Carry;Locomotion Level;Stairs    Examination-Participation Restrictions Community Activity;Interpersonal Relationship;Occupation    Stability/Clinical Decision Making Stable/Uncomplicated    Rehab Potential Good    PT Frequency 2x / week    PT Duration 6 weeks    PT Treatment/Interventions ADLs/Self Care Home Management;Electrical Stimulation;Iontophoresis 20m/ml Dexamethasone;Moist Heat;Stair training;Therapeutic activities;Therapeutic exercise;Neuromuscular re-education;Manual techniques;Patient/family education;Dry needling    PT Next Visit Plan gym activities, LE flexibility, manual/modalities as indicated, stair training           Patient will benefit from skilled therapeutic intervention in order to improve the following deficits and impairments:  Abnormal gait,Decreased range of motion,Difficulty walking,Decreased endurance,Increased muscle spasms,Decreased activity tolerance,Pain,Impaired flexibility,Decreased strength,Postural dysfunction  Visit Diagnosis: Pain in left hip  Pain in  right hip  Muscle weakness (generalized)  Chronic bilateral low back pain with bilateral sciatica     Problem List Patient Active Problem List   Diagnosis Date Noted  . 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 Graves disease 04/10/2019  . Prediabetes 10/18/2018  . Tinea pedis 07/31/2014  . Screening for HIV (human immunodeficiency virus) 07/31/2014  . Cramp of limb 01/13/2011  . Sarcoidosis of lung [place] 12/30/2010  . Rhinitis, allergic 11/29/2010  . Urticaria, idiopathic 10/27/2010  . COPD, mild ([place] 08/06/2010  . Graves' disease 06/03/2010  . MIGRAINE HEADACHE 11/12/2008  . ROTATOR CUFF TEAR 11/12/2008  . DEPRESSION, RECURRENT 05/12/2008  . ANXIETY DISORDER, GENERALIZED 02/05/2007  . TOBACCO ABUSE 02/05/2007    RScot Jun PTA 06/02/2020, 4:51 PM  [place] [address]Phone: [phone]  Fax:  [phone] Name: Lori CASSITYMRN: [id number] of Birth: 410-30-1967

## 2020-06-06 NOTE — Progress Notes (Signed)
[name] - 55 y.o. female MRN [id number]  Date of birth: 20-Dec-1965  Office Visit Note: Visit Date: 03/31/2020 PCP: Ladell Pier, MD Referred by: Ladell Pier, MD  Subjective: Chief Complaint  Patient presents with  . Lower Back - Pain  . Left Hip - Pain  . Right Hip - Pain   HPI:  [name] is a 55 y.o. female who comes in today at the request of Dr. Eunice Blase for planned Right L5-S1 Lumbar epidural steroid injection with fluoroscopic guidance.  The patient has failed conservative care including home exercise, medications, time and activity modification.  This injection will be diagnostic and hopefully therapeutic.  Please see requesting physician notes for further details and justification.   ROS Otherwise per HPI.  Assessment & Plan: Visit Diagnoses:    ICD-10-CM   1. Lumbar radiculopathy  M54.16 XR C-ARM NO REPORT    Epidural Steroid injection    betamethasone acetate-betamethasone sodium phosphate (CELESTONE) injection 12 mg    Plan: No additional findings.   Meds & Orders:  Meds ordered this encounter  Medications  . betamethasone acetate-betamethasone sodium phosphate (CELESTONE) injection 12 mg    Orders Placed This Encounter  Procedures  . XR C-ARM NO REPORT  . Epidural Steroid injection    Follow-up: Return if symptoms worsen or fail to improve.   Procedures: No procedures performed  Lumbar Epidural Steroid Injection - Interlaminar Approach with Fluoroscopic Guidance  Patient: [name]      Date of Birth: 08-01-1965 MRN: [id number] PCP: Ladell Pier, MD      Visit Date: 03/31/2020   Universal Protocol:     Consent Given By: the patient  Position: PRONE  Additional Comments: Vital signs were monitored before and after the procedure. Patient was prepped and draped in the usual sterile fashion. The correct patient, procedure, and site was verified.   Injection Procedure Details:   Procedure diagnoses:  Lumbar radiculopathy [M54.16]   Meds Administered:  Meds ordered this encounter  Medications  . betamethasone acetate-betamethasone sodium phosphate (CELESTONE) injection 12 mg     Laterality: Right  Location/Site:  L5-S1  Needle: 3.5 in., 20 ga. Tuohy  Needle Placement: Paramedian epidural  Findings:   -Comments: Excellent flow of contrast into the epidural space.  Procedure Details: Using a paramedian approach from the side mentioned above, the region overlying the inferior lamina was localized under fluoroscopic visualization and the soft tissues overlying this structure were infiltrated with 4 ml. of 1% Lidocaine without Epinephrine. The Tuohy needle was inserted into the epidural space using a paramedian approach.   The epidural space was localized using loss of resistance along with counter oblique bi-planar fluoroscopic views.  After negative aspirate for air, blood, and CSF, a 2 ml. volume of Isovue-250 was injected into the epidural space and the flow of contrast was observed. Radiographs were obtained for documentation purposes.    The injectate was administered into the level noted above.   Additional Comments:  The patient tolerated the procedure well Dressing: 2 x 2 sterile gauze and Band-Aid    Post-procedure details: Patient was observed during the procedure. Post-procedure instructions were reviewed.  Patient left the clinic in stable condition.     Clinical History: MRI PELVIS WITHOUT CONTRAST  TECHNIQUE: Multiplanar multisequence MR imaging of the pelvis was performed. No intravenous contrast was administered.  COMPARISON:  Hip radiographs from 12/03/2019  FINDINGS: Bones/Joints:Red marrow redistribution is present. This is nonspecific can be associated with  a variety of conditions including chronic anemia, chronic heart failure, myelofibrosis, infiltrative marrow disease, response to infection, and other conditions. Mildly confluent  subcortical marrow edema in the right sacral ala adjacent to the SI joint, without a well-defined stress fracture. No joint effusion.  Bilateral facet arthropathy at L5-S1. In conjunction with disc bulge and left lateral recess and foraminal annular tear, there is bilateral foraminal impingement at the L5-S1 level.  No findings of avascular necrosis.  Moderate degenerative chondral thinning in both hips. Mild to moderate associated spurring of the femoral heads and acetabula.  No hip joint effusion.  Bursae: No regional bursitis.  Muscles and tendons  Unremarkable  Other findings  Sigmoid colon diverticulosis.  Hysterectomy.  IMPRESSION: 1. Moderate degenerative chondral thinning in both hips. 2. Bilateral foraminal impingement at L5-S1. 3. Mildly confluent subcortical marrow edema in the right sacral ala adjacent to the SI joint, without a well-defined stress fracture. This is likely due to mild unilateral sacroiliitis. 4. Red marrow redistribution is present. This is nonspecific can be associated with a variety of conditions including chronic anemia, chronic heart failure, myelofibrosis, infiltrative marrow disease, response to infection, and other conditions. 5. Sigmoid colon diverticulosis.   Electronically Signed   By: Van Clines M.D.   On: 02/10/2020 08:14     Objective:  VS:  HT:    WT:   BMI:     BP:125/88  HR:78bpm  TEMP: ( )  RESP:  Physical Exam Vitals and nursing note reviewed.  Constitutional:      General: She is not in acute distress.    Appearance: Normal appearance. She is not ill-appearing.  HENT:     Head: Normocephalic and atraumatic.     Right Ear: External ear normal.     Left Ear: External ear normal.  Eyes:     Extraocular Movements: Extraocular movements intact.  Cardiovascular:     Rate and Rhythm: Normal rate.     Pulses: Normal pulses.  Pulmonary:     Effort: Pulmonary effort is normal. No respiratory  distress.  Abdominal:     General: There is no distension.     Palpations: Abdomen is soft.  Musculoskeletal:        General: Tenderness present.     Cervical back: Neck supple.     Right lower leg: No edema.     Left lower leg: No edema.     Comments: Patient has good distal strength with no pain over the greater trochanters.  No clonus or focal weakness.  Skin:    Findings: No erythema, lesion or rash.  Neurological:     General: No focal deficit present.     Mental Status: She is alert and oriented to person, place, and time.     Sensory: No sensory deficit.     Motor: No weakness or abnormal muscle tone.     Coordination: Coordination normal.  Psychiatric:        Mood and Affect: Mood normal.        Behavior: Behavior normal.      Imaging: No results found.

## 2020-06-06 NOTE — Procedures (Signed)
Lumbar Epidural Steroid Injection - Interlaminar Approach with Fluoroscopic Guidance  Patient: [name]      Date of Birth: 24-Jul-1965 MRN: [id number] PCP: Ladell Pier, MD      Visit Date: 03/31/2020   Universal Protocol:     Consent Given By: the patient  Position: PRONE  Additional Comments: Vital signs were monitored before and after the procedure. Patient was prepped and draped in the usual sterile fashion. The correct patient, procedure, and site was verified.   Injection Procedure Details:   Procedure diagnoses: Lumbar radiculopathy [M54.16]   Meds Administered:  Meds ordered this encounter  Medications  . betamethasone acetate-betamethasone sodium phosphate (CELESTONE) injection 12 mg     Laterality: Right  Location/Site:  L5-S1  Needle: 3.5 in., 20 ga. Tuohy  Needle Placement: Paramedian epidural  Findings:   -Comments: Excellent flow of contrast into the epidural space.  Procedure Details: Using a paramedian approach from the side mentioned above, the region overlying the inferior lamina was localized under fluoroscopic visualization and the soft tissues overlying this structure were infiltrated with 4 ml. of 1% Lidocaine without Epinephrine. The Tuohy needle was inserted into the epidural space using a paramedian approach.   The epidural space was localized using loss of resistance along with counter oblique bi-planar fluoroscopic views.  After negative aspirate for air, blood, and CSF, a 2 ml. volume of Isovue-250 was injected into the epidural space and the flow of contrast was observed. Radiographs were obtained for documentation purposes.    The injectate was administered into the level noted above.   Additional Comments:  The patient tolerated the procedure well Dressing: 2 x 2 sterile gauze and Band-Aid    Post-procedure details: Patient was observed during the procedure. Post-procedure instructions were reviewed.  Patient left  the clinic in stable condition.

## 2020-06-09 ENCOUNTER — Encounter: Payer: Self-pay | Admitting: Physical Therapy

## 2020-06-09 ENCOUNTER — Other Ambulatory Visit: Payer: Self-pay

## 2020-06-09 ENCOUNTER — Ambulatory Visit: Payer: 59 | Admitting: Physical Therapy

## 2020-06-09 DIAGNOSIS — M5442 Lumbago with sciatica, left side: Secondary | ICD-10-CM

## 2020-06-09 DIAGNOSIS — M25552 Pain in left hip: Secondary | ICD-10-CM | POA: Diagnosis not present

## 2020-06-09 DIAGNOSIS — G8929 Other chronic pain: Secondary | ICD-10-CM

## 2020-06-09 DIAGNOSIS — M25551 Pain in right hip: Secondary | ICD-10-CM

## 2020-06-09 DIAGNOSIS — M6281 Muscle weakness (generalized): Secondary | ICD-10-CM

## 2020-06-09 NOTE — Therapy (Signed)
[place]. [place], [place], [address] Phone: [phone]   Fax:  [phone]  Physical Therapy Treatment  Patient Details  Name: [name] MRN: [id number] Date of Birth: [birth year] Referring Provider (PT): Hilts   Encounter Date: 06/09/2020   PT End of Session - 06/09/20 1626    Visit Number 5    Date for PT Re-Evaluation 07/09/20    PT Start Time 3710    PT Stop Time 6269    PT Time Calculation (min) 44 min    Activity Tolerance Patient tolerated treatment well;Patient limited by pain    Behavior During Therapy [facility] for tasks assessed/performed           Past Medical History:  Diagnosis Date  . Anxiety DX 2011  . Depression DX 1990  . 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 Grave's disease    Graves Disease  . History of radioactive iodine thyroid ablation    02/21/2019  . Menometrorrhagia   . Migraines     Past Surgical History:  Procedure Laterality Date  . ABDOMINAL HYSTERECTOMY    . TUBAL LIGATION      There were no vitals filed for this visit.   Subjective Assessment - 06/09/20 1530    Subjective Pt reports pain in hip is worse today    Currently in Pain? Yes    Pain Score 6     Pain Location Groin    Pain Score 6    Pain Location Hip    Pain Orientation Right;Left                             [facility] Adult PT Treatment/Exercise - 06/09/20 0001      Lumbar Exercises: Stretches   Active Hamstring Stretch Right;Left;1 rep;20 seconds    Piriformis Stretch Right;Left;2 reps;20 seconds    Piriformis Stretch Limitations seated    Gastroc Stretch Right;Left;1 rep;20 seconds      Lumbar Exercises: Aerobic   Nustep L4 x 6 min      Lumbar Exercises: Machines for Strengthening   Other Lumbar Machine Exercise Row & Lats 25lb 2x10    Other Lumbar Machine Exercise shoulder ext 5# 2x10      Lumbar Exercises: Standing   Heel Raises 15 reps      Knee/Hip Exercises:  Machines for Strengthening   Cybex Knee Extension 5lb 2x10    Cybex Knee Flexion 20lb2x10      Modalities   Modalities Electrical Stimulation;Moist Heat      Moist Heat Therapy   Number Minutes Moist Heat 12 Minutes    Moist Heat Location Lumbar Spine      Electrical Stimulation   Electrical Stimulation Location Lumbar    Electrical Stimulation Action IFC    Electrical Stimulation Parameters supine    Electrical Stimulation Goals Pain                    PT Short Term Goals - 05/12/20 1644      PT SHORT TERM GOAL #1   Title Pt will be I with initial HEP    Status Achieved             PT Long Term Goals - 06/02/20 1633      PT LONG TERM GOAL #2   Title Pt will demo SLR to 90 deg B    Status Partially Met   81R 90L  Plan - 06/09/20 1626    Clinical Impression Statement Pt presents to clinic reporting increased pain this rx. Able to tolerate machine interventions with no increase in pain. Cuing for form with stretches, demos tightness in hip IR/ER. Reports relief with heat and estim.    PT Treatment/Interventions ADLs/Self Care Home Management;Electrical Stimulation;Iontophoresis 4mg/ml Dexamethasone;Moist Heat;Stair training;Therapeutic activities;Therapeutic exercise;Neuromuscular re-education;Manual techniques;Patient/family education;Dry needling    PT Next Visit Plan gym activities, LE flexibility, manual/modalities as indicated, stair training    Consulted and Agree with Plan of Care Patient           Patient will benefit from skilled therapeutic intervention in order to improve the following deficits and impairments:  Abnormal gait,Decreased range of motion,Difficulty walking,Decreased endurance,Increased muscle spasms,Decreased activity tolerance,Pain,Impaired flexibility,Decreased strength,Postural dysfunction  Visit Diagnosis: Pain in left hip  Pain in right hip  Muscle weakness (generalized)  Chronic bilateral low back  pain with bilateral sciatica     Problem List Patient Active Problem List   Diagnosis Date Noted  . 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 Graves disease 04/10/2019  . Prediabetes 10/18/2018  . Tinea pedis 07/31/2014  . Screening for HIV (human immunodeficiency virus) 07/31/2014  . Cramp of limb 01/13/2011  . Sarcoidosis of lung (HCC) 12/30/2010  . Rhinitis, allergic 11/29/2010  . Urticaria, idiopathic 10/27/2010  . COPD, mild (HCC) 08/06/2010  . Graves' disease 06/03/2010  . MIGRAINE HEADACHE 11/12/2008  . ROTATOR CUFF TEAR 11/12/2008  . DEPRESSION, RECURRENT 05/12/2008  . ANXIETY DISORDER, GENERALIZED 02/05/2007  . TOBACCO ABUSE 02/05/2007   Amanda Sugg, PT, DPT Amanda M Sugg 06/09/2020, 4:28 PM  [facility] [address] [place], [place], [address] Phone: [phone]   Fax:  [phone]  Name: [name] MRN: [id number] Date of Birth: 01/01/1966   

## 2020-06-16 ENCOUNTER — Encounter: Payer: Self-pay | Admitting: Physical Therapy

## 2020-06-16 ENCOUNTER — Other Ambulatory Visit: Payer: Self-pay

## 2020-06-16 ENCOUNTER — Ambulatory Visit: Payer: 59 | Admitting: Physical Therapy

## 2020-06-16 DIAGNOSIS — M6281 Muscle weakness (generalized): Secondary | ICD-10-CM

## 2020-06-16 DIAGNOSIS — M25552 Pain in left hip: Secondary | ICD-10-CM | POA: Diagnosis not present

## 2020-06-16 DIAGNOSIS — M25551 Pain in right hip: Secondary | ICD-10-CM

## 2020-06-16 NOTE — Therapy (Signed)
[place]. [place], [place], [address] Phone: [phone]   Fax:  [phone]  Physical Therapy Treatment  Patient Details  Name: KYNDRA CONDRON MRN: [id number] Date of Birth: 01/16/1966 Referring Provider (PT): Hilts   Encounter Date: 06/16/2020   PT End of Session - 06/16/20 1644    Visit Number 6    Date for PT Re-Evaluation 07/09/20    PT Start Time 1600    PT Stop Time 1644    PT Time Calculation (min) 44 min    Activity Tolerance Patient tolerated treatment well    Behavior During Therapy [facility] for tasks assessed/performed           Past Medical History:  Diagnosis Date  . Anxiety DX 2011  . Depression DX 1990  . 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 Grave's disease    Graves Disease  . History of radioactive iodine thyroid ablation    02/21/2019  . Menometrorrhagia   . Migraines     Past Surgical History:  Procedure Laterality Date  . ABDOMINAL HYSTERECTOMY    . TUBAL LIGATION      There were no vitals filed for this visit.   Subjective Assessment - 06/16/20 1600    Subjective Pt reports low back and RLE pain. Leg issues is not that bad.    Currently in Pain? Yes    Pain Score 6     Pain Location --   R Leg, low back                            [facility] Adult PT Treatment/Exercise - 06/16/20 0001      Lumbar Exercises: Aerobic   UBE (Upper Arm Bike) L1 x3 min    Recumbent Bike L1 x 3 min    Nustep L4 x 5 min      Lumbar Exercises: Machines for Strengthening   Leg Press 20lb x10; 40lb x10    Other Lumbar Machine Exercise Row & Lats 25lb 2x10      Lumbar Exercises: Supine   Other Supine Lumbar Exercises LE on pball bridges , K2C                    PT Short Term Goals - 05/12/20 1644      PT SHORT TERM GOAL #1   Title Pt will be I with initial HEP    Status Achieved             PT Long Term Goals - 06/02/20 1633      PT LONG TERM GOAL #2    Title Pt will demo SLR to 90 deg B    Status Partially Met   81R 90L                Plan - 06/16/20 1644    Clinical Impression Statement Pt enters clinic stated she did not like E-stim and heat. She report some low back and RLE pain. Despite reports she did well with today's treatment. Added leg press interventions without issue. Tactile cue for posture needed with seated rows and standing shoulder extensions.    Personal Factors and Comorbidities Comorbidity 2    Comorbidities graves disease, HTN    Examination-Activity Limitations Bend;Carry;Locomotion Level;Stairs    Examination-Participation Restrictions Community Activity;Interpersonal Relationship;Occupation    Stability/Clinical Decision Making Stable/Uncomplicated    Rehab Potential Good    PT Frequency 2x /  week    PT Duration 6 weeks    PT Treatment/Interventions ADLs/Self Care Home Management;Electrical Stimulation;Iontophoresis 4mg /ml Dexamethasone;Moist Heat;Stair training;Therapeutic activities;Therapeutic exercise;Neuromuscular re-education;Manual techniques;Patient/family education;Dry needling    PT Next Visit Plan gym activities, LE flexibility, manual/modalities as indicated, stair training           Patient will benefit from skilled therapeutic intervention in order to improve the following deficits and impairments:  Abnormal gait,Decreased range of motion,Difficulty walking,Decreased endurance,Increased muscle spasms,Decreased activity tolerance,Pain,Impaired flexibility,Decreased strength,Postural dysfunction  Visit Diagnosis: Muscle weakness (generalized)  Pain in right hip     Problem List Patient Active Problem List   Diagnosis Date Noted  . 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 Graves disease 04/10/2019  . Prediabetes 10/18/2018  . Tinea pedis 07/31/2014  . Screening for HIV (human immunodeficiency virus) 07/31/2014  . Cramp of limb 01/13/2011  . Sarcoidosis of lung ([place]) 12/30/2010  .  Rhinitis, allergic 11/29/2010  . Urticaria, idiopathic 10/27/2010  . COPD, mild ([place]) 08/06/2010  . Graves' disease 06/03/2010  . MIGRAINE HEADACHE 11/12/2008  . ROTATOR CUFF TEAR 11/12/2008  . DEPRESSION, RECURRENT 05/12/2008  . ANXIETY DISORDER, GENERALIZED 02/05/2007  . TOBACCO ABUSE 02/05/2007    Scot Jun 06/16/2020, 4:46 PM  [place]. [place], [place], [address] Phone: [phone]   Fax:  [phone]  Name: MATISYN CABEZA MRN: [id number] Date of Birth: 1965-09-01

## 2020-06-23 ENCOUNTER — Ambulatory Visit: Payer: 59 | Admitting: Physical Therapy

## 2020-06-23 ENCOUNTER — Encounter: Payer: Self-pay | Admitting: Physical Therapy

## 2020-06-23 ENCOUNTER — Other Ambulatory Visit: Payer: Self-pay

## 2020-06-23 DIAGNOSIS — M5442 Lumbago with sciatica, left side: Secondary | ICD-10-CM

## 2020-06-23 DIAGNOSIS — M25552 Pain in left hip: Secondary | ICD-10-CM | POA: Diagnosis not present

## 2020-06-23 DIAGNOSIS — M25551 Pain in right hip: Secondary | ICD-10-CM

## 2020-06-23 DIAGNOSIS — M6281 Muscle weakness (generalized): Secondary | ICD-10-CM

## 2020-06-23 DIAGNOSIS — G8929 Other chronic pain: Secondary | ICD-10-CM

## 2020-06-23 NOTE — Therapy (Signed)
[place]. [place], [place], [address] Phone: [phone]   Fax:  [phone]  Physical Therapy Treatment  Patient Details  Name: [name] MRN: [id number] Date of Birth: 1965-10-13 Referring Provider (PT): Hilts   Encounter Date: 06/23/2020   PT End of Session - 06/23/20 1643    Visit Number 7    Date for PT Re-Evaluation 07/09/20    PT Start Time 1600    PT Stop Time 1643    PT Time Calculation (min) 43 min    Activity Tolerance Patient tolerated treatment well    Behavior During Therapy [facility] for tasks assessed/performed           Past Medical History:  Diagnosis Date  . Anxiety DX 2011  . Depression DX 1990  . 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 Grave's disease    Graves Disease  . History of radioactive iodine thyroid ablation    02/21/2019  . Menometrorrhagia   . Migraines     Past Surgical History:  Procedure Laterality Date  . ABDOMINAL HYSTERECTOMY    . TUBAL LIGATION      There were no vitals filed for this visit.   Subjective Assessment - 06/23/20 1604    Subjective Doing ok R hip has been hurting for the last 4 days, think it is due to working 7 days straight    Currently in Pain? Yes    Pain Score 7     Pain Location Hip    Pain Orientation Right                             [facility] Adult PT Treatment/Exercise - 06/23/20 0001      Lumbar Exercises: Aerobic   UBE (Upper Arm Bike) L1 x 2 min each    Recumbent Bike stopped after 1 min due to R hip pain    Nustep L4 x 5 min      Lumbar Exercises: Machines for Strengthening   Other Lumbar Machine Exercise Row & Lats 25lb 2x10    Other Lumbar Machine Exercise shoulder ext 10# 2x10      Lumbar Exercises: Supine   Bridge 2 seconds;10 reps    Other Supine Lumbar Exercises clan shells blue x15      Knee/Hip Exercises: Stretches   Passive Hamstring Stretch Right;10 seconds;5 reps    ITB Stretch Right;3  reps;10 seconds    Piriformis Stretch Right;4 reps;10 seconds      Knee/Hip Exercises: Seated   Sit to Sand 2 sets;10 reps;without UE support   holding yellow ball                   PT Short Term Goals - 05/12/20 1644      PT SHORT TERM GOAL #1   Title Pt will be I with initial HEP    Status Achieved             PT Long Term Goals - 06/02/20 1633      PT LONG TERM GOAL #2   Title Pt will demo SLR to 90 deg B    Status Partially Met   81R 90L                Plan - 06/23/20 1643    Clinical Impression Statement Increase R him pain limited some of today's activities. Pt reports no MOI other than working 7 days in  a row. Avoided leg press and elected sit to stands were pt did report some R knee pain during the first set. Cues to prevent forward rounded shoulder with shoulder extensions. R hip was very tight with passive stretching, pain reported with ITB stretching and IR PROM.    Personal Factors and Comorbidities Comorbidity 2    Comorbidities graves disease, HTN    Examination-Activity Limitations Bend;Carry;Locomotion Level;Stairs    Examination-Participation Restrictions Community Activity;Interpersonal Relationship;Occupation    Stability/Clinical Decision Making Stable/Uncomplicated    Rehab Potential Good    PT Frequency 2x / week    PT Duration 6 weeks    PT Treatment/Interventions ADLs/Self Care Home Management;Electrical Stimulation;Iontophoresis 51m/ml Dexamethasone;Moist Heat;Stair training;Therapeutic activities;Therapeutic exercise;Neuromuscular re-education;Manual techniques;Patient/family education;Dry needling    PT Next Visit Plan gym activities, LE flexibility, manual/modalities as indicated, stair training           Patient will benefit from skilled therapeutic intervention in order to improve the following deficits and impairments:  Abnormal gait,Decreased range of motion,Difficulty walking,Decreased endurance,Increased muscle  spasms,Decreased activity tolerance,Pain,Impaired flexibility,Decreased strength,Postural dysfunction  Visit Diagnosis: Pain in right hip  Muscle weakness (generalized)  Pain in left hip  Chronic bilateral low back pain with bilateral sciatica     Problem List Patient Active Problem List   Diagnosis Date Noted  . 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 Graves disease 04/10/2019  . Prediabetes 10/18/2018  . Tinea pedis 07/31/2014  . Screening for HIV (human immunodeficiency virus) 07/31/2014  . Cramp of limb 01/13/2011  . [place] ([place] 12/30/2010  . Rhinitis, allergic 11/29/2010  . Urticaria, idiopathic 10/27/2010  . COPD, mild ([place] 08/06/2010  . Graves' disease 06/03/2010  . MIGRAINE HEADACHE 11/12/2008  . ROTATOR CUFF TEAR 11/12/2008  . DEPRESSION, RECURRENT 05/12/2008  . ANXIETY DISORDER, GENERALIZED 02/05/2007  . TOBACCO ABUSE 02/05/2007    RScot Jun2/23/2022, 4:51 PM  [place] [address]Phone: [phone]  Fax:  [phone] Name: Lori BERKEMEIERMRN: [id number] of Birth: [birth year]

## 2020-06-30 ENCOUNTER — Ambulatory Visit: Payer: 59 | Admitting: Physical Therapy

## 2020-06-30 NOTE — Progress Notes (Signed)
Name: [name]  MRN/ DOB: [id number], [birth year]    Age/ Sex: 55 y.o., female     PCP: Ladell Pier, MD   Reason for Endocrinology Evaluation: [name]' Disease     Initial Endocrinology Clinic Visit: 01/09/2019    PATIENT IDENTIFIER: [name] is a 55 y.o., female with a past medical history of granulomatous lung disease, headaches, graves' disease and anxiety  . She has followed with [facility] since 01/09/2019  for consultative assistance with management of her graves' disease  HISTORICAL SUMMARY: The patient was first diagnosed with Graves' disease ~2012  .Over the years she has been prescribed  methimazole , but would take it on and off due to non-compliance issues , her thyroid condition has never been optimally controlled.   On her initial visit to our clinic she was not taking methimazole for ~ 3 months prior to her presentation.   She is S/P RAI ablation on 02/21/2019 with 13.8 mCi I-131 sodium iodide LT- 4 replacement started 04/2019 due to low FT4.   No FH of thyroid disease  Sister with lupus   SUBJECTIVE:     Today (07/01/2020):  [name] is here for a follow up on hyperthyroidism . She is S/P RAI ablation in 01/2019  Weight has been stable  She has noted dysphagia ~3 weeks ago, mainly towards solids. Denies heartburn or abdominal pain.  She has not had a colonoscopy in the past 10 yrs   Constipation has resolved  Has been stressed due to 57 yr old son with nephrotic syndrome  Has burning and itching in the eyes, feels like sand  Has allergies of the eyes , denies double vision     Levothyroxine 88 mcg , Half a tablet on Sundays, 1 tablet the rest of the week    HISTORY:  Past Medical History:  Past Medical History:  Diagnosis Date  . Anxiety DX 2011  . Depression DX 1990  . 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 Grave's disease    Graves Disease  . History of radioactive iodine thyroid ablation    02/21/2019  .  Menometrorrhagia   . Migraines    Past Surgical History:  Past Surgical History:  Procedure Laterality Date  . ABDOMINAL HYSTERECTOMY    . TUBAL LIGATION     Social History:  reports that she has been smoking cigarettes. She has a 16.00 pack-year smoking history. She has never used smokeless tobacco. She reports current alcohol use. She reports that she does not use drugs. Family History:  Family History  Problem Relation Age of Onset  . Hypertension Mother   . Heart attack Mother   . Hypertension Brother   . Hypertension Father   . Allergies Father   . Allergies Other        children  . Breast cancer Maternal Aunt   . Breast cancer Maternal Aunt      HOME MEDICATIONS: Allergies as of 07/01/2020   No Known Allergies     Medication List       Accurate as of July 01, 2020  8:54 AM. If you have any questions, ask your nurse or doctor.        aspirin EC 81 MG tablet Take 1 tablet (81 mg total) by mouth daily.   baclofen 20 MG tablet Commonly known as: LIORESAL Take 1 tablet (20 mg total) by mouth 3 (three) times daily as needed for muscle spasms.   BC HEADACHE PO  Take 1 packet by mouth daily as needed (pain).   gabapentin 100 MG capsule Commonly known as: Neurontin Take 3 capsules (300 mg total) by mouth at bedtime. Take as needed for hip pain.   ibuprofen 800 MG tablet Commonly known as: ADVIL Take 1 tablet (800 mg total) by mouth every 8 (eight) hours as needed.   levothyroxine 88 MCG tablet Commonly known as: SYNTHROID TAKE HALF TABLET BY MOUTH ON SUNDAY, TAKE 1 TABLET DAILY THE REST OF THE WEEK   meloxicam 7.5 MG tablet Commonly known as: MOBIC Take 1 tablet (7.5 mg total) by mouth daily as needed for pain (hip and back pain).   nicotine 14 mg/24hr patch Commonly known as: NICODERM CQ - dosed in mg/24 hours Place 1 patch (14 mg total) onto the skin daily.   traMADol 50 MG tablet Commonly known as: ULTRAM Take 1 tablet (50 mg total) by mouth every 6  (six) hours as needed.         OBJECTIVE:   PHYSICAL EXAM: VS: BP 130/82   Pulse 88   Ht 5' 7.5" (1.715 m)   Wt 151 lb 2 oz (68.5 kg)   LMP 04/23/2011   SpO2 98%   BMI 23.32 kg/m    EXAM: General: Pt appears well and is in NAD  Eyes:  External eye exam shows mild periorbital edema,without stare, lid lag or exophthalmos.  EOM intact.   Neck: General: Supple without adenopathy. Thyroid: Thyroid size normal.  No goiter  appreciated. No thyroid bruit.  Lungs: Clear with good BS bilat with no rales, rhonchi, or wheezes  Heart: Auscultation: RRR.  Abdomen: Normoactive bowel sounds, soft, nontender, without masses or organomegaly palpable  Extremities:  BL LE: No pretibial edema normal ROM and strength.  Mental Status: Judgment, insight: Intact Orientation: Oriented to time, place, and person Mood and affect: No depression, anxiety, or agitation     DATA REVIEWED:  Results for [name] (MRN [id number]) as of 07/01/2020 12:37  Ref. Range 07/01/2020 09:06  TSH Latest Ref Range: 0.35 - 4.50 uIU/mL 0.93     ASSESSMENT / PLAN / RECOMMENDATIONS:   Postablative Hypothyroidism  - She is S/P RAI Ablation on 02/21/2019 with 13.8 mCi I-131 sodium iodide - She is clinically euthyroid  - TSH normal   Medications   Continue  Levothyroxine 88 mcg , Half a tablet on Sundays, 1 tablet the rest of the week   2. Graves' Disease:   - Pt with " feels sand in her eyes" . Given her hx of Graves' disease, will refer to ophthalmology    3. Dysphagia :  - This is new , mainly to solids.I will refer her to GI. She is also past due for colonoscopy      F/U in 6 months Labs in 8 weeks      Signed electronically by: Mack Guise, MD  [facility]  [facility] [address] [place], [place] [address] Phone: [phone] FAX: [phone]      CC: Ladell Pier, MD [place] [address] Phone:  [phone]  Fax: [phone]   Return to Endocrinology clinic as below: Future Appointments  Date Time Provider [place]  07/14/2020  4:00 PM Johney Frame, Jolaine Artist, PTA [facility]

## 2020-07-01 ENCOUNTER — Other Ambulatory Visit: Payer: Self-pay

## 2020-07-01 ENCOUNTER — Encounter: Payer: Self-pay | Admitting: Internal Medicine

## 2020-07-01 ENCOUNTER — Ambulatory Visit (INDEPENDENT_AMBULATORY_CARE_PROVIDER_SITE_OTHER): Payer: 59 | Admitting: Internal Medicine

## 2020-07-01 VITALS — BP 130/82 | HR 88 | Ht 67.5 in | Wt 151.1 lb

## 2020-07-01 DIAGNOSIS — E0500 Thyrotoxicosis with diffuse goiter without thyrotoxic crisis or storm: Secondary | ICD-10-CM

## 2020-07-01 DIAGNOSIS — E890 Postprocedural hypothyroidism: Secondary | ICD-10-CM

## 2020-07-01 DIAGNOSIS — R1310 Dysphagia, unspecified: Secondary | ICD-10-CM | POA: Insufficient documentation

## 2020-07-01 LAB — TSH: TSH: 0.93 u[IU]/mL (ref 0.35–4.50)

## 2020-07-01 MED ORDER — LEVOTHYROXINE SODIUM 88 MCG PO TABS: 88.0000 ug | ORAL_TABLET | ORAL | 3 refills | Status: DC

## 2020-07-01 NOTE — Patient Instructions (Signed)
-   Continue Levothyroxine 88 mcg , HALF a tablet on Sundays and 1 tablet the rest of the week, until your results come back

## 2020-07-14 ENCOUNTER — Ambulatory Visit: Payer: 59 | Admitting: Physical Therapy

## 2020-07-19 ENCOUNTER — Encounter: Payer: Self-pay | Admitting: Nurse Practitioner

## 2020-07-20 ENCOUNTER — Other Ambulatory Visit: Payer: Self-pay | Admitting: Internal Medicine

## 2020-08-03 ENCOUNTER — Ambulatory Visit: Payer: 59 | Admitting: Nurse Practitioner

## 2020-08-19 ENCOUNTER — Ambulatory Visit (INDEPENDENT_AMBULATORY_CARE_PROVIDER_SITE_OTHER): Payer: 59 | Admitting: Nurse Practitioner

## 2020-08-19 ENCOUNTER — Other Ambulatory Visit (INDEPENDENT_AMBULATORY_CARE_PROVIDER_SITE_OTHER): Payer: 59

## 2020-08-19 ENCOUNTER — Encounter: Payer: Self-pay | Admitting: Nurse Practitioner

## 2020-08-19 VITALS — BP 122/77 | HR 85 | Ht 67.5 in | Wt 153.2 lb

## 2020-08-19 DIAGNOSIS — R1310 Dysphagia, unspecified: Secondary | ICD-10-CM | POA: Diagnosis not present

## 2020-08-19 DIAGNOSIS — D509 Iron deficiency anemia, unspecified: Secondary | ICD-10-CM

## 2020-08-19 DIAGNOSIS — Z1211 Encounter for screening for malignant neoplasm of colon: Secondary | ICD-10-CM

## 2020-08-19 LAB — CBC WITH DIFFERENTIAL/PLATELET
Basophils Absolute: 0 10*3/uL (ref 0.0–0.1)
Basophils Relative: 0.6 % (ref 0.0–3.0)
Eosinophils Absolute: 0.2 10*3/uL (ref 0.0–0.7)
Eosinophils Relative: 3.3 % (ref 0.0–5.0)
HCT: 36.6 % (ref 36.0–46.0)
Hemoglobin: 12 g/dL (ref 12.0–15.0)
Lymphocytes Relative: 25.8 % (ref 12.0–46.0)
Lymphs Abs: 1.4 10*3/uL (ref 0.7–4.0)
MCHC: 32.6 g/dL (ref 30.0–36.0)
MCV: 88 fl (ref 78.0–100.0)
Monocytes Absolute: 0.5 10*3/uL (ref 0.1–1.0)
Monocytes Relative: 8.6 % (ref 3.0–12.0)
Neutro Abs: 3.3 10*3/uL (ref 1.4–7.7)
Neutrophils Relative %: 61.7 % (ref 43.0–77.0)
Platelets: 328 10*3/uL (ref 150.0–400.0)
RBC: 4.16 Mil/uL (ref 3.87–5.11)
RDW: 13.7 % (ref 11.5–15.5)
WBC: 5.3 10*3/uL (ref 4.0–10.5)

## 2020-08-19 LAB — COMPREHENSIVE METABOLIC PANEL
ALT: 9 U/L (ref 0–35)
AST: 11 U/L (ref 0–37)
Albumin: 3.9 g/dL (ref 3.5–5.2)
Alkaline Phosphatase: 68 U/L (ref 39–117)
BUN: 18 mg/dL (ref 6–23)
CO2: 27 mEq/L (ref 19–32)
Calcium: 9.5 mg/dL (ref 8.4–10.5)
Chloride: 105 mEq/L (ref 96–112)
Creatinine, Ser: 0.9 mg/dL (ref 0.40–1.20)
GFR: 71.21 mL/min (ref >60.00)
Glucose, Bld: 87 mg/dL (ref 70–99)
Potassium: 3.9 mEq/L (ref 3.5–5.1)
Sodium: 139 mEq/L (ref 135–145)
Total Bilirubin: 0.3 mg/dL (ref 0.2–1.2)
Total Protein: 7.2 g/dL (ref 6.0–8.3)

## 2020-08-19 LAB — FERRITIN: Ferritin: 29.4 ng/mL (ref 10.0–291.0)

## 2020-08-19 LAB — IRON: Iron: 83 ug/dL (ref 42–145)

## 2020-08-19 MED ORDER — FAMOTIDINE 20 MG PO TABS: 20.0000 mg | ORAL_TABLET | Freq: Every day | ORAL | 3 refills | Status: DC

## 2020-08-19 NOTE — Progress Notes (Signed)
08/19/2020 RENEZMAE CANLAS [id number] 10/20/1965   CHIEF COMPLAINT: Dysphagia   HISTORY OF PRESENT ILLNESS: Horald Chestnut. Petrak is a 55 year old female with a past medical history of anxiety, depression, migraine headaches, Graves' disease s/p radioactive iodine therapy and iron deficiency. Sarcoidosis/granulomatous lung disease is documented on her [place] list, however, she denies having this diagnosis. She presents to our office today as referred by Dr. Karle Plumber for further evaluation regarding dysphagia.  She describes having food which gets stuck to her upper throat/upper esophageal area daily for the past several months.  She drinks water and the stuck food passes down her esophagus.  She complains of increased belching without experiencing heartburn.  No upper or lower abdominal pain.  She states she "takes ibuprofen like candy" (800mg  bid) x 2 months due to having bilateral hip pain which is possibly associated to back issues.  She is taking Ibuprofen 800 mg twice daily for the past 2 months.  No odynophagia.  She is passing normal formed brown bowel movement daily.  No rectal bleeding or black stools.  She denies ever having a screening colonoscopy. No known family history of esophageal, gastric or colon cancer.  No other complaints at this time.  Laboratory studies 02/25/2020: Iron 39.  Iron saturation 11%.  Ferritin 27.  Hemoglobin 11.9.  Hematocrit 36.3.  MCV 87.3.  Vitamin B12 level 584.  Past Medical History:  Diagnosis Date  . Anxiety DX 2011  . Depression DX 1990  . 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 Grave's disease    Graves Disease  . History of radioactive iodine thyroid ablation    02/21/2019  . Menometrorrhagia   . Migraines    Past Surgical History:  Procedure Laterality Date  . ABDOMINAL HYSTERECTOMY    . TUBAL LIGATION     Social History: She is separated.  She has 1 son and 2 daughters.  She smokes cigarettes for at least 16 years.  No alcohol use.  No  drug use.  Family History: Mother deceased age 53 MI. Father deceased age 76 pneumonia. Maternal aunt breast cancer. Sister has Lupus.  Grandson with CKD on Cyclosporin.   No Known Allergies    Outpatient Encounter Medications as of 08/19/2020  Medication Sig  . aspirin EC 81 MG tablet Take 1 tablet (81 mg total) by mouth daily.  . Aspirin-Salicylamide-Caffeine (BC HEADACHE PO) Take 1 packet by mouth daily as needed (pain).  . baclofen (LIORESAL) 20 MG tablet Take 1 tablet (20 mg total) by mouth 3 (three) times daily as needed for muscle spasms.  Marland Kitchen gabapentin (NEURONTIN) 100 MG capsule Take 3 capsules (300 mg total) by mouth at bedtime. Take as needed for hip pain.  Marland Kitchen ibuprofen (ADVIL) 800 MG tablet Take 1 tablet (800 mg total) by mouth every 8 (eight) hours as needed.  Marland Kitchen levothyroxine (SYNTHROID) 88 MCG tablet Take 1 tablet (88 mcg total) by mouth as directed. Half a tablet on sundays and 1 tablet the rest of the week  . meloxicam (MOBIC) 7.5 MG tablet Take 1 tablet (7.5 mg total) by mouth daily as needed for pain (hip and back pain).  . nicotine (NICODERM CQ - DOSED IN MG/24 HOURS) 14 mg/24hr patch Place 1 patch (14 mg total) onto the skin daily.  . traMADol (ULTRAM) 50 MG tablet Take 1 tablet (50 mg total) by mouth every 6 (six) hours as needed.   No facility-administered encounter medications on file as of 08/19/2020.  REVIEW OF SYSTEMS: All other systems reviewed and negative except where noted in the History of Present Illness.  PHYSICAL EXAM: BP 122/77 (BP Location: Left Arm, Patient Position: Sitting)   Pulse 85   Ht 5' 7.5" (1.715 m)   Wt 153 lb 3.2 oz (69.5 kg)   LMP 04/23/2011   SpO2 98%   BMI 23.64 kg/m  General: Well developed 55 year old female in no acute distress. Head: Normocephalic and atraumatic. Eyes:  Sclerae non-icteric, conjunctive pink. Ears: Normal auditory acuity. Mouth: Dentition intact. No ulcers or lesions.  Neck: Supple, no lymphadenopathy or  thyromegaly.  Lungs: Clear bilaterally to auscultation without wheezes, crackles or rhonchi. Heart: Regular rate and rhythm. No murmur, rub or gallop appreciated.  Abdomen: Soft, nontender, non distended. No masses. No hepatosplenomegaly. Normoactive bowel sounds x 4 quadrants.  Rectal: Deferred.  Musculoskeletal: Symmetrical with no gross deformities. Skin: Warm and dry. No rash or lesions on visible extremities. Extremities: No edema. Neurological: Alert oriented x 4, no focal deficits.  Psychological:  Alert and cooperative. Normal mood and affect.  ASSESSMENT AND PLAN:  60.  55 year old female with dysphagia. + NSAID use.  -EGD benefits and risks discussed including risk with sedation, risk of bleeding, perforation and infection  -Stop Ibuprofen -Famotidine 20 mg 1 p.o. daily -Gaviscon 1 tablespoon 3 times daily as needed -Further follow-up to be determined after EGD completed -Patient to call our office if symptoms worsen -CBC, CMP  2.  Colon cancer screen -Colonoscopy benefits and risks discussed including risk with sedation, risk of bleeding, perforation and infection   3. History of iron deficiency  -CBC, iron, iron saturation, Ferritin and TIBC -EGD and colonoscopy as ordered above             CC:  Ladell Pier, MD

## 2020-08-19 NOTE — Patient Instructions (Signed)
You have been scheduled for an endoscopy and colonoscopy. Please follow the written instructions given to you at your visit today. Please pick up your prep supplies at the pharmacy within the next 1-3 days. If you use inhalers (even only as needed), please bring them with you on the day of your procedure.   Your provider has requested that you go to the basement level for lab work before leaving today. Press "B" on the elevator. The lab is located at the first door on the left as you exit the elevator.  We have sent Famotidine to your pharmacy  Purchase Gaviscon OTC 1 tbsp three times a day as needed  Call office if symptoms worsen or persist   Due to recent changes in healthcare laws, you may see the results of your imaging and laboratory studies on MyChart before your provider has had a chance to review them.  We understand that in some cases there may be results that are confusing or concerning to you. Not all laboratory results come back in the same time frame and the provider may be waiting for multiple results in order to interpret others.  Please give [place] 48 hours in order for your provider to thoroughly review all the results before contacting the office for clarification of your results.   If you are age 64 or older, your body mass index should be between 23-30. Your Body mass index is 23.64 kg/m. If this is out of the aforementioned range listed, please consider follow up with your Primary Care Provider.  If you are age 76 or younger, your body mass index should be between 19-25. Your Body mass index is 23.64 kg/m. If this is out of the aformentioned range listed, please consider follow up with your Primary Care Provider.    I appreciate the  opportunity to care for you  Thank You   Marcella Dubs

## 2020-08-20 LAB — IRON AND TIBC
Iron Saturation: 23 % (ref 15–55)
Iron: 81 ug/dL (ref 27–159)
Total Iron Binding Capacity: 355 ug/dL (ref 250–450)
UIBC: 274 ug/dL (ref 131–425)

## 2020-08-20 NOTE — Progress Notes (Signed)
Appt added to cancellation list. 

## 2020-08-20 NOTE — Progress Notes (Signed)
Reviewed. Endoscopy scheduled in June 2022. Please place on a cancellation list in the event that an earlier appointment becomes available.   Jameka Ivie L. Tarri Glenn, MD, MPH

## 2020-08-26 ENCOUNTER — Other Ambulatory Visit (INDEPENDENT_AMBULATORY_CARE_PROVIDER_SITE_OTHER): Payer: 59

## 2020-08-26 ENCOUNTER — Other Ambulatory Visit: Payer: Self-pay

## 2020-08-26 DIAGNOSIS — E890 Postprocedural hypothyroidism: Secondary | ICD-10-CM

## 2020-08-26 LAB — TSH: TSH: 0.51 u[IU]/mL (ref 0.35–4.50)

## 2020-09-29 ENCOUNTER — Other Ambulatory Visit: Payer: Self-pay | Admitting: Ophthalmology

## 2020-09-29 DIAGNOSIS — H05243 Constant exophthalmos, bilateral: Secondary | ICD-10-CM

## 2020-10-13 ENCOUNTER — Ambulatory Visit
Admission: EM | Admit: 2020-10-13 | Discharge: 2020-10-13 | Disposition: A | Discharge status: Home / Self Care | Payer: 59 | Attending: Emergency Medicine | Admitting: Emergency Medicine

## 2020-10-13 ENCOUNTER — Ambulatory Visit (INDEPENDENT_AMBULATORY_CARE_PROVIDER_SITE_OTHER): Payer: 59

## 2020-10-13 ENCOUNTER — Encounter: Payer: Self-pay | Admitting: Emergency Medicine

## 2020-10-13 ENCOUNTER — Other Ambulatory Visit: Payer: Self-pay

## 2020-10-13 DIAGNOSIS — J069 Acute upper respiratory infection, unspecified: Secondary | ICD-10-CM

## 2020-10-13 DIAGNOSIS — R059 Cough, unspecified: Secondary | ICD-10-CM | POA: Diagnosis not present

## 2020-10-13 DIAGNOSIS — Z20822 Contact with and (suspected) exposure to covid-19: Secondary | ICD-10-CM

## 2020-10-13 MED ORDER — BENZONATATE 100 MG PO CAPS: 100.0000 mg | ORAL_CAPSULE | Freq: Three times a day (TID) | ORAL | 0 refills | Status: DC

## 2020-10-13 MED ORDER — PREDNISONE 20 MG PO TABS: 40.0000 mg | ORAL_TABLET | Freq: Every day | ORAL | 0 refills | Status: AC

## 2020-10-13 MED ORDER — DM-GUAIFENESIN ER 30-600 MG PO TB12: 1.0000 | ORAL_TABLET | Freq: Two times a day (BID) | ORAL | 0 refills | Status: DC

## 2020-10-13 MED ORDER — ACETAMINOPHEN 325 MG PO TABS
650.0000 mg | ORAL_TABLET | Freq: Once | ORAL | Status: AC
  Administered 2020-10-13: 650 mg via ORAL

## 2020-10-13 NOTE — ED Triage Notes (Signed)
Pt here for fever, cough, body aches x 3 days

## 2020-10-13 NOTE — Discharge Instructions (Addendum)
Chest x-ray normal, COVID/flu test pending Mucinex DM twice daily to help with congestion and cough Tessalon every 8 hours for cough Prednisone 40 mg daily for 5 days to help with inflammation in chest Tylenol and ibuprofen for fevers Please monitor breathing, follow-up if not improving or worsening

## 2020-10-13 NOTE — ED Provider Notes (Signed)
[facility]    CSN: [id number] Arrival date & time: 10/13/20  1722      History   Chief Complaint Chief Complaint  Patient presents with   Fever   Cough    HPI MARLENNE RIDGE is a 55 y.o. female history of sarcoidosis, tobacco use, Graves' disease, migraines, presenting today for evaluation of fever fatigue and cough.  Reports over the past 3 days she has had very low energy and generalized weakness.  Reports associated cough and congestion with slight shortness of breath.  She denies any chest pain.  Denies known COVID exposures.  Baseline O2 appears to be 98%.  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   Graves disease 04/10/2019   Prediabetes 10/18/2018   Tinea pedis 07/31/2014   Screening for HIV (human immunodeficiency virus) 07/31/2014   Cramp of limb 01/13/2011   Sarcoidosis of lung ([place]) 12/30/2010   Rhinitis, allergic 11/29/2010   Urticaria, 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      Home Medications    Prior to Admission medications   Medication Sig Start Date End Date Taking? Authorizing Provider  benzonatate (TESSALON) 100 MG capsule Take 1-2 capsules (100-200 mg total) by mouth every 8 (eight) hours. 10/13/20  Yes Mark Benecke C, PA-C   dextromethorphan-guaiFENesin (MUCINEX DM) 30-600 MG 12hr tablet Take 1 tablet by mouth 2 (two) times daily. 10/13/20  Yes Patina Spanier C, PA-C  predniSONE (DELTASONE) 20 MG tablet Take 2 tablets (40 mg total) by mouth daily with breakfast for 5 days. 10/13/20 10/18/20 Yes Murphy Bundick C, PA-C  aspirin EC 81 MG tablet Take 1 tablet (81 mg total) by mouth daily. 12/07/17   Ladell Pier, MD  Aspirin-Salicylamide-Caffeine (BC HEADACHE PO) Take 1 packet by mouth daily as needed (pain).    [provider]  baclofen (LIORESAL) 20 MG tablet Take 1 tablet (20 mg total) by mouth 3 (three) times daily as needed for muscle spasms. 05/03/20   Hilts, Legrand Como, MD  famotidine (PEPCID) 20 MG tablet Take 1 tablet (20 mg total) by mouth daily. 08/19/20   Noralyn Pick, NP  gabapentin (NEURONTIN) 100 MG capsule Take 3 capsules (300 mg total) by mouth at bedtime. Take as needed for hip pain. 12/03/19   Scot Jun, FNP  ibuprofen (ADVIL) 800 MG tablet Take 1 tablet (800 mg total) by mouth every 8 (eight) hours as needed. 01/01/19   Ladell Pier, MD  levothyroxine (SYNTHROID) 88 MCG tablet Take 1 tablet (88 mcg total) by mouth as directed. Half a tablet on sundays and 1 tablet the rest of the week 07/01/20   Shamleffer, Melanie Crazier, MD  meloxicam (MOBIC) 7.5 MG tablet Take 1  tablet (7.5 mg total) by mouth daily as needed for pain (hip and back pain). 12/03/19   Scot Jun, FNP  nicotine (NICODERM CQ - DOSED IN MG/24 HOURS) 14 mg/24hr patch Place 1 patch (14 mg total) onto the skin daily. 10/17/18   Ladell Pier, MD  traMADol (ULTRAM) 50 MG tablet Take 1 tablet (50 mg total) by mouth every 6 (six) hours as needed. 05/03/20   Hilts, Michael, MD    Family History Family History  Problem Relation Age of Onset   Hypertension Mother    Heart attack Mother    Hypertension Brother    Hypertension Father    Allergies Father    Allergies Other        children   Breast cancer  Maternal Aunt    Breast cancer Maternal Aunt     Social History Social History   Tobacco Use   Smoking status: Some Days    Packs/day: 0.50    Years: 32.00    Pack years: 16.00    Types: Cigarettes   Smokeless tobacco: Never  Vaping Use   Vaping Use: Never used  Substance Use Topics   Alcohol use: Yes    Comment: occasional beer   Drug use: No     Allergies   Patient has no known allergies.   Review of Systems Review of Systems  Constitutional:  Positive for appetite change, chills, fatigue and fever. Negative for activity change.  HENT:  Positive for congestion and rhinorrhea. Negative for ear pain, sinus pressure, sore throat and trouble swallowing.   Eyes:  Negative for discharge and redness.  Respiratory:  Positive for cough. Negative for chest tightness and shortness of breath.   Cardiovascular:  Negative for chest pain.  Gastrointestinal:  Negative for abdominal pain, diarrhea, nausea and vomiting.  Musculoskeletal:  Negative for myalgias.  Skin:  Negative for rash.  Neurological:  Positive for headaches. Negative for dizziness and light-headedness.    Physical Exam Triage Vital Signs ED Triage Vitals [10/13/20 1843]  Enc Vitals Group     BP 120/88     Pulse Rate (!) 110     Resp 18     Temp (!) 100.6 F (38.1 C)     Temp Source Oral     SpO2 94 %     Weight      Height      Head Circumference      Peak Flow      Pain Score 7     Pain Loc      Pain Edu?      Excl. in [place]?    No data found.  Updated Vital Signs BP 120/88 (BP Location: Left Arm)   Pulse (!) 110   Temp (!) 100.6 F (38.1 C) (Oral)   Resp 18   LMP 04/23/2011   SpO2 94%   Visual Acuity Right Eye Distance:   Left Eye Distance:   Bilateral Distance:    Right Eye Near:   Left Eye Near:    Bilateral Near:     Physical Exam Vitals and nursing note reviewed.  Constitutional:      Appearance: She is well-developed.     Comments: No acute distress  HENT:     Head:  Normocephalic and atraumatic.     Ears:     Comments: Bilateral ears without tenderness to palpation of external auricle, tragus and mastoid, EAC's without erythema or swelling, TM's with good bony landmarks and cone of light.  Non erythematous.      Nose: Nose normal.     Mouth/Throat:     Comments: Oral mucosa pink and moist, no tonsillar enlargement or exudate. Posterior pharynx patent and nonerythematous, no uvula deviation or swelling. Normal phonation.  Eyes:     Conjunctiva/sclera: Conjunctivae normal.  Cardiovascular:     Rate and Rhythm: Normal rate.  Pulmonary:     Effort: Pulmonary effort is normal. No respiratory distress.     Comments: Diminished breath sounds throughout bilateral lung fields Abdominal:     General: There is no distension.  Musculoskeletal:        General: Normal range of motion.     Cervical back: Neck supple.  Skin:    General: Skin is warm and dry.  Neurological:     Mental Status: She is alert and oriented to person, place, and time.     UC Treatments / Results  Labs (all labs ordered are listed, but only abnormal results are displayed) Labs Reviewed  COVID-19, FLU A+B NAA    EKG   Radiology DG Chest 2 View  Result Date: 10/13/2020 CLINICAL DATA:  Cough EXAM: CHEST - 2 VIEW COMPARISON:  03/09/2019 FINDINGS: The heart size and mediastinal contours are within normal limits. Both lungs are clear. The visualized skeletal structures are unremarkable. IMPRESSION: No active cardiopulmonary disease. Electronically Signed   By: Ulyses Jarred M.D.   On: 10/13/2020 19:09    Procedures Procedures (including critical care time)  Medications Ordered in UC Medications  acetaminophen (TYLENOL) tablet 650 mg (650 mg Oral Given 10/13/20 1851)    Initial Impression / Assessment and Plan / UC Course  I have reviewed the triage vital signs and the nursing notes.  Pertinent labs & imaging results that were available during my care of the patient were  reviewed by me and considered in my medical decision making (see chart for details).     X-ray negative for pneumonia, COVID/flu test pending.  Will treat as viral URI with cough and recommending Mucinex DM and Tessalon, given patient's history and tobacco use reported shortness of breath and slightly decreased due to opting to place on course of prednisone to further decrease inflammation in lungs contributing to symptoms.  Advised to continue to monitor closely,Discussed strict return precautions. Patient verbalized understanding and is agreeable with plan.  Final Clinical Impressions(s) / UC Diagnoses   Final diagnoses:  Encounter for screening laboratory testing for COVID-19 virus  Viral URI with cough     Discharge Instructions      Chest x-ray normal, COVID/flu test pending Mucinex DM twice daily to help with congestion and cough Tessalon every 8 hours for cough Prednisone 40 mg daily for 5 days to help with inflammation in chest Tylenol and ibuprofen for fevers Please monitor breathing, follow-up if not improving or worsening     ED Prescriptions     Medication Sig Dispense Auth. Provider   benzonatate (TESSALON) 100 MG capsule Take 1-2 capsules (100-200 mg total) by mouth every 8 (eight) hours. 30 capsule Eldoris Beiser C, PA-C   dextromethorphan-guaiFENesin (MUCINEX DM) 30-600 MG 12hr tablet Take 1 tablet by mouth 2 (two) times daily. 20 tablet Khristen Cheyney C, PA-C   predniSONE (DELTASONE) 20 MG tablet Take 2 tablets (40 mg total) by mouth daily with breakfast for 5 days. 10 tablet Arther Heisler, [place], PA-C      PDMP not reviewed this encounter.   Janith Lima, [place] 10/13/20 2051

## 2020-10-14 LAB — COVID-19, FLU A+B NAA
Influenza A, NAA: NOT DETECTED
Influenza B, NAA: NOT DETECTED
SARS-CoV-2, NAA: DETECTED — AB

## 2020-10-28 ENCOUNTER — Ambulatory Visit (AMBULATORY_SURGERY_CENTER): Payer: 59 | Admitting: Gastroenterology

## 2020-10-28 ENCOUNTER — Other Ambulatory Visit: Payer: Self-pay

## 2020-10-28 ENCOUNTER — Encounter: Payer: Self-pay | Admitting: Gastroenterology

## 2020-10-28 VITALS — BP 137/84 | HR 69 | Temp 98.1°F | Resp 11 | Ht 68.4 in | Wt 153.0 lb

## 2020-10-28 DIAGNOSIS — D509 Iron deficiency anemia, unspecified: Secondary | ICD-10-CM

## 2020-10-28 DIAGNOSIS — K259 Gastric ulcer, unspecified as acute or chronic, without hemorrhage or perforation: Secondary | ICD-10-CM | POA: Diagnosis not present

## 2020-10-28 DIAGNOSIS — D120 Benign neoplasm of cecum: Secondary | ICD-10-CM | POA: Diagnosis not present

## 2020-10-28 DIAGNOSIS — K319 Disease of stomach and duodenum, unspecified: Secondary | ICD-10-CM | POA: Diagnosis not present

## 2020-10-28 DIAGNOSIS — D123 Benign neoplasm of transverse colon: Secondary | ICD-10-CM | POA: Diagnosis not present

## 2020-10-28 DIAGNOSIS — R1310 Dysphagia, unspecified: Secondary | ICD-10-CM | POA: Diagnosis not present

## 2020-10-28 DIAGNOSIS — K219 Gastro-esophageal reflux disease without esophagitis: Secondary | ICD-10-CM

## 2020-10-28 DIAGNOSIS — L98499 Non-pressure chronic ulcer of skin of other sites with unspecified severity: Secondary | ICD-10-CM

## 2020-10-28 DIAGNOSIS — Z1211 Encounter for screening for malignant neoplasm of colon: Secondary | ICD-10-CM

## 2020-10-28 MED ORDER — PANTOPRAZOLE SODIUM 40 MG PO TBEC: 40.0000 mg | DELAYED_RELEASE_TABLET | Freq: Two times a day (BID) | ORAL | 3 refills | Status: DC

## 2020-10-28 MED ORDER — SODIUM CHLORIDE 0.9 % IV SOLN: 500.0000 mL | Freq: Once | INTRAVENOUS | Status: DC

## 2020-10-28 NOTE — Progress Notes (Signed)
VS by CW  I have reviewed the patient's medical history in detail and updated the computerized patient record.  

## 2020-10-28 NOTE — Progress Notes (Signed)
A/ox3, pleased with MAC, report to RN 

## 2020-10-28 NOTE — Op Note (Signed)
[name] Patient Name: [name] Procedure Date: 10/28/2020 3:17 PM MRN: [id number] Endoscopist: Thornton Park MD, MD Age: 55 Referring MD:  Date of Birth: 1965/09/29 Gender: Female Account #: [id number] Procedure:                Colonoscopy Indications:              Screening for colorectal malignant neoplasm, This                            is the patient's first colonoscopy                           History of iron deficiency anemia Medicines:                Monitored Anesthesia Care Procedure:                Pre-Anesthesia Assessment:                           - Prior to the procedure, a History and Physical                            was performed, and patient medications and                            allergies were reviewed. The patient's tolerance of                            previous anesthesia was also reviewed. The risks                            and benefits of the procedure and the sedation                            options and risks were discussed with the patient.                            All questions were answered, and informed consent                            was obtained. Prior Anticoagulants: The patient has                            taken no previous anticoagulant or antiplatelet                            agents. ASA Grade Assessment: II - A patient with                            mild systemic disease. After reviewing the risks                            and benefits, the patient was deemed in  satisfactory condition to undergo the procedure.                           After obtaining informed consent, the colonoscope                            was passed under direct vision. Throughout the                            procedure, the patient's blood pressure, pulse, and                            oxygen saturations were monitored continuously. The                            CF [id number] #[id number] was introduced  through the anus                            and advanced to the 3 cm into the ileum. A second                            forward view of the right colon was performed. The                            colonoscopy was performed without difficulty. The                            patient tolerated the procedure well. The quality                            of the bowel preparation was good. The terminal                            ileum, ileocecal valve, appendiceal orifice, and                            rectum were photographed. Scope In: 3:44:16 PM Scope Out: 4:01:56 PM Scope Withdrawal Time: 0 hours 14 minutes 14 seconds  Total Procedure Duration: 0 hours 17 minutes 40 seconds  Findings:                 The perianal and digital rectal examinations were                            normal.                           Multiple small and large-mouthed diverticula were                            found in the sigmoid colon and descending colon.                           A 2 mm polyp was found in the transverse colon. The  polyp was sessile. The polyp was removed with a                            cold snare. Resection and retrieval were complete.                            Estimated blood loss was minimal.                           A 2 mm polyp was found in the cecum. The polyp was                            sessile. The polyp was removed with a cold snare.                            Resection and retrieval were complete. Estimated                            blood loss was minimal.                           The exam was otherwise without abnormality on                            direct and retroflexion views. Complications:            No immediate complications. Estimated blood loss:                            Minimal. Estimated Blood Loss:     Estimated blood loss was minimal. Impression:               - Diverticulosis in the sigmoid colon and in the                             descending colon.                           - One 2 mm polyp in the transverse colon, removed                            with a cold snare. Resected and retrieved.                           - One 2 mm polyp in the cecum, removed with a cold                            snare. Resected and retrieved.                           - The examination was otherwise normal on direct                            and retroflexion views. Recommendation:           -  Patient has a contact number available for                            emergencies. The signs and symptoms of potential                            delayed complications were discussed with the                            patient. Return to normal activities tomorrow.                            Written discharge instructions were provided to the                            patient.                           - Resume previous diet. High fiber diet recommended.                           - Continue present medications.                           - Await pathology results.                           - Repeat colonoscopy date to be determined after                            pending pathology results are reviewed for                            surveillance.                           - Emerging evidence supports eating a diet of                            fruits, vegetables, grains, calcium, and yogurt                            while reducing red meat and alcohol may reduce the                            risk of colon cancer.                           - Thank you for allowing me to be involved in your                            colon cancer prevention. Thornton Park MD, MD 10/28/2020 4:20:43 PM This report has been signed electronically.

## 2020-10-28 NOTE — Progress Notes (Signed)
Called to room to assist during endoscopic procedure.  Patient ID and intended procedure confirmed with present staff. Received instructions for my participation in the procedure from the performing physician.  

## 2020-10-28 NOTE — Op Note (Signed)
[facility] Patient Name: [name] Procedure Date: 10/28/2020 3:25 PM MRN: [id number] Endoscopist: Thornton Park MD, MD Age: 55 Referring MD:  Date of Birth: 1965-11-05 Gender: Female Account #: [id number] Procedure:                Upper GI endoscopy Indications:              Dysphagia, Iron deficiency anemia Medicines:                Monitored Anesthesia Care Procedure:                Pre-Anesthesia Assessment:                           - Prior to the procedure, a History and Physical                            was performed, and patient medications and                            allergies were reviewed. The patient's tolerance of                            previous anesthesia was also reviewed. The risks                            and benefits of the procedure and the sedation                            options and risks were discussed with the patient.                            All questions were answered, and informed consent                            was obtained. Prior Anticoagulants: The patient has                            taken no previous anticoagulant or antiplatelet                            agents. ASA Grade Assessment: II - A patient with                            mild systemic disease. After reviewing the risks                            and benefits, the patient was deemed in                            satisfactory condition to undergo the procedure.                           After obtaining informed consent, the endoscope was  passed under direct vision. Throughout the                            procedure, the patient's blood pressure, pulse, and                            oxygen saturations were monitored continuously. The                            Endoscope was introduced through the mouth, and                            advanced to the third part of duodenum. The upper                            GI endoscopy was  accomplished without difficulty.                            The patient tolerated the procedure well. Scope In: Scope Out: Findings:                 The examined esophagus was normal. Biopsies were                            taken from the mid/proximal and distal esophagus                            with a cold forceps for histology. Estimated blood                            loss was minimal.                           Many small to medium sized non-bleeding cratered                            gastric ulcers with no stigmata of bleeding were                            found in the gastric body and in the gastric                            antrum. Biopsies were taken from the antrum, body,                            and fundus with a cold forceps for histology.                            Estimated blood loss was minimal.                           A medium-sized hiatal hernia was present.  The examined duodenum was normal. Complications:            No immediate complications. Estimated blood loss:                            Minimal. Estimated Blood Loss:     Estimated blood loss was minimal. Impression:               - Normal esophagus. Biopsied.                           - Non-bleeding gastric ulcers with no stigmata of                            bleeding. Biopsied.                           - Normal examined duodenum. Recommendation:           - Patient has a contact number available for                            emergencies. The signs and symptoms of potential                            delayed complications were discussed with the                            patient. Return to normal activities tomorrow.                            Written discharge instructions were provided to the                            patient.                           - Resume previous diet.                           - Continue present medications.                           - Start  pantoprazole 40 mg BID for at least 10                            weeks.                           - No aspirin, ibuprofen, naproxen, or other                            non-steroidal anti-inflammatory drugs.                           - Await pathology results. Thornton Park MD, MD 10/28/2020 4:15:46 PM This report has been signed electronically.

## 2020-10-28 NOTE — Patient Instructions (Addendum)
Handouts given on polyps and diverticulosis  YOU HAD AN ENDOSCOPIC PROCEDURE TODAY AT [facility]:   Refer to the procedure report that was given to you for any specific questions about what was found during the examination.  If the procedure report does not answer your questions, please call your gastroenterologist to clarify.  If you requested that your care partner not be given the details of your procedure findings, then the procedure report has been included in a sealed envelope for you to review at your convenience later.  YOU SHOULD EXPECT: Some feelings of bloating in the abdomen. Passage of more gas than usual.  Walking can help get rid of the air that was put into your GI tract during the procedure and reduce the bloating. If you had a lower endoscopy (such as a colonoscopy or flexible sigmoidoscopy) you may notice spotting of blood in your stool or on the toilet paper. If you underwent a bowel prep for your procedure, you may not have a normal bowel movement for a few days.  Please Note:  You might notice some irritation and congestion in your nose or some drainage.  This is from the oxygen used during your procedure.  There is no need for concern and it should clear up in a day or so.  SYMPTOMS TO REPORT IMMEDIATELY:  Following lower endoscopy (colonoscopy or flexible sigmoidoscopy):  Excessive amounts of blood in the stool  Significant tenderness or worsening of abdominal pains  Swelling of the abdomen that is new, acute  Fever of 100F or higher  Following upper endoscopy (EGD)  Vomiting of blood or coffee ground material  New chest pain or pain under the shoulder blades  Painful or persistently difficult swallowing  New shortness of breath  Fever of 100F or higher  Black, tarry-looking stools  For urgent or emergent issues, a gastroenterologist can be reached at any hour by calling [phone]. Do not use MyChart messaging for urgent concerns.    DIET:   We do recommend a small meal at first, but then you may proceed to your regular diet.  Drink plenty of fluids but you should avoid alcoholic beverages for 24 hours.  ACTIVITY:  You should plan to take it easy for the rest of today and you should NOT DRIVE or use heavy machinery until tomorrow (because of the sedation medicines used during the test).    FOLLOW UP: Our staff will call the number listed on your records 48-72 hours following your procedure to check on you and address any questions or concerns that you may have regarding the information given to you following your procedure. If we do not reach you, we will leave a message.  We will attempt to reach you two times.  During this call, we will ask if you have developed any symptoms of COVID 19. If you develop any symptoms (ie: fever, flu-like symptoms, shortness of breath, cough etc.) before then, please call [phone].  If you test positive for Covid 19 in the 2 weeks post procedure, please call and report this information to [place].    If any biopsies were taken you will be contacted by phone or by letter within the next 1-3 weeks.  Please call us at [phone] if you have not heard about the biopsies in 3 weeks.    SIGNATURES/CONFIDENTIALITY: You and/or your care partner have signed paperwork which will be entered into your electronic medical record.  These signatures attest to the fact that  that the information above on your After Visit Summary has been reviewed and is understood.  Full responsibility of the confidentiality of this discharge information lies with you and/or your care-partner.

## 2020-11-16 ENCOUNTER — Encounter: Payer: Self-pay | Admitting: Gastroenterology

## 2020-11-20 ENCOUNTER — Inpatient Hospital Stay: Admission: RE | Admit: 2020-11-20 | Payer: 59 | Source: Ambulatory Visit

## 2020-11-24 ENCOUNTER — Other Ambulatory Visit: Payer: 59

## 2021-01-06 ENCOUNTER — Ambulatory Visit: Payer: 59 | Admitting: Internal Medicine

## 2021-01-06 ENCOUNTER — Other Ambulatory Visit: Payer: Self-pay

## 2021-01-06 VITALS — BP 122/80 | HR 75 | Ht 67.0 in | Wt 152.3 lb

## 2021-01-06 DIAGNOSIS — E0500 Thyrotoxicosis with diffuse goiter without thyrotoxic crisis or storm: Secondary | ICD-10-CM

## 2021-01-06 DIAGNOSIS — E890 Postprocedural hypothyroidism: Secondary | ICD-10-CM | POA: Diagnosis not present

## 2021-01-06 LAB — TSH: TSH: 2.15 u[IU]/mL (ref 0.35–5.50)

## 2021-01-06 NOTE — Patient Instructions (Signed)

## 2021-01-06 NOTE — Progress Notes (Signed)
Name: [name]  MRN/ DOB: [id number], 1966-02-22    Age/ Sex: 55 y.o., female     PCP: Ladell Pier, MD   Reason for Endocrinology Evaluation: [name]' Disease     Initial Endocrinology Clinic Visit: 01/09/2019    PATIENT IDENTIFIER: [name] is a 55 y.o., female with a past medical history of granulomatous lung disease, headaches, graves' disease and anxiety  . She has followed with [facility] since 01/09/2019  for consultative assistance with management of her graves' disease  HISTORICAL SUMMARY: The patient was first diagnosed with Graves' disease ~2012  .Over the years she has been prescribed  methimazole , but would take it on and off due to non-compliance issues , her thyroid condition has never been optimally controlled.   On her initial visit to our clinic she was not taking methimazole for ~ 3 months prior to her presentation.   She is S/P RAI ablation on 02/21/2019 with 13.8 mCi I-131 sodium iodide LT- 4 replacement started 04/2019 due to low FT4.   No FH of thyroid disease  Sister with lupus   SUBJECTIVE:     Today (01/06/2021):  [name] is here for a follow up on hyperthyroidism . She is S/P RAI ablation in 01/2019  Weight has been stable  Denies constipation  Denies palpitations  Denies local neck symptoms  Denies double vision , saw ophthalmology - pending CT scan      She was seen by GI 07/2020 for evaluation of dysphagia. S/P EGD and  Colonoscopy     Levothyroxine 88 mcg , Half a tablet on Sundays, 1 tablet the rest of the week    HISTORY:  Past Medical History:  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[place])    Grave's disease    Graves Disease   History of radioactive iodine thyroid ablation    02/21/2019   Menometrorrhagia    Migraines    Past Surgical History:  Past Surgical History:  Procedure Laterality Date   ABDOMINAL  HYSTERECTOMY     TUBAL LIGATION     Social History:  reports that she has been smoking cigarettes. She has a 16.00 pack-year smoking history. She has never used smokeless tobacco. She reports current alcohol use. She reports that she does not use drugs. Family History:  Family History  Problem Relation Age of Onset   Hypertension Mother    Heart attack Mother    Hypertension Brother    Hypertension Father    Allergies Father    Allergies Other        children   Breast cancer Maternal Aunt    Breast cancer Maternal Aunt      HOME MEDICATIONS: Allergies as of 01/06/2021   No Known Allergies      Medication List        Accurate as of January 06, 2021  7:17 AM. If you have any questions, ask your nurse or doctor.          aspirin EC 81 MG tablet Take 1 tablet (81 mg total) by mouth daily.   baclofen 20 MG tablet Commonly known as: LIORESAL Take 1 tablet (20 mg total) by mouth 3 (three) times daily as needed for muscle spasms.   BC HEADACHE PO Take 1 packet by mouth daily as needed (pain).   benzonatate 100 MG capsule Commonly known as: TESSALON Take 1-2  capsules (100-200 mg total) by mouth every 8 (eight) hours.   dextromethorphan-guaiFENesin 30-600 MG 12hr tablet Commonly known as: MUCINEX DM Take 1 tablet by mouth 2 (two) times daily.   famotidine 20 MG tablet Commonly known as: PEPCID Take 1 tablet (20 mg total) by mouth daily.   gabapentin 100 MG capsule Commonly known as: Neurontin Take 3 capsules (300 mg total) by mouth at bedtime. Take as needed for hip pain.   ibuprofen 800 MG tablet Commonly known as: ADVIL Take 1 tablet (800 mg total) by mouth every 8 (eight) hours as needed.   levothyroxine 88 MCG tablet Commonly known as: SYNTHROID Take 1 tablet (88 mcg total) by mouth as directed. Half a tablet on sundays and 1 tablet the rest of the week   meloxicam 7.5 MG tablet Commonly known as: MOBIC Take 1 tablet (7.5 mg total) by mouth daily as  needed for pain (hip and back pain).   nicotine 14 mg/24hr patch Commonly known as: NICODERM CQ - dosed in mg/24 hours Place 1 patch (14 mg total) onto the skin daily.   pantoprazole 40 MG tablet Commonly known as: PROTONIX Take 1 tablet (40 mg total) by mouth 2 (two) times daily.   traMADol 50 MG tablet Commonly known as: ULTRAM Take 1 tablet (50 mg total) by mouth every 6 (six) hours as needed.          OBJECTIVE:   PHYSICAL EXAM: VS: BP 122/80   Pulse 75   Ht '5\' 7"'$  (1.702 m)   Wt 152 lb 4.8 oz (69.1 kg)   LMP 04/23/2011   SpO2 96%   BMI 23.85 kg/m    EXAM: General: Pt appears well and is in NAD  Neck: General: Supple without adenopathy. Thyroid: Thyroid size normal.  No goiter  appreciated. No thyroid bruit.  Lungs: Clear with good BS bilat with no rales, rhonchi, or wheezes  Heart: Auscultation: RRR.  Abdomen: Normoactive bowel sounds, soft, nontender, without masses or organomegaly palpable  Extremities:  BL LE: No pretibial edema normal ROM and strength.  Mental Status: Judgment, insight: Intact Orientation: Oriented to time, place, and person Mood and affect: No depression, anxiety, or agitation     DATA REVIEWED:  Results for KUSHI, NORTON (MRN [id number]) as of 01/07/2021 12:17  Ref. Range 01/06/2021 09:48  TSH Latest Ref Range: 0.35 - 5.50 uIU/mL 2.15    ASSESSMENT / PLAN / RECOMMENDATIONS:   Postablative Hypothyroidism  - She is S/P RAI Ablation on 02/21/2019 with 13.8 mCi I-131 sodium iodide - She is clinically euthyroid  - TSH normal   Medications   Continue  Levothyroxine 88 mcg , Half a tablet on Sundays, 1 tablet the rest of the week   2. Graves' Disease:   -No extrathyroidal manifestations of Graves' disease    F/U in 1 year    Signed electronically by: Mack Guise, MD  [facility]  [facility] [address] [place], [place] [address] Phone: [phone] FAX: [phone]       CC: Ladell Pier, MD [place] [address] Phone: [phone]  Fax: [phone]   Return to Endocrinology clinic as below: Future Appointments  Date Time Provider Clifford  01/06/2021  8:50 AM [name], Lori Crazier, MD [id number] None

## 2021-01-07 ENCOUNTER — Encounter: Payer: Self-pay | Admitting: Internal Medicine

## 2021-01-07 MED ORDER — LEVOTHYROXINE SODIUM 88 MCG PO TABS: 88.0000 ug | ORAL_TABLET | ORAL | 3 refills | Status: DC

## 2021-04-04 ENCOUNTER — Ambulatory Visit
Admission: EM | Admit: 2021-04-04 | Discharge: 2021-04-04 | Disposition: A | Discharge status: Home / Self Care | Payer: 59 | Attending: Emergency Medicine | Admitting: Emergency Medicine

## 2021-04-04 ENCOUNTER — Other Ambulatory Visit: Payer: Self-pay

## 2021-04-04 DIAGNOSIS — H60501 Unspecified acute noninfective otitis externa, right ear: Secondary | ICD-10-CM | POA: Diagnosis not present

## 2021-04-04 DIAGNOSIS — J0100 Acute maxillary sinusitis, unspecified: Secondary | ICD-10-CM

## 2021-04-04 DIAGNOSIS — J00 Acute nasopharyngitis [common cold]: Secondary | ICD-10-CM

## 2021-04-04 DIAGNOSIS — H6121 Impacted cerumen, right ear: Secondary | ICD-10-CM

## 2021-04-04 DIAGNOSIS — H9201 Otalgia, right ear: Secondary | ICD-10-CM

## 2021-04-04 DIAGNOSIS — R0689 Other abnormalities of breathing: Secondary | ICD-10-CM

## 2021-04-04 DIAGNOSIS — R0981 Nasal congestion: Secondary | ICD-10-CM

## 2021-04-04 MED ORDER — CEFDINIR 300 MG PO CAPS: 300.0000 mg | ORAL_CAPSULE | Freq: Two times a day (BID) | ORAL | 0 refills | Status: AC

## 2021-04-04 MED ORDER — CIPROFLOXACIN-DEXAMETHASONE 0.3-0.1 % OT SUSP: 4.0000 [drp] | Freq: Two times a day (BID) | OTIC | 0 refills | Status: DC

## 2021-04-04 MED ORDER — METHYLPREDNISOLONE 4 MG PO TBPK: ORAL_TABLET | ORAL | 0 refills | Status: DC

## 2021-04-04 MED ORDER — METHYLPREDNISOLONE SODIUM SUCC 125 MG IJ SOLR
125.0000 mg | Freq: Once | INTRAMUSCULAR | Status: AC
  Administered 2021-04-04: 125 mg via INTRAMUSCULAR

## 2021-04-04 MED ORDER — IPRATROPIUM BROMIDE 0.06 % NA SOLN: 2.0000 | Freq: Four times a day (QID) | NASAL | 12 refills | Status: DC

## 2021-04-04 MED ORDER — PROMETHAZINE-DM 6.25-15 MG/5ML PO SYRP: 5.0000 mL | ORAL_SOLUTION | Freq: Four times a day (QID) | ORAL | 0 refills | Status: DC | PRN

## 2021-04-04 NOTE — ED Provider Notes (Addendum)
[facility]    CSN: [id number] Arrival date & time: 04/04/21  0800    HISTORY  No chief complaint on file.  HPI [name] is a 55 y.o. female. Pt c/o 1 week of facial pressure, sore throat, headache and congestion.  BP elevated on arrival, pt afebrile, O2 and HR normal.  Patient reports a history of asthma and allergies, states not currently taking medications for either 1.  Patient states she also has never been prescribed an inhaler.  The history is provided by the patient.  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   Graves disease 04/10/2019   Prediabetes 10/18/2018   Tinea pedis 07/31/2014   Screening for HIV (human immunodeficiency virus) 07/31/2014   Cramp of limb 01/13/2011   Sarcoidosis of lung ([place]) 12/30/2010   Rhinitis, allergic 11/29/2010   Urticaria, 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    Home Medications    Prior to Admission medications   Medication Sig Start Date End Date Taking? Authorizing Provider  cefdinir (OMNICEF) 300 MG capsule Take 1 capsule (300 mg total) by mouth 2 (two) times daily for 5 days. 04/04/21 04/09/21 Yes Lynden Oxford Scales, PA-C  ciprofloxacin-dexamethasone (CIPRODEX) OTIC suspension Place 4 drops into the right ear 2 (two) times  daily. 04/04/21  Yes Lynden Oxford Scales, PA-C  ipratropium (ATROVENT) 0.06 % nasal spray Place 2 sprays into both nostrils 4 (four) times daily. As needed for nasal congestion, runny nose 04/04/21  Yes Lynden Oxford Scales, PA-C  methylPREDNISolone (MEDROL DOSEPAK) 4 MG TBPK tablet Take 24 mg on day 1, 20 mg on day 2, 16 mg on day 3, 12 mg on day 4, 8 mg on day 5, 4 mg on day 6. 04/04/21  Yes Lynden Oxford Scales, PA-C  promethazine-dextromethorphan (PROMETHAZINE-DM) 6.25-15 MG/5ML syrup Take 5 mLs by mouth 4 (four) times daily as needed for cough. 04/04/21  Yes Lynden Oxford Scales, PA-C  famotidine (PEPCID) 20 MG tablet Take 1 tablet (20 mg total) by mouth daily. 08/19/20   Noralyn Pick, NP  gabapentin (NEURONTIN) 100 MG capsule Take 3 capsules (300 mg total) by mouth at bedtime. Take as needed for hip pain. 12/03/19   Scot Jun, FNP  levothyroxine (SYNTHROID) 88 MCG tablet Take 1 tablet (88 mcg total) by mouth as directed. Half a tablet on sundays and 1 tablet the rest of the week 01/07/21   Shamleffer, Melanie Crazier, MD  nicotine (NICODERM CQ - DOSED IN MG/24 HOURS) 14 mg/24hr patch Place 1 patch (14 mg total) onto the skin daily. 10/17/18   Ladell Pier, MD  pantoprazole (PROTONIX) 40 MG tablet Take 1 tablet (40 mg total) by mouth 2 (two) times daily. 10/28/20   Thornton Park,  MD   Family History Family History  Problem Relation Age of Onset   Hypertension Mother    Heart attack Mother    Hypertension Brother    Hypertension Father    Allergies Father    Allergies Other        children   Breast cancer Maternal Aunt    Breast cancer Maternal Aunt    Social History Social History   Tobacco Use   Smoking status: Some Days    Packs/day: 0.50    Years: 32.00    Pack years: 16.00    Types: Cigarettes   Smokeless tobacco: Never  Vaping Use   Vaping Use: Never used  Substance Use Topics   Alcohol use: Yes    Comment: occasional beer   Drug use: No    Allergies   Patient has no known allergies.  Review of Systems Review of Systems Pertinent findings noted in history of present illness.   Physical Exam Triage Vital Signs ED Triage Vitals  Enc Vitals Group     BP 02/25/21 0827 (!) 147/82     Pulse Rate 02/25/21 0827 72     Resp 02/25/21 0827 18     Temp 02/25/21 0827 98.3 F (36.8 C)     Temp Source 02/25/21 0827 Oral     SpO2 02/25/21 0827 98 %     Weight --      Height --      Head Circumference --      Peak Flow --      Pain Score 02/25/21 0826 5     Pain Loc --      Pain Edu? --      Excl. in [place]? --   No data found.  Updated Vital Signs BP (!) 146/89 (BP Location: Left Arm)   Pulse 91   Temp 98.7 F (37.1 C) (Oral)   Resp 18   LMP 04/23/2011   SpO2 96%   Physical Exam Constitutional:      Appearance: She is ill-appearing.  HENT:     Head: Normocephalic and atraumatic.     Salivary Glands: Right salivary gland is not diffusely enlarged or tender. Left salivary gland is not diffusely enlarged or tender.     Right Ear: Ear canal and external ear normal. There is impacted cerumen.     Left Ear: Tympanic membrane, ear canal and external ear normal.     Nose: Congestion and rhinorrhea present. Rhinorrhea is purulent.     Right Turbinates: Enlarged, swollen and pale.     Left Turbinates: Enlarged, swollen and pale.     Right Sinus: Maxillary sinus tenderness and frontal sinus tenderness present.     Left Sinus: Maxillary sinus tenderness and frontal sinus tenderness present.     Mouth/Throat:     Mouth: Mucous membranes are moist.     Pharynx: Pharyngeal swelling, posterior oropharyngeal erythema and uvula swelling present.     Tonsils: No tonsillar exudate. 0 on the right. 0 on the left.  Cardiovascular:     Rate and Rhythm: Normal rate and regular rhythm.     Pulses: Normal pulses.  Pulmonary:     Effort: Pulmonary effort is normal. No accessory muscle usage, prolonged expiration or respiratory distress.      Breath sounds: No stridor. No wheezing, rhonchi or rales.     Comments: Turbulent breath sounds throughout without wheeze, rale, rhonchi. Abdominal:     General: Abdomen is flat. Bowel sounds are normal.  Palpations: Abdomen is soft.  Musculoskeletal:        General: Normal range of motion.  Lymphadenopathy:     Cervical: Cervical adenopathy present.     Right cervical: Superficial cervical adenopathy and posterior cervical adenopathy present.     Left cervical: Superficial cervical adenopathy and posterior cervical adenopathy present.  Skin:    General: Skin is warm and dry.  Neurological:     General: No focal deficit present.     Mental Status: She is alert and oriented to person, place, and time.     Motor: Motor function is intact.     Coordination: Coordination is intact.     Gait: Gait is intact.     Deep Tendon Reflexes: Reflexes are normal and symmetric.  Psychiatric:        Attention and Perception: Attention and perception normal.        Mood and Affect: Mood and affect normal.        Speech: Speech normal.        Behavior: Behavior normal. Behavior is cooperative.        Thought Content: Thought content normal.    Visual Acuity Right Eye Distance:   Left Eye Distance:   Bilateral Distance:    Right Eye Near:   Left Eye Near:    Bilateral Near:     [facility] / Diagnostics / Procedures:    EKG  Radiology No results found.  Procedures Ear Cerumen Removal  Date/Time: 04/04/2021 9:13 AM Performed by: Johna Sheriff, RN Authorized by: Lynden Oxford Scales, PA-C   Consent:    Consent obtained:  Verbal   Consent given by:  Patient   Risks discussed:  Bleeding, infection, pain, TM perforation, incomplete removal and dizziness   Alternatives discussed:  No treatment, delayed treatment, alternative treatment, referral and observation Universal protocol:    Procedure explained and questions answered to patient or proxy's satisfaction: yes      Patient identity confirmed:  Verbally with patient Procedure details:    Location:  R ear   Procedure type: irrigation     Procedure outcomes: cerumen removed   Post-procedure details:    Inspection:  No bleeding   Hearing quality:  Normal   Procedure completion:  Tolerated (including critical care time)  UC Diagnoses / Final Clinical Impressions(s)   I have reviewed the triage vital signs and the nursing notes.  Pertinent labs & imaging results that were available during my care of the patient were reviewed by me and considered in my medical decision making (see chart for details).   Final diagnoses:  Acute maxillary sinusitis, recurrence not specified  Decreased breath sounds  Nasal congestion  Acute rhinitis  Impacted cerumen of right ear  Otalgia of right ear  Acute otitis externa of right ear, unspecified type   Acute maxillary and frontal sinusitis with questionable bacterial superinfection.  Patient provided with Solu-Medrol injection in the office to improve decreased breath sounds with good relief.  Patient provided with a follow-up tapering dose of methylprednisolone via Medrol Dosepak.  Patient also provided with prescription for cefdinir for 5 days and Atrovent nasal spray to help further dry up nasal passages.  Promethazine DM provided for nighttime cough which will likely involve as her lungs open up and she is able to expectorate more efficiently.  Return precautions advised.  Conservative care recommended.  Note provided for work.  Ciprodex provided for otitis externa after cerumen removal.  ED Prescriptions     Medication  Sig Dispense Auth. Provider   ipratropium (ATROVENT) 0.06 % nasal spray Place 2 sprays into both nostrils 4 (four) times daily. As needed for nasal congestion, runny nose 15 mL Lynden Oxford Scales, PA-C   promethazine-dextromethorphan (PROMETHAZINE-DM) 6.25-15 MG/5ML syrup Take 5 mLs by mouth 4 (four) times daily as needed for cough. 180 mL Lynden Oxford Scales, PA-C   methylPREDNISolone (MEDROL DOSEPAK) 4 MG TBPK tablet Take 24 mg on day 1, 20 mg on day 2, 16 mg on day 3, 12 mg on day 4, 8 mg on day 5, 4 mg on day 6. 21 tablet Lynden Oxford Scales, PA-C   cefdinir (OMNICEF) 300 MG capsule Take 1 capsule (300 mg total) by mouth 2 (two) times daily for 5 days. 10 capsule Lynden Oxford Scales, PA-C   ciprofloxacin-dexamethasone (CIPRODEX) OTIC suspension Place 4 drops into the right ear 2 (two) times daily. 7.5 mL Lynden Oxford Scales, PA-C      PDMP not reviewed this encounter.  Pending results:  Labs Reviewed - No data to display  Medications Ordered in UC: Medications  methylPREDNISolone sodium succinate (SOLU-MEDROL) 125 mg/2 mL injection 125 mg (125 mg Intramuscular Given 04/04/21 0831)    Disposition Upon Discharge:  Condition: stable for discharge home Home: take medications as prescribed; routine discharge instructions as discussed; follow up as advised.  Patient presented with an acute illness with associated systemic symptoms and significant discomfort requiring urgent management. In my opinion, this is a condition that a prudent lay person (someone who possesses an average knowledge of health and medicine) may potentially expect to result in complications if not addressed urgently such as respiratory distress, impairment of bodily function or dysfunction of bodily organs.   Routine symptom specific, illness specific and/or disease specific instructions were discussed with the patient and/or caregiver at length.   As such, the patient has been evaluated and assessed, work-up was performed and treatment was provided in alignment with urgent care protocols and evidence based medicine.  Patient/parent/caregiver has been advised that the patient may require follow up for further testing and treatment if the symptoms continue in spite of treatment, as clinically indicated and appropriate.  The patient was tested for  COVID-19, Influenza and/or RSV, then the patient/parent/guardian was advised to isolate at home pending the results of his/her diagnostic coronavirus test and potentially longer if they're positive. I have also advised pt that if his/her COVID-19 test returns positive, it's recommended to self-isolate for at least 10 days after symptoms first appeared AND until fever-free for 24 hours without fever reducer AND other symptoms have improved or resolved. Discussed self-isolation recommendations as well as instructions for household member/close contacts as per the [facility] and [place] DHHS, and also gave patient the [place] packet with this information.  Patient/parent/caregiver has been advised to return to the [facility] or PCP in 3-5 days if no better; to PCP or the Emergency Department if new signs and symptoms develop, or if the current signs or symptoms continue to change or worsen for further workup, evaluation and treatment as clinically indicated and appropriate  The patient will follow up with their current PCP if and as advised. If the patient does not currently have a PCP we will assist them in obtaining one.   The patient may need specialty follow up if the symptoms continue, in spite of conservative treatment and management, for further workup, evaluation, consultation and treatment as clinically indicated and appropriate.  Patient/parent/caregiver verbalized understanding and agreement of plan as discussed.  All  questions were addressed during visit.  Please see discharge instructions below for further details of plan.  Discharge Instructions:   Discharge Instructions      To aggressively treat your sinusitis, you received an injection of Solu-Medrol which should also keep your lungs calm.  Please follow this up with a tapering dose of the same steroid, provided to you in the form of a prescription Medrol dose pack, please take 1 row of tablets every morning with breakfast to complete.   Also for your  sinus congestion, please begin Atrovent nasal spray, 2 sprays in each nare up to 4 times daily as needed for congestion and runny nose.  To prevent onset of acute bacterial sinusitis, I recommend that you take an antibiotic called cefdinir, 1 capsule twice daily for the next 5 days.  For cough, I provided you with a cough syrup called promethazine DM which can take 4 times daily as needed.  You can take between 2.5 to 5 mL depending on how sleepy it makes you and how effective the cough suppressant is.  You will have 4 prescriptions available at your pharmacy.  Below is a list of other medications that you can try for symptomatic treatment.  I do not believe that viral testing is indicated at this time given the duration of your symptoms.  Conservative care is recommended at this time.  This includes rest, pushing clear fluids and activity as tolerated.  You may also noticed that your appetite is reduced, this is okay as long as they are drinking plenty of clear fluids. Please remain home from work, school, public places until you have been fever free for 24 hours without the use of antifever medications such as Tylenol or ibuprofen.  Ibuprofen  (Advil, Motrin): This is a good anti-inflammatory medication which addresses aches and pains and, to some degree, congestion in the nasal passages.  I recommend giving between 400 to 600 mg every 6-8 hours as needed.  Acetaminophen (Tylenol): This is a good fever reducer.  If there body temperature rises above 101.5 as measured with a thermometer, it is recommended that you give them 1,000 mg every 6-8 hours until they are temperature falls below 101.5, please not take more than 3,000 mg of acetaminophen either as a separate medication or as in ingredient in an over-the-counter cold/flu preparation within a 24-hour period  Pseudoephedrine (Sudafed): This is a decongestant.  This medication has to be purchased from the pharmacist counter, I recommend giving 2  tablets, 60 mg, 2-3 times a day as needed to relieve runny nose and sinus drainage.  Guaifenesin (Robitussin, Mucinex): This is an expectorant.  This helps break up chest congestion and loosen up thick nasal drainage making phlegm and drainage more liquid and therefore easier to remove.  I recommend being 400 mg three times daily as needed.  Dextromethorphan (any cough medicine with the letters "DM" added to it's name such as Robitussin DM): This is a cough suppressant.  This is often recommended to be taken at nighttime to suppress cough and help children sleep.  Give dosage as directed on the bottle.   Chloraseptic Throat Spray: Spray 5 sprays into affected area every 2 hours, hold for 15 seconds and either swallow or spit it out.  This is a excellent numbing medication because it is a spray, you can put it right where you needed and so sucking on a lozenge and numbing your entire mouth.  Please follow-up within the next 3 to 5 days either  with your primary care provider or urgent care if your symptoms do not resolve.  If you do not have a primary care provider, we will assist you in finding one.  For the irritation in your right ear, please put 4 drops of Ciprodex in your ear canal twice daily, if you lay down while you are doing this and allow the drops to stay in for a few minutes this helps.  You can stop using this when you feels better.        Lynden Oxford Scales, PA-C 04/04/21 0906    Lynden Oxford Scales, PA-C 04/04/21 [phone]

## 2021-04-04 NOTE — Discharge Instructions (Addendum)
To aggressively treat your sinusitis, you received an injection of Solu-Medrol which should also keep your lungs calm.  Please follow this up with a tapering dose of the same steroid, provided to you in the form of a prescription Medrol dose pack, please take 1 row of tablets every morning with breakfast to complete.   Also for your sinus congestion, please begin Atrovent nasal spray, 2 sprays in each nare up to 4 times daily as needed for congestion and runny nose.  To prevent onset of acute bacterial sinusitis, I recommend that you take an antibiotic called cefdinir, 1 capsule twice daily for the next 5 days.  For cough, I provided you with a cough syrup called promethazine DM which can take 4 times daily as needed.  You can take between 2.5 to 5 mL depending on how sleepy it makes you and how effective the cough suppressant is.  You will have 4 prescriptions available at your pharmacy.  Below is a list of other medications that you can try for symptomatic treatment.  I do not believe that viral testing is indicated at this time given the duration of your symptoms.  Conservative care is recommended at this time.  This includes rest, pushing clear fluids and activity as tolerated.  You may also noticed that your appetite is reduced, this is okay as long as they are drinking plenty of clear fluids. Please remain home from work, school, public places until you have been fever free for 24 hours without the use of antifever medications such as Tylenol or ibuprofen.  Ibuprofen  (Advil, Motrin): This is a good anti-inflammatory medication which addresses aches and pains and, to some degree, congestion in the nasal passages.  I recommend giving between 400 to 600 mg every 6-8 hours as needed.  Acetaminophen (Tylenol): This is a good fever reducer.  If there body temperature rises above 101.5 as measured with a thermometer, it is recommended that you give them 1,000 mg every 6-8 hours until they are  temperature falls below 101.5, please not take more than 3,000 mg of acetaminophen either as a separate medication or as in ingredient in an over-the-counter cold/flu preparation within a 24-hour period  Pseudoephedrine (Sudafed): This is a decongestant.  This medication has to be purchased from the pharmacist counter, I recommend giving 2 tablets, 60 mg, 2-3 times a day as needed to relieve runny nose and sinus drainage.  Guaifenesin (Robitussin, Mucinex): This is an expectorant.  This helps break up chest congestion and loosen up thick nasal drainage making phlegm and drainage more liquid and therefore easier to remove.  I recommend being 400 mg three times daily as needed.  Dextromethorphan (any cough medicine with the letters "DM" added to it's name such as Robitussin DM): This is a cough suppressant.  This is often recommended to be taken at nighttime to suppress cough and help children sleep.  Give dosage as directed on the bottle.   Chloraseptic Throat Spray: Spray 5 sprays into affected area every 2 hours, hold for 15 seconds and either swallow or spit it out.  This is a excellent numbing medication because it is a spray, you can put it right where you needed and so sucking on a lozenge and numbing your entire mouth.  Please follow-up within the next 3 to 5 days either with your primary care provider or urgent care if your symptoms do not resolve.  If you do not have a primary care provider, we will assist you in  finding one.  For the irritation in your right ear, please put 4 drops of Ciprodex in your ear canal twice daily, if you lay down while you are doing this and allow the drops to stay in for a few minutes this helps.  You can stop using this when you feels better.

## 2021-04-04 NOTE — ED Triage Notes (Addendum)
Pt c/o having facial pressure, sore throat, headache, and congestion that began a week ago.

## 2021-07-20 ENCOUNTER — Ambulatory Visit
Admission: EM | Admit: 2021-07-20 | Discharge: 2021-07-20 | Disposition: A | Discharge status: Home / Self Care | Payer: 59 | Attending: Emergency Medicine | Admitting: Emergency Medicine

## 2021-07-20 ENCOUNTER — Other Ambulatory Visit: Payer: Self-pay

## 2021-07-20 DIAGNOSIS — J039 Acute tonsillitis, unspecified: Secondary | ICD-10-CM | POA: Diagnosis present

## 2021-07-20 DIAGNOSIS — J0141 Acute recurrent pansinusitis: Secondary | ICD-10-CM | POA: Insufficient documentation

## 2021-07-20 DIAGNOSIS — J029 Acute pharyngitis, unspecified: Secondary | ICD-10-CM | POA: Diagnosis not present

## 2021-07-20 LAB — POCT RAPID STREP A (OFFICE): Rapid Strep A Screen: NEGATIVE

## 2021-07-20 MED ORDER — FLUCONAZOLE 150 MG PO TABS: ORAL_TABLET | ORAL | 2 refills | Status: DC

## 2021-07-20 MED ORDER — CEFDINIR 300 MG PO CAPS: 300.0000 mg | ORAL_CAPSULE | Freq: Two times a day (BID) | ORAL | 0 refills | Status: AC

## 2021-07-20 MED ORDER — METHYLPREDNISOLONE SODIUM SUCC 125 MG IJ SOLR
125.0000 mg | Freq: Once | INTRAMUSCULAR | Status: AC
  Administered 2021-07-20: 125 mg via INTRAMUSCULAR

## 2021-07-20 MED ORDER — FLUTICASONE PROPIONATE 50 MCG/ACT NA SUSP: 2.0000 | Freq: Every day | NASAL | 1 refills | Status: DC

## 2021-07-20 MED ORDER — FEXOFENADINE HCL 180 MG PO TABS: 180.0000 mg | ORAL_TABLET | Freq: Every day | ORAL | 0 refills | Status: DC

## 2021-07-20 MED ORDER — ALBUTEROL SULFATE HFA 108 (90 BASE) MCG/ACT IN AERS: 2.0000 | INHALATION_SPRAY | Freq: Four times a day (QID) | RESPIRATORY_TRACT | 2 refills | Status: DC | PRN

## 2021-07-20 MED ORDER — METHYLPREDNISOLONE 4 MG PO TABS: ORAL_TABLET | ORAL | 0 refills | Status: AC

## 2021-07-20 MED ORDER — IPRATROPIUM BROMIDE 0.06 % NA SOLN: 2.0000 | Freq: Four times a day (QID) | NASAL | 2 refills | Status: DC

## 2021-07-20 NOTE — Discharge Instructions (Addendum)
Your strep test today is negative.  Throat culture will be performed per our protocol.  The result of your throat culture will be posted to your MyChart once it is complete, this typically takes 3 to 5 days.  If there is a positive result, you will be contacted by phone and antibiotics will be prescribed for you. ?  ?Based on my physical exam today and the history that you provided, I believe that you have bacterial pharyngitis.  Bacterial pharyngitis is most commonly caused by bacteria called group A Streptococcus but there are many other culprits.   The rapid strep test that we performed in the office only catches 40% of known cases of group A streptococcal pharyngitis.  Additionally, and unfortunately, the throat culture that we perform here in the urgent care only evaluates for group A strep and not any of the other bacteria  that are known to cause bacterial pharyngitis.    ?  ?Because you have significant swelling of both tonsils, significant redness of both tonsils and white patches on both tonsils, I have prescribed Cefdinir 300 mg twice daily for 10 days to treat you for bacterial pharyngitis.  Cefdinir covers group A strep along with other known causative organisms.  Please take this antibiotic as prescribed and do not skip any doses.  Once you have been on antibiotics for a full 24 hours, you are no longer considered contagious.     ?  ?If you receive a phone call telling you that your throat culture is negative, I still strongly recommend that you complete your entire prescription of antibiotics regardless of the result of your throat culture, particularly if you begin to feel better within the first 48 hours of therapy.  The throat culture we perform here at urgent care only tests for group A strep and not any of the other bacteria that can also cause bacterial pharyngitis.  Please follow-up with your primary care provider in the next week to ten days for repeat evaluation if you have not had complete  resolution of your symptoms.    ?  ?Cefdinir will also treat your recurrent pansinusitis which is likely bacterial at this point.  Please take 1 tablet twice daily for the next 14 days.  Please finish all 14 days worth to be sure that all of the bacteria in your sinus passages are eradicated.  Because we know that long courses of antibiotics can cause vaginal yeast infections, I have provided you with a prescription for Diflucan which treats vaginal yeast infections.  Please take the first tablet on day 4 of your antibiotics and take the second tablet 3 days after the first tablet. ?  ? ? ?Your symptoms and my physical exam findings are also concerning for exacerbation of your underlying allergies.  Your symptoms and my physical exam findings are concerning for reactive airway disease with an acute exacerbation.  Reactive airways disease is when your lower airway becomes inflamed due to viral infection, bacterial infection or allergy exposure.  It is important that you are consistent with taking allergy medications and inhalers exactly as prescribed.   ?  ?Methylprednisolone IM (Solu-Medrol):  To quickly address your significant respiratory inflammation, you were provided with an injection of methylprednisolone in the office today.  You should continue to feel the full benefit of the steroid for the next 4 to 6 hours.  ?  ?Methylprednisolone (Medrol Dosepak): This is a steroid that will significantly calm your upper and lower airways, please take one  row of tablets daily with your breakfast meal starting tomorrow morning until the prescription is complete.    ?  ?ProAir, Ventolin, Proventil (albuterol): This inhaled medication contains a short acting beta agonist bronchodilator.  This medication works on the smooth muscle that opens and constricts of your airways by relaxing the muscle.  The result of relaxation of the smooth muscle is increased air movement and improved work of breathing.  This is a short acting  medication that can be used every 4-6 hours as needed for increased work of breathing, shortness of breath, wheezing and excessive coughing.  I have provided you with a prescription for Albuterol HFA to be used with the spacer that you already have at home. ?  ?Allegra (fexofenadine): This is an excellent second-generation antihistamine that helps to reduce respiratory inflammatory response to environmental allergens.  This medication is not known to cause daytime sleepiness so it can be taken in the daytime.  If you find that it does make you sleepy, please feel free to take it at bedtime.  I provided you with a coupon to use at [facility] without your insurance in the event that your insurance does not cover Allegra or your insurance co-pay is higher than this coupon. ?  ?Flonase (fluticasone): This is a steroid nasal spray that you use once daily, 1 spray in each nare.  This medication does not work well if you decide to use it only used as you feel you need to, it works best used on a daily basis.  After 3 to 5 days of use, you will notice significant reduction of the inflammation and mucus production that is currently being caused by exposure to allergens, whether seasonal or environmental.  The most common side effect of this medication is nosebleeds.  If you experience a nosebleed, please discontinue use for 1 week, then feel free to resume.  I have provided you with a prescription but you can also purchase this medication over-the-counter if your insurance will not cover it. ?  ?Ipratropium (Atrovent): This is an excellent nasal decongestant spray that does not cause rebound congestion, please instill 2 sprays into each nare with each use.  Because nasal steroids can take several days before they begin to provide full benefit, I recommend that you use this spray in addition to the nasal steroid prescribed for you.  Please use it after you have used your nasal steroid and repeat up to 4 times daily as needed.   I have provided you with a prescription for this medication.    ? ? ?  ?If you find that you have not had significant relief of your symptoms in the next 7 to 10 days, please follow-up with your primary care provider ?  ?Thank you for visiting urgent care today.  We appreciate the opportunity to participate in your care. ?  ? ?  ? ?

## 2021-07-20 NOTE — ED Provider Notes (Signed)
[facility]    CSN: [id number] Arrival date & time: 07/20/21  1111    HISTORY   Chief Complaint  Patient presents with   Facial Pain   Nasal Congestion   Sore Throat   HPI Lori BRIMBERRY is a 56 y.o. female. Pt report feeling intense facial pressure that radiates to her eye teeth and causes a dizzy headache, postnasal drip, nonproductive cough and nasal congestion since Saturday.  Patient was seen in December 2022 here at this location for the same symptoms but adds that today is worse than before.  She states she has been taking DayQuil and NyQuil but this is not helping, states the supportive allergy and respiratory medication she was prescribed in December have run out.  Patient denies ear pain at this time.  Patient states she also has a very sore throat, has not been able to eat for 2 days because she has so much pain with swallowing.  Patient denies known sick contacts.  Patient states the antibiotic that she was provided with in December was very effective at relieving her acute sinus infection.  Rapid strep test today is negative.  The history is provided by the patient.  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   Graves disease 04/10/2019   Prediabetes 10/18/2018   Tinea pedis 07/31/2014   Screening for HIV (human immunodeficiency virus) 07/31/2014   Cramp of limb 01/13/2011   Sarcoidosis of lung (HCC) 12/30/2010   Rhinitis, allergic 11/29/2010   Urticaria, 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    Home Medications    Prior to Admission medications   Medication Sig Start Date End Date Taking? Authorizing Provider  famotidine (PEPCID) 20 MG tablet Take 1 tablet (20 mg total) by mouth daily. 08/19/20   Arnaldo Natal, NP  gabapentin (NEURONTIN) 100 MG capsule Take 3 capsules (300 mg total) by mouth at bedtime. Take as needed for hip pain. 12/03/19   Bing Neighbors, FNP  levothyroxine (SYNTHROID) 88 MCG tablet Take 1 tablet (88 mcg total) by mouth as directed. Half a tablet on sundays and 1 tablet the rest of the week 01/07/21   Shamleffer, Konrad Dolores, MD  nicotine (NICODERM CQ - DOSED IN MG/24 HOURS) 14 mg/24hr patch Place 1 patch (14 mg total) onto the skin daily. 10/17/18   Marcine Matar, MD  pantoprazole (PROTONIX) 40 MG tablet Take 1 tablet (40 mg total) by mouth 2 (two) times daily. 10/28/20   Tressia Danas, MD   Family History Family History  Problem Relation Age of Onset   Hypertension Mother    Heart attack Mother    Hypertension Brother    Hypertension Father    Allergies Father    Allergies  Other        children   Breast cancer Maternal Aunt    Breast cancer Maternal Aunt    Social History Social History   Tobacco Use   Smoking status: Some Days    Packs/day: 0.50    Years: 32.00    Pack years: 16.00    Types: Cigarettes   Smokeless tobacco: Never  Vaping Use   Vaping Use: Never used  Substance Use Topics   Alcohol use: Yes    Comment: occasional beer   Drug use: No   Allergies   Patient has no known allergies.  Review of Systems Review of Systems Pertinent findings noted in history of present illness.   Physical Exam Triage Vital Signs ED Triage Vitals  Enc Vitals Group     BP 02/25/21 0827 (!) 147/82      Pulse Rate 02/25/21 0827 72     Resp 02/25/21 0827 18     Temp 02/25/21 0827 98.3 F (36.8 C)     Temp Source 02/25/21 0827 Oral     SpO2 02/25/21 0827 98 %     Weight --      Height --      Head Circumference --      Peak Flow --      Pain Score 02/25/21 0826 5     Pain Loc --      Pain Edu? --      Excl. in GC? --   No data found.  Updated Vital Signs BP 124/86 (BP Location: Right Arm)   Pulse 85   Temp 97.9 F (36.6 C) (Oral)   Resp 18   LMP 04/23/2011   SpO2 96%   Physical Exam Vitals and nursing note reviewed.  Constitutional:      General: She is not in acute distress.    Appearance: Normal appearance. She is well-developed. She is ill-appearing. She is not toxic-appearing.  HENT:     Head: Normocephalic and atraumatic.     Salivary Glands: Right salivary gland is diffusely enlarged and tender. Left salivary gland is diffusely enlarged and tender.     Right Ear: Hearing, ear canal and external ear normal. No decreased hearing noted. No drainage. A middle ear effusion is present. There is no impacted cerumen. Tympanic membrane is bulging. Tympanic membrane is not injected or erythematous.     Left Ear: Hearing, ear canal and external ear normal. No decreased hearing noted. No drainage. A middle ear effusion is present. There is no impacted cerumen. Tympanic membrane is bulging. Tympanic membrane is not injected or erythematous.     Ears:     Comments: Bilateral EACs normal, both TMs bulging with clear fluid    Nose: Mucosal edema, congestion and rhinorrhea present. No nasal deformity, septal deviation, signs of injury or nasal tenderness. Rhinorrhea is clear.     Right Nostril: Occlusion present. No foreign body, epistaxis or septal hematoma.     Left Nostril: Occlusion present. No foreign body, epistaxis or septal hematoma.     Right Turbinates: Enlarged, swollen and pale.     Left Turbinates: Enlarged, swollen and pale.     Right Sinus: Maxillary sinus tenderness and  frontal sinus tenderness present.     Left Sinus: Maxillary sinus tenderness and frontal sinus tenderness present.     Mouth/Throat:     Lips: Pink. No lesions.     Mouth: Mucous membranes are moist. No oral lesions or angioedema.     Dentition: No gingival  swelling.     Tongue: No lesions.     Palate: No mass.     Pharynx: Uvula midline. Pharyngeal swelling, oropharyngeal exudate and posterior oropharyngeal erythema present. No uvula swelling.     Tonsils: Tonsillar exudate present. 2+ on the right. 2+ on the left.     Comments: Postnasal drip Eyes:     General: Lids are normal. Vision grossly intact. Allergic shiner present.        Right eye: No discharge.        Left eye: No discharge.     Extraocular Movements: Extraocular movements intact.     Conjunctiva/sclera: Conjunctivae normal.     Right eye: Right conjunctiva is not injected. No exudate.    Left eye: Left conjunctiva is not injected. No exudate.    Pupils: Pupils are equal, round, and reactive to light.  Neck:     Thyroid: No thyroid mass, thyromegaly or thyroid tenderness.     Trachea: Phonation normal. Tracheal tenderness present. No abnormal tracheal secretions or tracheal deviation.     Comments: Voice is muffled Cardiovascular:     Rate and Rhythm: Normal rate and regular rhythm.     Pulses: Normal pulses.     Heart sounds: Normal heart sounds, S1 normal and S2 normal. Heart sounds not distant. No murmur heard.   No friction rub. No gallop.  Pulmonary:     Effort: Pulmonary effort is normal. No tachypnea, bradypnea, accessory muscle usage, prolonged expiration, respiratory distress or retractions.     Breath sounds: No stridor, decreased air movement or transmitted upper airway sounds. Examination of the right-upper field reveals decreased breath sounds. Examination of the left-upper field reveals decreased breath sounds. Examination of the right-middle field reveals decreased breath sounds. Examination of the  left-middle field reveals decreased breath sounds. Examination of the right-lower field reveals decreased breath sounds. Examination of the left-lower field reveals decreased breath sounds. Decreased breath sounds present. No wheezing, rhonchi or rales.  Chest:     Chest wall: No tenderness.  Abdominal:     General: Bowel sounds are normal.     Palpations: Abdomen is soft.     Tenderness: There is generalized abdominal tenderness. There is no right CVA tenderness, left CVA tenderness or rebound. Negative signs include Murphy's sign.     Hernia: No hernia is present.  Musculoskeletal:        General: No tenderness. Normal range of motion.     Cervical back: Full passive range of motion without pain, normal range of motion and neck supple. Normal range of motion.     Right lower leg: No edema.     Left lower leg: No edema.  Lymphadenopathy:     Cervical: Cervical adenopathy present.     Right cervical: Superficial cervical adenopathy and posterior cervical adenopathy present.     Left cervical: Superficial cervical adenopathy and posterior cervical adenopathy present.  Skin:    General: Skin is warm and dry.     Findings: No erythema, lesion or rash.  Neurological:     General: No focal deficit present.     Mental Status: She is alert and oriented to person, place, and time. Mental status is at baseline.  Psychiatric:        Mood and Affect: Mood normal.        Behavior: Behavior normal.        Thought Content: Thought content normal.        Judgment: Judgment normal.    Visual  Acuity Right Eye Distance:   Left Eye Distance:   Bilateral Distance:    Right Eye Near:   Left Eye Near:    Bilateral Near:     UC Couse / Diagnostics / Procedures:    EKG  Radiology No results found.  Procedures Procedures (including critical care time)  UC Diagnoses / Final Clinical Impressions(s)   I have reviewed the triage vital signs and the nursing notes.  Pertinent labs & imaging  results that were available during my care of the patient were reviewed by me and considered in my medical decision making (see chart for details).   Final diagnoses:  Acute tonsillitis, unspecified etiology  Acute pharyngitis, unspecified etiology  Acute recurrent pansinusitis  Repeat Omnicef for tonsillitis that appears clinically bacterial as well as recurrent pansinusitis.  Patient provided prescription for Diflucan given the inevitable vaginal yeast infection after 10 days of antibiotics.  Patient provided with prescriptions for fexofenadine and Flonase as well as Atrovent for treatment of upper airway inflammation.  Patient also provided with a 3-day course of oral steroids and albuterol to improve her work of breathing.  Return precautions advised.  ED Prescriptions     Medication Sig Dispense Auth. Provider   cefdinir (OMNICEF) 300 MG capsule Take 1 capsule (300 mg total) by mouth 2 (two) times daily for 14 days. 28 capsule Theadora Rama Scales, PA-C   ipratropium (ATROVENT) 0.06 % nasal spray Place 2 sprays into both nostrils 4 (four) times daily. As needed for nasal congestion, runny nose 15 mL Theadora Rama Scales, PA-C   fluticasone (FLONASE) 50 MCG/ACT nasal spray Place 2 sprays into both nostrils daily. 32 mL Theadora Rama Scales, PA-C   albuterol (VENTOLIN HFA) 108 (90 Base) MCG/ACT inhaler Inhale 2 puffs into the lungs every 6 (six) hours as needed for wheezing or shortness of breath (Cough). 18 g Theadora Rama Scales, PA-C   fexofenadine (ALLEGRA) 180 MG tablet Take 1 tablet (180 mg total) by mouth daily. 180 tablet Theadora Rama Scales, PA-C   methylPREDNISolone (MEDROL) 4 MG tablet Take 4 tablets (16 mg total) by mouth daily for 1 day, THEN 3 tablets (12 mg total) daily for 1 day, THEN 2 tablets (8 mg total) daily for 1 day. 9 tablet Theadora Rama Scales, PA-C   fluconazole (DIFLUCAN) 150 MG tablet Take 1 tablet on day 4 of antibiotics.  Take second tablet 3 days later.  2 tablet Theadora Rama Scales, PA-C      PDMP not reviewed this encounter.  Pending results:  Labs Reviewed  POCT RAPID STREP A (OFFICE)    Medications Ordered in UC: Medications  methylPREDNISolone sodium succinate (SOLU-MEDROL) 125 mg/2 mL injection 125 mg (125 mg Intramuscular Given 07/20/21 1239)    Disposition Upon Discharge:  Condition: stable for discharge home Home: take medications as prescribed; routine discharge instructions as discussed; follow up as advised.  Patient presented with an acute illness with associated systemic symptoms and significant discomfort requiring urgent management. In my opinion, this is a condition that a prudent lay person (someone who possesses an average knowledge of health and medicine) may potentially expect to result in complications if not addressed urgently such as respiratory distress, impairment of bodily function or dysfunction of bodily organs.   Routine symptom specific, illness specific and/or disease specific instructions were discussed with the patient and/or caregiver at length.   As such, the patient has been evaluated and assessed, work-up was performed and treatment was provided in alignment with urgent care protocols  and evidence based medicine.  Patient/parent/caregiver has been advised that the patient may require follow up for further testing and treatment if the symptoms continue in spite of treatment, as clinically indicated and appropriate.  If the patient was tested for COVID-19, Influenza and/or RSV, then the patient/parent/guardian was advised to isolate at home pending the results of his/her diagnostic coronavirus test and potentially longer if theyre positive. I have also advised pt that if his/her COVID-19 test returns positive, it's recommended to self-isolate for at least 10 days after symptoms first appeared AND until fever-free for 24 hours without fever reducer AND other symptoms have improved or resolved. Discussed  self-isolation recommendations as well as instructions for household member/close contacts as per the [facility] and [place] DHHS, and also gave patient the COVID packet with this information.  Patient/parent/caregiver has been advised to return to the [facility] or PCP in 3-5 days if no better; to PCP or the Emergency Department if new signs and symptoms develop, or if the current signs or symptoms continue to change or worsen for further workup, evaluation and treatment as clinically indicated and appropriate  The patient will follow up with their current PCP if and as advised. If the patient does not currently have a PCP we will assist them in obtaining one.   The patient may need specialty follow up if the symptoms continue, in spite of conservative treatment and management, for further workup, evaluation, consultation and treatment as clinically indicated and appropriate.  Patient/parent/caregiver verbalized understanding and agreement of plan as discussed.  All questions were addressed during visit.  Please see discharge instructions below for further details of plan.  Discharge Instructions:   Discharge Instructions      Your strep test today is negative.  Throat culture will be performed per our protocol.  The result of your throat culture will be posted to your MyChart once it is complete, this typically takes 3 to 5 days.  If there is a positive result, you will be contacted by phone and antibiotics will be prescribed for you.   Based on my physical exam today and the history that you provided, I believe that you have bacterial pharyngitis.  Bacterial pharyngitis is most commonly caused by bacteria called group A Streptococcus but there are many other culprits.   The rapid strep test that we performed in the office only catches 40% of known cases of group A streptococcal pharyngitis.  Additionally, and unfortunately, the throat culture that we perform here in the urgent care only evaluates for group A strep  and not any of the other bacteria  that are known to cause bacterial pharyngitis.      Because you have significant swelling of both tonsils, significant redness of both tonsils and white patches on both tonsils, I have prescribed Cefdinir 300 mg twice daily for 10 days to treat you for bacterial pharyngitis.  Cefdinir covers group A strep along with other known causative organisms.  Please take this antibiotic as prescribed and do not skip any doses.  Once you have been on antibiotics for a full 24 hours, you are no longer considered contagious.       If you receive a phone call telling you that your throat culture is negative, I still strongly recommend that you complete your entire prescription of antibiotics regardless of the result of your throat culture, particularly if you begin to feel better within the first 48 hours of therapy.  The throat culture we perform here at urgent care  only tests for group A strep and not any of the other bacteria that can also cause bacterial pharyngitis.  Please follow-up with your primary care provider in the next week to ten days for repeat evaluation if you have not had complete resolution of your symptoms.      Cefdinir will also treat your recurrent pansinusitis which is likely bacterial at this point.  Please take 1 tablet twice daily for the next 14 days.  Please finish all 14 days worth to be sure that all of the bacteria in your sinus passages are eradicated.  Because we know that long courses of antibiotics can cause vaginal yeast infections, I have provided you with a prescription for Diflucan which treats vaginal yeast infections.  Please take the first tablet on day 4 of your antibiotics and take the second tablet 3 days after the first tablet.     Your symptoms and my physical exam findings are also concerning for exacerbation of your underlying allergies.  Your symptoms and my physical exam findings are concerning for reactive airway disease with an  acute exacerbation.  Reactive airways disease is when your lower airway becomes inflamed due to viral infection, bacterial infection or allergy exposure.  It is important that you are consistent with taking allergy medications and inhalers exactly as prescribed.     Methylprednisolone IM (Solu-Medrol):  To quickly address your significant respiratory inflammation, you were provided with an injection of methylprednisolone in the office today.  You should continue to feel the full benefit of the steroid for the next 4 to 6 hours.    Methylprednisolone (Medrol Dosepak): This is a steroid that will significantly calm your upper and lower airways, please take one row of tablets daily with your breakfast meal starting tomorrow morning until the prescription is complete.      ProAir, Ventolin, Proventil (albuterol): This inhaled medication contains a short acting beta agonist bronchodilator.  This medication works on the smooth muscle that opens and constricts of your airways by relaxing the muscle.  The result of relaxation of the smooth muscle is increased air movement and improved work of breathing.  This is a short acting medication that can be used every 4-6 hours as needed for increased work of breathing, shortness of breath, wheezing and excessive coughing.  I have provided you with a prescription for Albuterol HFA to be used with the spacer that you already have at home.   Allegra (fexofenadine): This is an excellent second-generation antihistamine that helps to reduce respiratory inflammatory response to environmental allergens.  This medication is not known to cause daytime sleepiness so it can be taken in the daytime.  If you find that it does make you sleepy, please feel free to take it at bedtime.  I provided you with a coupon to use at [facility] without your insurance in the event that your insurance does not cover Allegra or your insurance co-pay is higher than this coupon.   Flonase (fluticasone):  This is a steroid nasal spray that you use once daily, 1 spray in each nare.  This medication does not work well if you decide to use it only used as you feel you need to, it works best used on a daily basis.  After 3 to 5 days of use, you will notice significant reduction of the inflammation and mucus production that is currently being caused by exposure to allergens, whether seasonal or environmental.  The most common side effect of this medication is nosebleeds.  If you experience a nosebleed, please discontinue use for 1 week, then feel free to resume.  I have provided you with a prescription but you can also purchase this medication over-the-counter if your insurance will not cover it.   Ipratropium (Atrovent): This is an excellent nasal decongestant spray that does not cause rebound congestion, please instill 2 sprays into each nare with each use.  Because nasal steroids can take several days before they begin to provide full benefit, I recommend that you use this spray in addition to the nasal steroid prescribed for you.  Please use it after you have used your nasal steroid and repeat up to 4 times daily as needed.  I have provided you with a prescription for this medication.        If you find that you have not had significant relief of your symptoms in the next 7 to 10 days, please follow-up with your primary care provider   Thank you for visiting urgent care today.  We appreciate the opportunity to participate in your care.          This office note has been dictated using Teaching laboratory technician.  Unfortunately, and despite my best efforts, this method of dictation can sometimes lead to occasional typographical or grammatical errors.  I apologize in advance if this occurs.     Theadora Rama Scales, PA-C 07/20/21 1257

## 2021-07-20 NOTE — ED Triage Notes (Addendum)
Pt report feeling facial pressure, sore throat, headache and congestion since Saturday, she states nothing is helping. ?

## 2021-07-23 ENCOUNTER — Other Ambulatory Visit: Payer: Self-pay

## 2021-07-23 ENCOUNTER — Emergency Department (HOSPITAL_BASED_OUTPATIENT_CLINIC_OR_DEPARTMENT_OTHER): Payer: 59

## 2021-07-23 ENCOUNTER — Encounter (HOSPITAL_BASED_OUTPATIENT_CLINIC_OR_DEPARTMENT_OTHER): Payer: Self-pay

## 2021-07-23 ENCOUNTER — Emergency Department (HOSPITAL_BASED_OUTPATIENT_CLINIC_OR_DEPARTMENT_OTHER)
Admission: EM | Admit: 2021-07-23 | Discharge: 2021-07-24 | Disposition: A | Discharge status: Home / Self Care | Payer: 59 | Attending: Emergency Medicine | Admitting: Emergency Medicine

## 2021-07-23 DIAGNOSIS — M25522 Pain in left elbow: Secondary | ICD-10-CM | POA: Insufficient documentation

## 2021-07-23 DIAGNOSIS — M542 Cervicalgia: Secondary | ICD-10-CM | POA: Insufficient documentation

## 2021-07-23 DIAGNOSIS — M25521 Pain in right elbow: Secondary | ICD-10-CM | POA: Diagnosis not present

## 2021-07-23 DIAGNOSIS — M25512 Pain in left shoulder: Secondary | ICD-10-CM | POA: Diagnosis not present

## 2021-07-23 DIAGNOSIS — Z20822 Contact with and (suspected) exposure to covid-19: Secondary | ICD-10-CM | POA: Diagnosis not present

## 2021-07-23 DIAGNOSIS — M25531 Pain in right wrist: Secondary | ICD-10-CM | POA: Insufficient documentation

## 2021-07-23 DIAGNOSIS — R102 Pelvic and perineal pain: Secondary | ICD-10-CM | POA: Diagnosis not present

## 2021-07-23 DIAGNOSIS — R079 Chest pain, unspecified: Secondary | ICD-10-CM | POA: Insufficient documentation

## 2021-07-23 DIAGNOSIS — M79642 Pain in left hand: Secondary | ICD-10-CM | POA: Diagnosis not present

## 2021-07-23 DIAGNOSIS — M25532 Pain in left wrist: Secondary | ICD-10-CM | POA: Diagnosis not present

## 2021-07-23 DIAGNOSIS — M79641 Pain in right hand: Secondary | ICD-10-CM | POA: Diagnosis not present

## 2021-07-23 DIAGNOSIS — M25511 Pain in right shoulder: Secondary | ICD-10-CM | POA: Insufficient documentation

## 2021-07-23 DIAGNOSIS — M2550 Pain in unspecified joint: Secondary | ICD-10-CM

## 2021-07-23 LAB — CBC WITH DIFFERENTIAL/PLATELET
Abs Immature Granulocytes: 0.15 10*3/uL — ABNORMAL HIGH (ref 0.00–0.07)
Basophils Absolute: 0 10*3/uL (ref 0.0–0.1)
Basophils Relative: 0 %
Eosinophils Absolute: 0.2 10*3/uL (ref 0.0–0.5)
Eosinophils Relative: 2 %
HCT: 37.3 % (ref 36.0–46.0)
Hemoglobin: 11.8 g/dL — ABNORMAL LOW (ref 12.0–15.0)
Immature Granulocytes: 2 %
Lymphocytes Relative: 28 %
Lymphs Abs: 2.9 10*3/uL (ref 0.7–4.0)
MCH: 27.7 pg (ref 26.0–34.0)
MCHC: 31.6 g/dL (ref 30.0–36.0)
MCV: 87.6 fL (ref 80.0–100.0)
Monocytes Absolute: 0.9 10*3/uL (ref 0.1–1.0)
Monocytes Relative: 8 %
Neutro Abs: 6.2 10*3/uL (ref 1.7–7.7)
Neutrophils Relative %: 60 %
Platelets: 357 10*3/uL (ref 150–400)
RBC: 4.26 MIL/uL (ref 3.87–5.11)
RDW: 13.7 % (ref 11.5–15.5)
WBC: 10.2 10*3/uL (ref 4.0–10.5)
nRBC: 0 % (ref 0.0–0.2)

## 2021-07-23 LAB — RESP PANEL BY RT-PCR (FLU A&B, COVID) ARPGX2
Influenza A by PCR: NEGATIVE
Influenza B by PCR: NEGATIVE
SARS Coronavirus 2 by RT PCR: NEGATIVE

## 2021-07-23 LAB — BASIC METABOLIC PANEL
Anion gap: 9 (ref 5–15)
BUN: 19 mg/dL (ref 6–20)
CO2: 29 mmol/L (ref 22–32)
Calcium: 9.9 mg/dL (ref 8.9–10.3)
Chloride: 103 mmol/L (ref 98–111)
Creatinine, Ser: 0.58 mg/dL (ref 0.44–1.00)
GFR, Estimated: >60 mL/min (ref >60)
Glucose, Bld: 94 mg/dL (ref 70–99)
Potassium: 3.6 mmol/L (ref 3.5–5.1)
Sodium: 141 mmol/L (ref 135–145)

## 2021-07-23 LAB — TROPONIN I (HIGH SENSITIVITY): Troponin I (High Sensitivity): 2 ng/L (ref <18)

## 2021-07-23 LAB — CULTURE, GROUP A STREP (THRC)

## 2021-07-23 MED ORDER — IBUPROFEN 600 MG PO TABS: 600.0000 mg | ORAL_TABLET | Freq: Four times a day (QID) | ORAL | 0 refills | Status: DC | PRN

## 2021-07-23 MED ORDER — HYDROCODONE-ACETAMINOPHEN 5-325 MG PO TABS: 1.0000 | ORAL_TABLET | Freq: Four times a day (QID) | ORAL | 0 refills | Status: DC | PRN

## 2021-07-23 MED ORDER — SODIUM CHLORIDE 0.9 % IV BOLUS
1000.0000 mL | Freq: Once | INTRAVENOUS | Status: AC
  Administered 2021-07-23: 1000 mL via INTRAVENOUS

## 2021-07-23 MED ORDER — HYDROCODONE-ACETAMINOPHEN 5-325 MG PO TABS
1.0000 | ORAL_TABLET | Freq: Once | ORAL | Status: AC
  Administered 2021-07-23: 1 via ORAL
  Filled 2021-07-23: qty 1

## 2021-07-23 NOTE — ED Provider Notes (Incomplete)
?[facility] ?Provider Note ? ? ?CSN: [id number] ?Arrival date & time: 07/23/21  1909 ? ?  ? ?History ?{Add pertinent medical, surgical, social history, OB history to HPI:1} ?Chief Complaint  ?Patient presents with  ? Generalized Body Pains   ? ? ?[name] is a 56 y.o. female. ? ?HPI ? ?  ? ?Home Medications ?Prior to Admission medications   ?Medication Sig Start Date End Date Taking? Authorizing Provider  ?albuterol (VENTOLIN HFA) 108 (90 Base) MCG/ACT inhaler Inhale 2 puffs into the lungs every 6 (six) hours as needed for wheezing or shortness of breath (Cough). 07/20/21   Lynden Oxford Scales, PA-C  ?cefdinir (OMNICEF) 300 MG capsule Take 1 capsule (300 mg total) by mouth 2 (two) times daily for 14 days. 07/20/21 08/03/21  Lynden Oxford Scales, PA-C  ?famotidine (PEPCID) 20 MG tablet Take 1 tablet (20 mg total) by mouth daily. 08/19/20   Noralyn Pick, NP  ?fexofenadine (ALLEGRA) 180 MG tablet Take 1 tablet (180 mg total) by mouth daily. 07/20/21 01/16/22  Lynden Oxford Scales, PA-C  ?fluconazole (DIFLUCAN) 150 MG tablet Take 1 tablet on day 4 of antibiotics.  Take second tablet 3 days later. 07/20/21   Lynden Oxford Scales, PA-C  ?fluticasone (FLONASE) 50 MCG/ACT nasal spray Place 2 sprays into both nostrils daily. 07/20/21   Lynden Oxford Scales, PA-C  ?gabapentin (NEURONTIN) 100 MG capsule Take 3 capsules (300 mg total) by mouth at bedtime. Take as needed for hip pain. 12/03/19   Scot Jun, FNP  ?ipratropium (ATROVENT) 0.06 % nasal spray Place 2 sprays into both nostrils 4 (four) times daily. As needed for nasal congestion, runny nose 07/20/21   Lynden Oxford Scales, PA-C  ?levothyroxine (SYNTHROID) 88 MCG tablet Take 1 tablet (88 mcg total) by mouth as directed. Half a tablet on sundays and 1 tablet the rest of the week 01/07/21   Shamleffer, Melanie Crazier, MD  ?methylPREDNISolone (MEDROL) 4 MG tablet Take 4 tablets (16 mg total) by mouth daily for 1 day,  THEN 3 tablets (12 mg total) daily for 1 day, THEN 2 tablets (8 mg total) daily for 1 day. 07/20/21 07/23/21  Lynden Oxford Scales, PA-C  ?nicotine (NICODERM CQ - DOSED IN MG/24 HOURS) 14 mg/24hr patch Place 1 patch (14 mg total) onto the skin daily. 10/17/18   Ladell Pier, MD  ?pantoprazole (PROTONIX) 40 MG tablet Take 1 tablet (40 mg total) by mouth 2 (two) times daily. 10/28/20   Thornton Park, MD  ?   ? ?Allergies    ?Patient has no known allergies.   ? ?Review of Systems   ?Review of Systems ? ?Physical Exam ?Updated Vital Signs ?BP (!) 150/99 (BP Location: Right Arm)   Pulse 87   Temp 98.4 ?F (36.9 ?C)   Resp 16   Ht '5\' 7"'$  (1.702 m)   Wt 59.9 kg   LMP 04/23/2011   SpO2 99%   BMI 20.67 kg/m?  ?Physical Exam ? ?ED Results / Procedures / Treatments   ?Labs ?(all labs ordered are listed, but only abnormal results are displayed) ?Labs Reviewed  ?RESP PANEL BY RT-PCR (FLU A&B, COVID) ARPGX2  ?CBC WITH DIFFERENTIAL/PLATELET  ?BASIC METABOLIC PANEL  ?TROPONIN I (HIGH SENSITIVITY)  ? ? ?EKG ?None ? ?Radiology ?No results found. ? ?Procedures ?Procedures  ?{Document cardiac monitor, telemetry assessment procedure when appropriate:1} ? ?Medications Ordered in ED ?Medications  ?sodium chloride 0.9 % bolus 1,000 mL (has no administration in time range)  ? ? ?  ED Course/ Medical Decision Making/ A&P ?  ?                        ?Medical Decision Making ?Amount and/or Complexity of Data Reviewed ?Labs: ordered. ?Radiology: ordered. ? ? ?*** ? ?{Document critical care time when appropriate:1} ?{Document review of labs and clinical decision tools ie heart score, Chads2Vasc2 etc:1}  ?{Document your independent review of radiology images, and any outside records:1} ?{Document your discussion with family members, caretakers, and with consultants:1} ?{Document social determinants of health affecting pt's care:1} ?{Document your decision making why or why not admission, treatments were needed:1} ?Final Clinical  Impression(s) / ED Diagnoses ?Final diagnoses:  ?None  ? ? ?Rx / DC Orders ?ED Discharge Orders   ? ? None  ? ?  ? ? ?

## 2021-07-23 NOTE — ED Provider Notes (Addendum)
?[facility] ?Provider Note ? ? ?CSN: [id number] ?Arrival date & time: 07/23/21  1909 ? ?  ? ?History ? ?Chief Complaint  ?Patient presents with  ? Generalized Body Pains   ? ? ?[name] is a 56 y.o. female. ? ?Patient is a 56 year old female with past medical history of Graves' disease presenting for pain all over.  Patient admits to joint pain including the shoulders, elbows, wrist, hands, neck, and pelvis.  Family history of lupus.  She has never been formally diagnosed herself.  Also has concerns for chest pain that has been developed over the last couple of days.  Denies any radiation, nausea, vomiting, or diaphoresis. ? ?The history is provided by the patient. No language interpreter was used.  ? ?  ? ?Home Medications ?Prior to Admission medications   ?Medication Sig Start Date End Date Taking? Authorizing Provider  ?albuterol (VENTOLIN HFA) 108 (90 Base) MCG/ACT inhaler Inhale 2 puffs into the lungs every 6 (six) hours as needed for wheezing or shortness of breath (Cough). 07/20/21   Lynden Oxford Scales, PA-C  ?cefdinir (OMNICEF) 300 MG capsule Take 1 capsule (300 mg total) by mouth 2 (two) times daily for 14 days. 07/20/21 08/03/21  Lynden Oxford Scales, PA-C  ?famotidine (PEPCID) 20 MG tablet Take 1 tablet (20 mg total) by mouth daily. 08/19/20   Noralyn Pick, NP  ?fexofenadine (ALLEGRA) 180 MG tablet Take 1 tablet (180 mg total) by mouth daily. 07/20/21 01/16/22  Lynden Oxford Scales, PA-C  ?fluconazole (DIFLUCAN) 150 MG tablet Take 1 tablet on day 4 of antibiotics.  Take second tablet 3 days later. 07/20/21   Lynden Oxford Scales, PA-C  ?fluticasone (FLONASE) 50 MCG/ACT nasal spray Place 2 sprays into both nostrils daily. 07/20/21   Lynden Oxford Scales, PA-C  ?gabapentin (NEURONTIN) 100 MG capsule Take 3 capsules (300 mg total) by mouth at bedtime. Take as needed for hip pain. 12/03/19   Scot Jun, FNP  ?HYDROcodone-acetaminophen (NORCO) 5-325 MG  tablet Take 1 tablet by mouth every 6 (six) hours as needed for moderate pain. 9/44/96   Lianne Cure, DO  ?ibuprofen (ADVIL) 600 MG tablet Take 1 tablet (600 mg total) by mouth every 6 (six) hours as needed. 7/59/16   Campbell Stall P, DO  ?ipratropium (ATROVENT) 0.06 % nasal spray Place 2 sprays into both nostrils 4 (four) times daily. As needed for nasal congestion, runny nose 07/20/21   Lynden Oxford Scales, PA-C  ?levothyroxine (SYNTHROID) 88 MCG tablet Take 1 tablet (88 mcg total) by mouth as directed. Half a tablet on sundays and 1 tablet the rest of the week 01/07/21   Shamleffer, Melanie Crazier, MD  ?nicotine (NICODERM CQ - DOSED IN MG/24 HOURS) 14 mg/24hr patch Place 1 patch (14 mg total) onto the skin daily. 10/17/18   Ladell Pier, MD  ?pantoprazole (PROTONIX) 40 MG tablet Take 1 tablet (40 mg total) by mouth 2 (two) times daily. 10/28/20   Thornton Park, MD  ?   ? ?Allergies    ?Patient has no known allergies.   ? ?Review of Systems   ?Review of Systems  ?Constitutional:  Negative for chills and fever.  ?HENT:  Negative for ear pain and sore throat.   ?Eyes:  Negative for pain and visual disturbance.  ?Respiratory:  Negative for cough and shortness of breath.   ?Cardiovascular:  Positive for chest pain. Negative for palpitations.  ?Gastrointestinal:  Negative for abdominal pain and vomiting.  ?Genitourinary:  Negative for  dysuria and hematuria.  ?Musculoskeletal:  Positive for arthralgias and neck pain. Negative for back pain.  ?Skin:  Negative for color change and rash.  ?Neurological:  Negative for seizures and syncope.  ?All other systems reviewed and are negative. ? ?Physical Exam ?Updated Vital Signs ?BP (!) 155/100   Pulse 68   Temp 98.4 ?F (36.9 ?C)   Resp 17   Ht '5\' 7"'$  (1.702 m)   Wt 59.9 kg   LMP 04/23/2011   SpO2 96%   BMI 20.67 kg/m?  ?Physical Exam ?Vitals and nursing note reviewed.  ?Constitutional:   ?   General: She is not in acute distress. ?   Appearance: She is  well-developed.  ?HENT:  ?   Head: Normocephalic and atraumatic.  ?Eyes:  ?   Conjunctiva/sclera: Conjunctivae normal.  ?Cardiovascular:  ?   Rate and Rhythm: Normal rate and regular rhythm.  ?   Heart sounds: No murmur heard. ?Pulmonary:  ?   Effort: Pulmonary effort is normal. No respiratory distress.  ?   Breath sounds: Normal breath sounds.  ?Abdominal:  ?   Palpations: Abdomen is soft.  ?   Tenderness: There is no abdominal tenderness.  ?Musculoskeletal:     ?   General: No swelling.  ?   Cervical back: Neck supple.  ?Skin: ?   General: Skin is warm and dry.  ?   Capillary Refill: Capillary refill takes less than 2 seconds.  ?Neurological:  ?   Mental Status: She is alert.  ?Psychiatric:     ?   Mood and Affect: Mood normal.  ? ? ?ED Results / Procedures / Treatments   ?Labs ?(all labs ordered are listed, but only abnormal results are displayed) ?Labs Reviewed  ?CBC WITH DIFFERENTIAL/PLATELET - Abnormal; Notable for the following components:  ?    Result Value  ? Hemoglobin 11.8 (*)   ? Abs Immature Granulocytes 0.15 (*)   ? All other components within normal limits  ?RESP PANEL BY RT-PCR (FLU A&B, COVID) ARPGX2  ?BASIC METABOLIC PANEL  ?TROPONIN I (HIGH SENSITIVITY)  ? ? ?EKG ?EKG Interpretation ? ?Date/Time:  Saturday July 23 2021 23:41:56 EDT ?Ventricular Rate:  69 ?PR Interval:  201 ?QRS Duration: 95 ?QT Interval:  438 ?QTC Calculation: 470 ?R Axis:   85 ?Text Interpretation: Sinus rhythm Probable left ventricular hypertrophy Minimal ST elevation, inferior leads Confirmed by Campbell Stall ([id number]) on 05/19/4172 11:46:25 PM ? ?Radiology ?DG Chest Portable 1 View ? ?Result Date: 07/23/2021 ?CLINICAL DATA:  Chest pain EXAM: PORTABLE CHEST 1 VIEW COMPARISON:  Chest radiograph dated 10/13/2020. FINDINGS: The heart size is enlarged. Both lungs are clear. Pleural bulla are noted bilaterally. The visualized skeletal structures are unremarkable. IMPRESSION: No active disease. Electronically Signed   By: Zerita Boers  M.D.   On: 07/23/2021 20:21   ? ?Procedures ?Procedures  ? ? ?Medications Ordered in ED ?Medications  ?sodium chloride 0.9 % bolus 1,000 mL (0 mLs Intravenous Stopped 07/23/21 2206)  ?HYDROcodone-acetaminophen (NORCO/VICODIN) 5-325 MG per tablet 1 tablet (1 tablet Oral Given 07/23/21 2011)  ? ? ?ED Course/ Medical Decision Making/ A&P ?  ?                        ?Medical Decision Making ?Amount and/or Complexity of Data Reviewed ?Labs: ordered. ?Radiology: ordered. ?ECG/medicine tests: ordered. ? ?Risk ?Prescription drug management. ? ? ?10:33 AM ? ?56 year old female with past medical history of Graves' disease presenting for arthralgias.  On  exam patient is alert and oriented x3, no acute distress, afebrile, stable vital signs. ? ? ?The patient's chest pain is not suggestive of pulmonary embolus, cardiac ischemia, aortic dissection, pericarditis, myocarditis, pulmonary embolism, pneumothorax, pneumonia, Zoster, or esophageal perforation, or other serious etiology.  Historically not abrupt in onset, tearing or ripping, pulses symmetric. EKG nonspecific for ischemia/infarction. No dysrhythmias, brugada, WPW, prolonged QT noted. CXR reviewed and WNL. Troponin negative x2.  Labs without demonstration of acute pathology unless otherwise noted above. Low HEART Score: 0-3 points (0.9-1.7% risk of MACE).] Given the extremely low risk of these diagnoses further testing and evaluation for these possibilities does not appear to be indicated at this time. Patient in no distress and overall condition improved here in the ED.  ? ? ?Patient has plaints of arthralgias in multiple joints.  Family history of lupus.  Given outpatient recommendations for follow-up with rheumatology. ? ? ?Detailed discussions were had with the patient regarding current findings, and need for close f/u with PCP or on call doctor. The patient has been instructed to return immediately if the symptoms worsen in any way for re-evaluation. Patient verbalized  understanding and is in agreement with current care plan. All questions answered prior to discharge.  ? ? ? ? ? ? ? ?Final Clinical Impression(s) / ED Diagnoses ?Final diagnoses:  ?Arthralgia, unspecified joint  ?Chest p

## 2021-07-23 NOTE — ED Triage Notes (Signed)
Pt arrive with c/o generalized body pain. Per pt, she is having body pains in her upper and lower back, neck, and hips.  ?

## 2021-07-26 ENCOUNTER — Telehealth: Payer: Self-pay | Admitting: Internal Medicine

## 2021-07-26 NOTE — Telephone Encounter (Signed)
Pt has an appt on 4/13 with [facility]  ?

## 2021-07-26 NOTE — Telephone Encounter (Signed)
Will forward to provider  

## 2021-07-26 NOTE — Telephone Encounter (Signed)
Copied from [place] [id number]. Topic: Referral - Request for Referral >> Jul 25, 2021 11:34 AM Tessa Lerner A wrote: Has patient seen PCP for this complaint? Yes.   *If NO, is insurance requiring patient see PCP for this issue before PCP can refer them? Referral for which specialty: Rheumatology  Preferred provider/office: Patient has no preference but has stressed the urgency  Reason for referral: joint discomfort

## 2021-07-29 ENCOUNTER — Other Ambulatory Visit: Payer: Self-pay

## 2021-07-29 ENCOUNTER — Ambulatory Visit (HOSPITAL_COMMUNITY)
Admission: RE | Admit: 2021-07-29 | Discharge: 2021-07-29 | Disposition: A | Discharge status: Home / Self Care | Payer: 59 | Source: Ambulatory Visit | Attending: Orthopaedic Surgery | Admitting: Orthopaedic Surgery

## 2021-07-29 ENCOUNTER — Ambulatory Visit (INDEPENDENT_AMBULATORY_CARE_PROVIDER_SITE_OTHER): Payer: 59 | Admitting: Orthopaedic Surgery

## 2021-07-29 ENCOUNTER — Ambulatory Visit (HOSPITAL_BASED_OUTPATIENT_CLINIC_OR_DEPARTMENT_OTHER)
Admission: RE | Admit: 2021-07-29 | Discharge: 2021-07-29 | Disposition: A | Discharge status: Home / Self Care | Payer: 59 | Source: Ambulatory Visit | Attending: Orthopaedic Surgery | Admitting: Orthopaedic Surgery

## 2021-07-29 ENCOUNTER — Other Ambulatory Visit (HOSPITAL_BASED_OUTPATIENT_CLINIC_OR_DEPARTMENT_OTHER): Payer: Self-pay

## 2021-07-29 ENCOUNTER — Other Ambulatory Visit (HOSPITAL_BASED_OUTPATIENT_CLINIC_OR_DEPARTMENT_OTHER): Payer: Self-pay | Admitting: Orthopaedic Surgery

## 2021-07-29 ENCOUNTER — Ambulatory Visit
Admission: EM | Admit: 2021-07-29 | Discharge: 2021-07-29 | Disposition: A | Discharge status: Home / Self Care | Payer: 59 | Attending: Emergency Medicine | Admitting: Emergency Medicine

## 2021-07-29 ENCOUNTER — Encounter: Payer: Self-pay | Admitting: Emergency Medicine

## 2021-07-29 DIAGNOSIS — M1611 Unilateral primary osteoarthritis, right hip: Secondary | ICD-10-CM | POA: Diagnosis not present

## 2021-07-29 DIAGNOSIS — B341 Enterovirus infection, unspecified: Secondary | ICD-10-CM

## 2021-07-29 DIAGNOSIS — S161XXA Strain of muscle, fascia and tendon at neck level, initial encounter: Secondary | ICD-10-CM | POA: Diagnosis not present

## 2021-07-29 DIAGNOSIS — M25551 Pain in right hip: Secondary | ICD-10-CM

## 2021-07-29 DIAGNOSIS — B084 Enteroviral vesicular stomatitis with exanthem: Secondary | ICD-10-CM | POA: Diagnosis not present

## 2021-07-29 MED ORDER — METHOCARBAMOL 500 MG PO TABS
500.0000 mg | ORAL_TABLET | Freq: Two times a day (BID) | ORAL | 0 refills | Status: DC
  Filled 2021-07-29: qty 30, 15d supply, fill #0

## 2021-07-29 MED ORDER — TRIAMCINOLONE ACETONIDE 0.1 % EX OINT: 1.0000 "application " | TOPICAL_OINTMENT | Freq: Two times a day (BID) | CUTANEOUS | 0 refills | Status: DC

## 2021-07-29 MED ORDER — TRIAMCINOLONE ACETONIDE 0.1 % EX OINT: 1.0000 "application " | TOPICAL_OINTMENT | Freq: Two times a day (BID) | CUTANEOUS | 1 refills | Status: DC

## 2021-07-29 NOTE — ED Provider Notes (Addendum)
?[facility] ? ? ? ?CSN: [id number] ?Arrival date & time: 07/29/21  1201 ?  ? ?HISTORY  ? ?Chief Complaint  ?Patient presents with  ? Rash  ? ?HPI ?[name] is a 56 y.o. female. Patient presents to urgent care for evaluation of a rash that is on her hands, the bottom of her feet and her right leg.  States she also feels like she has some of the same lesions on her tongue, the back of her throat and possibly around her eyes.  Patient states she is never had a rash like this before.  Patient states she has been spending time with her 49-monthold grandson who goes to daycare.  States that he had a lesion in his mouth and another one in his ear and that has been very fussy. ? ?The history is provided by the patient.  ?Past Medical History:  ?Diagnosis Date  ? Anxiety DX 2011  ? Depression DX 1990  ? 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 Grave's disease   ? Graves Disease  ? History of radioactive iodine thyroid ablation   ? 02/21/2019  ? Menometrorrhagia   ? Migraines   ? ?Patient Active Problem List  ? Diagnosis Date Noted  ? Dysphagia 07/01/2020  ? 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 Graves disease 04/10/2019  ? Prediabetes 10/18/2018  ? Tinea pedis 07/31/2014  ? Screening for HIV (human immunodeficiency virus) 07/31/2014  ? Cramp of limb 01/13/2011  ? [place] lung ([place] 12/30/2010  ? Rhinitis, allergic 11/29/2010  ? 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 COPD, mild ([place] 08/06/2010  ? Graves' disease 06/03/2010  ? MIGRAINE HEADACHE 11/12/2008  ? ROTATOR CUFF TEAR 11/12/2008  ? DEPRESSION, RECURRENT 05/12/2008  ? ANXIETY DISORDER, GENERALIZED 02/05/2007  ? TOBACCO ABUSE 02/05/2007  ? ?Past Surgical History:  ?Procedure Laterality Date  ? ABDOMINAL HYSTERECTOMY    ? TUBAL LIGATION    ? ?OB History   ?No obstetric history on file. ?  ? ?Home Medications   ? ?Prior to Admission medications   ?Medication Sig Start Date End Date Taking? Authorizing Provider  ?albuterol (VENTOLIN HFA) 108 (90  Base) MCG/ACT inhaler Inhale 2 puffs into the lungs every 6 (six) hours as needed for wheezing or shortness of breath (Cough). 07/20/21   MLynden OxfordScales, PA-C  ?cefdinir (OMNICEF) 300 MG capsule Take 1 capsule (300 mg total) by mouth 2 (two) times daily for 14 days. 07/20/21 08/03/21  MLynden OxfordScales, PA-C  ?famotidine (PEPCID) 20 MG tablet Take 1 tablet (20 mg total) by mouth daily. 08/19/20   KNoralyn Pick NP  ?fexofenadine (ALLEGRA) 180 MG tablet Take 1 tablet (180 mg total) by mouth daily. 07/20/21 01/16/22  MLynden OxfordScales, PA-C  ?fluconazole (DIFLUCAN) 150 MG tablet Take 1 tablet on day 4 of antibiotics.  Take second tablet 3 days later. 07/20/21   MLynden OxfordScales, PA-C  ?fluticasone (FLONASE) 50 MCG/ACT nasal spray Place 2 sprays into both nostrils daily. 07/20/21   MLynden OxfordScales, PA-C  ?gabapentin (NEURONTIN) 100 MG capsule Take 3 capsules (300 mg total) by mouth at bedtime. Take as needed for hip pain. 12/03/19   HScot Jun FNP  ?HYDROcodone-acetaminophen (NORCO) 5-325 MG tablet Take 1 tablet by mouth every 6 (six) hours as needed for moderate pain. 33/29/51  GLianne Cure DO  ?ibuprofen (ADVIL) 600 MG tablet Take 1 tablet (600 mg total) by mouth every 6 (six) hours as needed. 38/84/16  GLianne Cure  DO  ?ipratropium (ATROVENT) 0.06 % nasal spray Place 2 sprays into both nostrils 4 (four) times daily. As needed for nasal congestion, runny nose 07/20/21   Lynden Oxford Scales, PA-C  ?levothyroxine (SYNTHROID) 88 MCG tablet Take 1 tablet (88 mcg total) by mouth as directed. Half a tablet on sundays and 1 tablet the rest of the week 01/07/21   Shamleffer, Melanie Crazier, MD  ?methocarbamol (ROBAXIN) 500 MG tablet Take 1 tablet (500 mg total) by mouth in the morning and at bedtime. 07/29/21   Katlyn Mulders, MD  ?nicotine (NICODERM CQ - DOSED IN MG/24 HOURS) 14 mg/24hr patch Place 1 patch (14 mg total) onto the skin daily. 10/17/18   Ladell Pier, MD   ?pantoprazole (PROTONIX) 40 MG tablet Take 1 tablet (40 mg total) by mouth 2 (two) times daily. 10/28/20   Thornton Park, MD  ? ?Family History ?Family History  ?Problem Relation Age of Onset  ? Hypertension Mother   ? Heart attack Mother   ? Hypertension Brother   ? Hypertension Father   ? Allergies Father   ? Allergies Other   ?     children  ? Breast cancer Maternal Aunt   ? Breast cancer Maternal Aunt   ? ?Social History ?Social History  ? ?Tobacco Use  ? Smoking status: Some Days  ?  Packs/day: 0.50  ?  Years: 32.00  ?  Pack years: 16.00  ?  Types: Cigarettes  ? Smokeless tobacco: Never  ?Vaping Use  ? Vaping Use: Never used  ?Substance Use Topics  ? Alcohol use: Yes  ?  Comment: occasional beer  ? Drug use: No  ? ?Allergies   ?Patient has no known allergies. ? ?Review of Systems ?Review of Systems ?Pertinent findings noted in history of present illness.  ? ?Physical Exam ?Triage Vital Signs ?ED Triage Vitals  ?Enc Vitals Group  ?   BP 02/25/21 0827 (!) 147/82  ?   Pulse Rate 02/25/21 0827 72  ?   Resp 02/25/21 0827 18  ?   Temp 02/25/21 0827 98.3 ?F (36.8 ?C)  ?   Temp Source 02/25/21 0827 Oral  ?   SpO2 02/25/21 0827 98 %  ?   Weight --   ?   Height --   ?   Head Circumference --   ?   Peak Flow --   ?   Pain Score 02/25/21 0826 5  ?   Pain Loc --   ?   Pain Edu? --   ?   Excl. in [place]? --   ?No data found. ? ?Updated Vital Signs ?BP (!) 139/94 (BP Location: Left Arm)   Pulse 72   Temp 98.1 ?F (36.7 ?C) (Oral)   Resp 18   LMP 04/23/2011   SpO2 95%  ? ?Physical Exam ?Vitals and nursing note reviewed.  ?Constitutional:   ?   General: She is not in acute distress. ?   Appearance: Normal appearance. She is not ill-appearing.  ?HENT:  ?   Head: Normocephalic and atraumatic.  ?   Salivary Glands: Right salivary gland is not diffusely enlarged or tender. Left salivary gland is not diffusely enlarged or tender.  ?   Right Ear: Tympanic membrane, ear canal and external ear normal. No drainage. No middle ear  effusion. There is no impacted cerumen. Tympanic membrane is not erythematous or bulging.  ?   Left Ear: Tympanic membrane, ear canal and external ear normal. No drainage.  No middle ear  effusion. There is no impacted cerumen. Tympanic membrane is not erythematous or bulging.  ?   Nose: Nose normal. No nasal deformity, septal deviation, mucosal edema, congestion or rhinorrhea.  ?   Right Turbinates: Not enlarged, swollen or pale.  ?   Left Turbinates: Not enlarged, swollen or pale.  ?   Right Sinus: No maxillary sinus tenderness or frontal sinus tenderness.  ?   Left Sinus: No maxillary sinus tenderness or frontal sinus tenderness.  ?   Mouth/Throat:  ?   Lips: Pink. No lesions.  ?   Mouth: Mucous membranes are moist. No oral lesions.  ?   Palate: Lesions present.  ?   Pharynx: Oropharynx is clear. Uvula midline. No posterior oropharyngeal erythema or uvula swelling.  ?   Tonsils: No tonsillar exudate. 0 on the right. 0 on the left.  ?   Comments: Vesicular lesions on erythematous base at back of palate and on tip of tongue.  Tongue is otherwise erythematous. ?Eyes:  ?   General: Lids are normal.     ?   Right eye: No discharge.     ?   Left eye: No discharge.  ?   Extraocular Movements: Extraocular movements intact.  ?   Conjunctiva/sclera: Conjunctivae normal.  ?   Right eye: Right conjunctiva is not injected. No exudate. ?   Left eye: Left conjunctiva is not injected. No exudate. ?   Pupils: Pupils are equal, round, and reactive to light.  ?   Slit lamp exam: ?   Right eye: Anterior chamber quiet.  ?   Left eye: Anterior chamber quiet.  ?Neck:  ?   Trachea: Trachea and phonation normal.  ?Cardiovascular:  ?   Rate and Rhythm: Normal rate and regular rhythm.  ?   Pulses: Normal pulses.  ?   Heart sounds: Normal heart sounds. No murmur heard. ?  No friction rub. No gallop.  ?Pulmonary:  ?   Effort: Pulmonary effort is normal. No accessory muscle usage, prolonged expiration or respiratory distress.  ?   Breath  sounds: Normal breath sounds. No stridor, decreased air movement or transmitted upper airway sounds. No decreased breath sounds, wheezing, rhonchi or rales.  ?Chest:  ?   Chest wall: No tenderness.  ?Musculoskeletal:
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
## 2021-07-29 NOTE — Discharge Instructions (Addendum)
Based on the history that you provided to me today and physical exam findings, I believe that you have hand-foot-and-mouth disease.  Thank you patient.  The incubation period for this disease is 3 to 5 days and you will continue to remain contagious for approximately 2 weeks.   ? ?There is currently no antiviral treatment for this viral illness.  To relieve the discomfort in your skin, I provided you with a prescription for triamcinolone ointment that you can apply twice daily as needed.  You can also take Tylenol 1000 mg and ibuprofen 400 mg every 8 hours as needed for discomfort. ? ?If any of the lesions become significantly more red, significantly increased in size or begin to look like they have purulent drainage, please return for repeat evaluation.  You may require antibiotics if any of the lesions become infected. ?

## 2021-07-29 NOTE — ED Triage Notes (Signed)
Patient presents to [facility] for evaluation of rash to hands, bottom of feet, right leg.  She also feels like she has some on her tongue, the back of the throat, and possibly around the eyes.   ?

## 2021-08-09 ENCOUNTER — Other Ambulatory Visit (HOSPITAL_BASED_OUTPATIENT_CLINIC_OR_DEPARTMENT_OTHER): Payer: Self-pay | Admitting: Orthopaedic Surgery

## 2021-08-09 DIAGNOSIS — M25552 Pain in left hip: Secondary | ICD-10-CM

## 2021-08-10 ENCOUNTER — Ambulatory Visit (HOSPITAL_BASED_OUTPATIENT_CLINIC_OR_DEPARTMENT_OTHER)
Admission: RE | Admit: 2021-08-10 | Discharge: 2021-08-10 | Disposition: A | Discharge status: Home / Self Care | Payer: 59 | Source: Ambulatory Visit | Attending: Orthopaedic Surgery | Admitting: Orthopaedic Surgery

## 2021-08-10 ENCOUNTER — Other Ambulatory Visit (HOSPITAL_BASED_OUTPATIENT_CLINIC_OR_DEPARTMENT_OTHER): Payer: Self-pay

## 2021-08-10 ENCOUNTER — Ambulatory Visit (INDEPENDENT_AMBULATORY_CARE_PROVIDER_SITE_OTHER): Payer: 59 | Admitting: Orthopaedic Surgery

## 2021-08-10 DIAGNOSIS — M25551 Pain in right hip: Secondary | ICD-10-CM | POA: Diagnosis not present

## 2021-08-10 DIAGNOSIS — M25552 Pain in left hip: Secondary | ICD-10-CM | POA: Insufficient documentation

## 2021-08-10 DIAGNOSIS — M160 Bilateral primary osteoarthritis of hip: Secondary | ICD-10-CM

## 2021-08-10 MED ORDER — TRAMADOL HCL 50 MG PO TABS
50.0000 mg | ORAL_TABLET | Freq: Four times a day (QID) | ORAL | 0 refills | Status: DC | PRN
  Filled 2021-08-10: qty 30, 8d supply, fill #0
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
## 2021-08-11 ENCOUNTER — Encounter: Payer: Self-pay | Admitting: Physician Assistant

## 2021-08-11 ENCOUNTER — Ambulatory Visit: Payer: 59 | Attending: Physician Assistant | Admitting: Physician Assistant

## 2021-08-11 VITALS — BP 128/88 | HR 75 | Wt 150.4 lb

## 2021-08-11 DIAGNOSIS — R7303 Prediabetes: Secondary | ICD-10-CM | POA: Diagnosis not present

## 2021-08-11 DIAGNOSIS — R030 Elevated blood-pressure reading, without diagnosis of hypertension: Secondary | ICD-10-CM

## 2021-08-11 DIAGNOSIS — L308 Other specified dermatitis: Secondary | ICD-10-CM

## 2021-08-11 DIAGNOSIS — Z13220 Encounter for screening for lipoid disorders: Secondary | ICD-10-CM

## 2021-08-11 DIAGNOSIS — Z09 Encounter for follow-up examination after completed treatment for conditions other than malignant neoplasm: Secondary | ICD-10-CM

## 2021-08-11 DIAGNOSIS — E890 Postprocedural hypothyroidism: Secondary | ICD-10-CM

## 2021-08-11 NOTE — Progress Notes (Signed)
Patient ID: [name], female   DOB: 1965/08/02, 56 y.o.   MRN: [id number] ? ? ? ? ?[name], is a 56 y.o. female ? ?[id number] ? ?[id number] ? ?DOB - 1966-04-06 ? ?No chief complaint on file. ?    ? ?Subjective:  ? ?[name] is a 56 y.o. female here today for a follow up visit Seen at [facility] 07/29/2021 and diagnosed with hand,foot, and mouth.  She did not have any known exposures even her grandchildren were not sick.  Her hands and feet have improved from the blisters but now they are "peeling."  She wants to get labs tested.  No fevers.  They did bmp and cbc which were unremarkable.  She is feeling better from all that.  Hands no longer tender, just peeling.  No abdominal pain.  She does not need RF ? ?From UC 3/31 note: ?Based on the history that you provided to me today and physical exam findings, I believe that you have hand-foot-and-mouth disease.  Thank you patient.  The incubation period for this disease is 3 to 5 days and you will continue to remain contagious for approximately 2 weeks.   ?  ?There is currently no antiviral treatment for this viral illness.  To relieve the discomfort in your skin, I provided you with a prescription for triamcinolone ointment that you can apply twice daily as needed.  You can also take Tylenol 1000 mg and ibuprofen 400 mg every 8 hours as needed for discomfort. ? ?If any of the lesions become significantly more red, significantly increased in size or begin to look like they have purulent drainage, please return for repeat evaluation.  You may require antibiotics if any of the lesions become infected.. Patient has No headache, No chest pain, No abdominal pain - No Nausea, No new weakness tingling or numbness, No Cough - SOB. ? ?No problems updated. ? ?ALLERGIES: ?No Known Allergies ? ?PAST MEDICAL HISTORY: ?Past Medical History:  ?Diagnosis Date  ? Anxiety DX 2011  ? Depression DX 1990  ? 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 Grave's disease   ? Graves Disease   ? History of radioactive iodine thyroid ablation   ? 02/21/2019  ? Menometrorrhagia   ? Migraines   ? ? ?MEDICATIONS AT HOME: ?Prior to Admission medications   ?Medication Sig Start Date End Date Taking? Authorizing Provider  ?albuterol (VENTOLIN HFA) 108 (90 Base) MCG/ACT inhaler Inhale 2 puffs into the lungs every 6 (six) hours as needed for wheezing or shortness of breath (Cough). 07/20/21   Lynden Oxford Scales, PA-C  ?famotidine (PEPCID) 20 MG tablet Take 1 tablet (20 mg total) by mouth daily. 08/19/20   Noralyn Pick, NP  ?fexofenadine (ALLEGRA) 180 MG tablet Take 1 tablet (180 mg total) by mouth daily. 07/20/21 01/16/22  Lynden Oxford Scales, PA-C  ?fluconazole (DIFLUCAN) 150 MG tablet Take 1 tablet on day 4 of antibiotics.  Take second tablet 3 days later. 07/20/21   Lynden Oxford Scales, PA-C  ?fluticasone (FLONASE) 50 MCG/ACT nasal spray Place 2 sprays into both nostrils daily. 07/20/21   Lynden Oxford Scales, PA-C  ?gabapentin (NEURONTIN) 100 MG capsule Take 3 capsules (300 mg total) by mouth at bedtime. Take as needed for hip pain. 12/03/19   Scot Jun, FNP  ?HYDROcodone-acetaminophen (NORCO) 5-325 MG tablet Take 1 tablet by mouth every 6 (six) hours as needed for moderate pain. 4/69/62   Lianne Cure, DO  ?ibuprofen (ADVIL) 600 MG tablet Take 1 tablet (  600 mg total) by mouth every 6 (six) hours as needed. 7/82/95   Campbell Stall P, DO  ?ipratropium (ATROVENT) 0.06 % nasal spray Place 2 sprays into both nostrils 4 (four) times daily. As needed for nasal congestion, runny nose 07/20/21   Lynden Oxford Scales, PA-C  ?levothyroxine (SYNTHROID) 88 MCG tablet Take 1 tablet (88 mcg total) by mouth as directed. Half a tablet on sundays and 1 tablet the rest of the week 01/07/21   Shamleffer, Melanie Crazier, MD  ?methocarbamol (ROBAXIN) 500 MG tablet Take 1 tablet (500 mg total) by mouth in the morning and at bedtime. 07/29/21   Aprile Mulders, MD  ?nicotine (NICODERM CQ - DOSED IN MG/24  HOURS) 14 mg/24hr patch Place 1 patch (14 mg total) onto the skin daily. 10/17/18   Ladell Pier, MD  ?pantoprazole (PROTONIX) 40 MG tablet Take 1 tablet (40 mg total) by mouth 2 (two) times daily. 10/28/20   Thornton Park, MD  ?traMADol (ULTRAM) 50 MG tablet Take 1 tablet (50 mg total) by mouth every 6 (six) hours as needed. 08/10/21   Ivelis Mulders, MD  ?triamcinolone ointment (KENALOG) 0.1 % Apply 1 application. topically 2 (two) times daily. 07/29/21   Lynden Oxford Scales, PA-C  ? ? ?ROS: ?Neg HEENT ?Neg resp ?Neg cardiac ?Neg GI ?Neg GU ?Neg MS ?Neg psych ?Neg neuro ? ?Objective:  ? ?Vitals:  ? 08/11/21 1054  ?BP: 128/88  ?Pulse: 75  ?SpO2: 96%  ?Weight: 150 lb 6.4 oz (68.2 kg)  ? ?Exam ?General appearance : Awake, alert, not in any distress. Speech Clear. Not toxic looking ?HEENT: Atraumatic and Normocephalic ?Neck: Supple, no JVD. No cervical lymphadenopathy.  ?Chest: Good air entry bilaterally, CTAB.  No rales/rhonchi/wheezing ?CVS: S1 S2 regular, no murmurs.  ?Extremities: B/L Lower Ext shows no edema, both legs are warm to touch ?Neurology: Awake alert, and oriented X 3, CN II-XII intact, Non focal ?Skin: B hands erythematous and peeling  ? ?Data Review ?Lab Results  ?Component Value Date  ? HGBA1C 5.7 (H) 10/17/2018  ? HGBA1C 6.0 (H) 07/17/2014  ? HGBA1C  09/09/2008  ?  5.6 ?(NOTE) The ADA recommends the following therapeutic goal for glycemic control related to Hgb A1c measurement: Goal of therapy: <6.5 Hgb A1c  Reference: American Diabetes Association: Clinical Practice Recommendations 2010, Diabetes Care, 2010, 33: (Suppl ? 1).  ? ? ?Assessment & Plan  ? ?1. Desquamative dermatitis ?Was dx hand, foot, mouth nut no one in family or grandchildren have has similar ?- Vitamin D, 25-hydroxy ?- RPR w/reflex to TrepSure ? ?2. Elevated blood pressure reading ?BP initially higher 151/90 upon arrival but after sitting and deep breathing, BP came down.   ? ?3. Prediabetes ?I have had a lengthy  discussion and provided education about insulin resistance and the intake of too much sugar/refined carbohydrates.  I have advised the patient to work at a goal of eliminating sugary drinks, candy, desserts, sweets, refined sugars, processed foods, and white carbohydrates.  The patient expresses understanding.  ?- Hemoglobin A1c ? ?4. Screening cholesterol level ?- Lipid panel ? ?5. Postablative hypothyroidism ?Continue synthroid ?- Thyroid Panel With TSH ?- Hepatic Function Panel ? ?6. Encounter for examination following treatment at hospital ? ? ? ?Patient have been counseled extensively about nutrition and exercise. Other issues discussed during this visit include: low cholesterol diet, weight control and daily exercise, foot care, annual eye examinations at Ophthalmology, importance of adherence with medications and regular follow-up. We also discussed long term complications of  uncontrolled diabetes and hypertension.  ? ?Return in about 6 months (around 02/10/2022) for pcp for chronic conditions. ? ?The patient was given clear instructions to go to ER or return to medical center if symptoms don't improve, worsen or new problems develop. The patient verbalized understanding. The patient was told to call to get lab results if they haven't heard anything in the next week.  ? ? ? ? ?Freeman Caldron, PA-C ?[facility] ?[place], [place] ?[phone]   ?08/11/2021, 11:58 AM  ?

## 2021-08-13 ENCOUNTER — Other Ambulatory Visit: Payer: Self-pay | Admitting: Physician Assistant

## 2021-08-13 DIAGNOSIS — E559 Vitamin D deficiency, unspecified: Secondary | ICD-10-CM

## 2021-08-13 DIAGNOSIS — A530 Latent syphilis, unspecified as early or late: Secondary | ICD-10-CM

## 2021-08-13 LAB — HEPATIC FUNCTION PANEL
ALT: 8 IU/L (ref 0–32)
AST: 11 IU/L (ref 0–40)
Albumin: 4.6 g/dL (ref 3.8–4.9)
Alkaline Phosphatase: 82 IU/L (ref 44–121)
Bilirubin Total: <0.2 mg/dL (ref 0.0–1.2)
Bilirubin, Direct: <0.1 mg/dL (ref 0.00–0.40)
Total Protein: 7.5 g/dL (ref 6.0–8.5)

## 2021-08-13 LAB — THYROID PANEL WITH TSH
Free Thyroxine Index: 2.8 (ref 1.2–4.9)
T3 Uptake Ratio: 32 % (ref 24–39)
T4, Total: 8.6 ug/dL (ref 4.5–12.0)
TSH: 0.754 u[IU]/mL (ref 0.450–4.500)

## 2021-08-13 LAB — LIPID PANEL
Chol/HDL Ratio: 2.4 ratio (ref 0.0–4.4)
Cholesterol, Total: 159 mg/dL (ref 100–199)
HDL: 65 mg/dL (ref >39)
LDL Chol Calc (NIH): 83 mg/dL (ref 0–99)
Triglycerides: 53 mg/dL (ref 0–149)
VLDL Cholesterol Cal: 11 mg/dL (ref 5–40)

## 2021-08-13 LAB — HEMOGLOBIN A1C
Est. average glucose Bld gHb Est-mCnc: 131 mg/dL
Hgb A1c MFr Bld: 6.2 % — ABNORMAL HIGH (ref 4.8–5.6)

## 2021-08-13 LAB — RPR W/REFLEX TO TREPSURE: RPR: REACTIVE — AB

## 2021-08-13 LAB — VITAMIN D 25 HYDROXY (VIT D DEFICIENCY, FRACTURES): Vit D, 25-Hydroxy: 15.5 ng/mL — ABNORMAL LOW (ref 30.0–100.0)

## 2021-08-13 LAB — RPR, QUANT: RPR, Quant: 1:1 {titer} — ABNORMAL HIGH

## 2021-08-13 MED ORDER — VITAMIN D (ERGOCALCIFEROL) 1.25 MG (50000 UNIT) PO CAPS: 50000.0000 [IU] | ORAL_CAPSULE | ORAL | 0 refills | Status: DC

## 2021-08-13 MED ORDER — PENICILLIN G BENZATHINE 1200000 UNIT/2ML IM SUSY
2.4000 10*6.[IU] | PREFILLED_SYRINGE | Freq: Once | INTRAMUSCULAR | Status: AC
  Administered 2021-08-17: 2.4 10*6.[IU] via INTRAMUSCULAR

## 2021-08-17 ENCOUNTER — Ambulatory Visit: Payer: 59 | Attending: Internal Medicine | Admitting: *Deleted

## 2021-08-17 DIAGNOSIS — A530 Latent syphilis, unspecified as early or late: Secondary | ICD-10-CM

## 2021-08-23 ENCOUNTER — Other Ambulatory Visit: Payer: Self-pay

## 2021-08-23 ENCOUNTER — Ambulatory Visit: Payer: 59 | Admitting: Internal Medicine

## 2021-08-23 ENCOUNTER — Ambulatory Visit: Payer: 59 | Admitting: Orthopaedic Surgery

## 2021-08-23 NOTE — Progress Notes (Deleted)
[facility] for Infectious Disease  Reason for Consult: Positive RPR  Referring Provider: Dr Wynetta Emery   HPI:    [name] is a 56 y.o. female with PMHx as below who presents to the clinic for positive RPR.   Patient was seen at ED for general boy aches on 3/25 and discharged home.  Then seen at Urgent Care 3/31 for a rash on her hands, feet, and right leg.  Also noted to have lesions on her tongue and eyes.  No prior history of rash. She was diagnosed with Hand-Foot-Mouth disease and discharged.  She then saw her PCP on 4/13 to have labs tested.  At that time her rash was improving.  An RPR was checked and found to be positive at 1:1 titer.  She received a dose of Bicillin 2.4 million units x 1 dose.    Patient's Medications  New Prescriptions   No medications on file  Previous Medications   ALBUTEROL (VENTOLIN HFA) 108 (90 BASE) MCG/ACT INHALER    Inhale 2 puffs into the lungs every 6 (six) hours as needed for wheezing or shortness of breath (Cough).   FAMOTIDINE (PEPCID) 20 MG TABLET    Take 1 tablet (20 mg total) by mouth daily.   FEXOFENADINE (ALLEGRA) 180 MG TABLET    Take 1 tablet (180 mg total) by mouth daily.   FLUCONAZOLE (DIFLUCAN) 150 MG TABLET    Take 1 tablet on day 4 of antibiotics.  Take second tablet 3 days later.   FLUTICASONE (FLONASE) 50 MCG/ACT NASAL SPRAY    Place 2 sprays into both nostrils daily.   GABAPENTIN (NEURONTIN) 100 MG CAPSULE    Take 3 capsules (300 mg total) by mouth at bedtime. Take as needed for hip pain.   HYDROCODONE-ACETAMINOPHEN (NORCO) 5-325 MG TABLET    Take 1 tablet by mouth every 6 (six) hours as needed for moderate pain.   IBUPROFEN (ADVIL) 600 MG TABLET    Take 1 tablet (600 mg total) by mouth every 6 (six) hours as needed.   IPRATROPIUM (ATROVENT) 0.06 % NASAL SPRAY    Place 2 sprays into both nostrils 4 (four) times daily. As needed for nasal congestion, runny nose   LEVOTHYROXINE (SYNTHROID) 88 MCG TABLET    Take 1 tablet  (88 mcg total) by mouth as directed. Half a tablet on sundays and 1 tablet the rest of the week   METHOCARBAMOL (ROBAXIN) 500 MG TABLET    Take 1 tablet (500 mg total) by mouth in the morning and at bedtime.   NICOTINE (NICODERM CQ - DOSED IN MG/24 HOURS) 14 MG/24HR PATCH    Place 1 patch (14 mg total) onto the skin daily.   PANTOPRAZOLE (PROTONIX) 40 MG TABLET    Take 1 tablet (40 mg total) by mouth 2 (two) times daily.   TRAMADOL (ULTRAM) 50 MG TABLET    Take 1 tablet (50 mg total) by mouth every 6 (six) hours as needed.   TRIAMCINOLONE OINTMENT (KENALOG) 0.1 %    Apply 1 application. topically 2 (two) times daily.   VITAMIN D, ERGOCALCIFEROL, (DRISDOL) 1.25 MG (50000 UNIT) CAPS CAPSULE    Take 1 capsule (50,000 Units total) by mouth every 7 (seven) days.  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[place])    Grave's disease    Graves Disease   History of radioactive iodine thyroid ablation    02/21/2019   Menometrorrhagia    Migraines     Social History   Tobacco Use   Smoking status: Some Days    Packs/day: 0.50    Years: 32.00    Pack years: 16.00    Types: Cigarettes   Smokeless tobacco: Never  Vaping Use   Vaping Use: Never used  Substance Use Topics   Alcohol use: Yes    Comment: occasional beer   Drug use: No    Family History  Problem Relation Age of Onset   Hypertension Mother    Heart attack Mother    Hypertension Brother    Hypertension Father    Allergies Father    Allergies Other        children   Breast cancer Maternal Aunt    Breast cancer Maternal Aunt     No Known Allergies  ROS    OBJECTIVE:    There were no vitals filed for this visit.   There is no height or weight on file to calculate BMI.  Physical Exam   Labs and Microbiology:     Latest Ref Rng & Units  07/23/2021    8:19 PM 08/19/2020   12:33 PM 02/25/2020    3:02 PM  CBC  WBC 4.0 - 10.5 K/uL 10.2   5.3   4.5    Hemoglobin 12.0 - 15.0 g/dL 11.8   12.0   11.9    Hematocrit 36.0 - 46.0 % 37.3   36.6   36.3    Platelets 150 - 400 K/uL 357   328.0   350        Latest Ref Rng & Units 08/11/2021   11:21 AM 07/23/2021    8:19 PM 08/19/2020   12:33 PM  CMP  Glucose 70 - 99 mg/dL  94   87    BUN 6 - 20 mg/dL  19   18    Creatinine 0.44 - 1.00 mg/dL  0.58   0.90    Sodium 135 - 145 mmol/L  141   139    Potassium 3.5 - 5.1 mmol/L  3.6   3.9    Chloride 98 - 111 mmol/L  103   105    CO2 22 - 32 mmol/L  29   27    Calcium 8.9 - 10.3 mg/dL  9.9   9.5    Total Protein 6.0 - 8.5 g/dL 7.5    7.2    Total Bilirubin 0.0 - 1.2 mg/dL <0.2    0.3    Alkaline Phos 44 - 121 IU/L 82    68    AST 0 - 40 IU/L 11    11    ALT 0 - 32 IU/L 8    9        ASSESSMENT & PLAN:    No problem-specific Assessment & Plan notes found for this encounter.   No orders of the defined types were placed in this encounter.     Discussed with patient her labs suggest prior syphilis infection at some point with positive RPR and low level titer of 1:1 but a low titer like hers is not consistent with secondary syphilis and therefore an unlikely explanation for her rash which is resolving.  This is also supported by her lack of sexual activity for ***.  She reports no prior syphilis testing  or treatment in her lifetime.  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  Will repeat syphilis testing with confirmatory treponemal test today.  Will give 2nd dose of Bicillin 2.4 million today and 3rd dose in 1 week.  If her RPR ends up being a false positive then she can cancel her 3rd shot.  Will also check HIV today.  RTC as needed.  Raynelle Highland for  Infectious Disease [facility] 08/23/2021, 4:40 AM

## 2021-08-30 ENCOUNTER — Ambulatory Visit: Payer: 59 | Admitting: Internal Medicine

## 2021-08-30 NOTE — Progress Notes (Deleted)
[facility] for Infectious Disease  Reason for Consult: Positive RPR  Referring Provider: Dr Wynetta Emery   HPI:    [name] is a 56 y.o. female with PMHx as below who presents to the clinic for positive RPR.   Patient was seen at ED for general boy aches on 3/25 and discharged home.  Then seen at Urgent Care 3/31 for a rash on her hands, feet, and right leg.  Also noted to have lesions on her tongue and around her eyes.  No prior history of rash. She was diagnosed with Hand-Foot-Mouth disease and discharged.  She then saw her PCP on 4/13 to have labs tested.  At that time her rash was improving.  An RPR was checked and found to be positive at 1:1 titer.  She received a dose of Bicillin 2.4 million units x 1 dose.  She was scheduled for new visit with myself on 4/25 but had to reschedule for today.  Patient's Medications  New Prescriptions   No medications on file  Previous Medications   ALBUTEROL (VENTOLIN HFA) 108 (90 BASE) MCG/ACT INHALER    Inhale 2 puffs into the lungs every 6 (six) hours as needed for wheezing or shortness of breath (Cough).   FAMOTIDINE (PEPCID) 20 MG TABLET    Take 1 tablet (20 mg total) by mouth daily.   FEXOFENADINE (ALLEGRA) 180 MG TABLET    Take 1 tablet (180 mg total) by mouth daily.   FLUCONAZOLE (DIFLUCAN) 150 MG TABLET    Take 1 tablet on day 4 of antibiotics.  Take second tablet 3 days later.   FLUTICASONE (FLONASE) 50 MCG/ACT NASAL SPRAY    Place 2 sprays into both nostrils daily.   GABAPENTIN (NEURONTIN) 100 MG CAPSULE    Take 3 capsules (300 mg total) by mouth at bedtime. Take as needed for hip pain.   HYDROCODONE-ACETAMINOPHEN (NORCO) 5-325 MG TABLET    Take 1 tablet by mouth every 6 (six) hours as needed for moderate pain.   IBUPROFEN (ADVIL) 600 MG TABLET    Take 1 tablet (600 mg total) by mouth every 6 (six) hours as needed.   IPRATROPIUM (ATROVENT) 0.06 % NASAL SPRAY    Place 2 sprays into both nostrils 4 (four) times daily. As needed  for nasal congestion, runny nose   LEVOTHYROXINE (SYNTHROID) 88 MCG TABLET    Take 1 tablet (88 mcg total) by mouth as directed. Half a tablet on sundays and 1 tablet the rest of the week   METHOCARBAMOL (ROBAXIN) 500 MG TABLET    Take 1 tablet (500 mg total) by mouth in the morning and at bedtime.   NICOTINE (NICODERM CQ - DOSED IN MG/24 HOURS) 14 MG/24HR PATCH    Place 1 patch (14 mg total) onto the skin daily.   PANTOPRAZOLE (PROTONIX) 40 MG TABLET    Take 1 tablet (40 mg total) by mouth 2 (two) times daily.   TRAMADOL (ULTRAM) 50 MG TABLET    Take 1 tablet (50 mg total) by mouth every 6 (six) hours as needed.   TRIAMCINOLONE OINTMENT (KENALOG) 0.1 %    Apply 1 application. topically 2 (two) times daily.   VITAMIN D, ERGOCALCIFEROL, (DRISDOL) 1.25 MG (50000 UNIT) CAPS CAPSULE    Take 1 capsule (50,000 Units total) by mouth every 7 (seven) days.  Modified Medications   No medications on file  Discontinued Medications   No medications on file      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[place])    Grave's disease    Graves Disease   History of radioactive iodine thyroid ablation    02/21/2019   Menometrorrhagia    Migraines     Social History   Tobacco Use   Smoking status: Some Days    Packs/day: 0.50    Years: 32.00    Pack years: 16.00    Types: Cigarettes   Smokeless tobacco: Never  Vaping Use   Vaping Use: Never used  Substance Use Topics   Alcohol use: Yes    Comment: occasional beer   Drug use: No    Family History  Problem Relation Age of Onset   Hypertension Mother    Heart attack Mother    Hypertension Brother    Hypertension Father    Allergies Father    Allergies Other        children   Breast cancer Maternal Aunt    Breast cancer Maternal Aunt     No Known Allergies  ROS    OBJECTIVE:    There were no vitals filed for this visit.   There is no height or weight on file to  calculate BMI.  Physical Exam   Labs and Microbiology:     Latest Ref Rng & Units 07/23/2021    8:19 PM 08/19/2020   12:33 PM 02/25/2020    3:02 PM  CBC  WBC 4.0 - 10.5 K/uL 10.2   5.3   4.5    Hemoglobin 12.0 - 15.0 g/dL 11.8   12.0   11.9    Hematocrit 36.0 - 46.0 % 37.3   36.6   36.3    Platelets 150 - 400 K/uL 357   328.0   350        Latest Ref Rng & Units 08/11/2021   11:21 AM 07/23/2021    8:19 PM 08/19/2020   12:33 PM  CMP  Glucose 70 - 99 mg/dL  94   87    BUN 6 - 20 mg/dL  19   18    Creatinine 0.44 - 1.00 mg/dL  0.58   0.90    Sodium 135 - 145 mmol/L  141   139    Potassium 3.5 - 5.1 mmol/L  3.6   3.9    Chloride 98 - 111 mmol/L  103   105    CO2 22 - 32 mmol/L  29   27    Calcium 8.9 - 10.3 mg/dL  9.9   9.5    Total Protein 6.0 - 8.5 g/dL 7.5    7.2    Total Bilirubin 0.0 - 1.2 mg/dL <0.2    0.3    Alkaline Phos 44 - 121 IU/L 82    68    AST 0 - 40 IU/L 11    11    ALT 0 - 32 IU/L 8    9        ASSESSMENT & PLAN:    No problem-specific Assessment & Plan notes found for this encounter.   No orders of the defined types were placed in this encounter.     Discussed with patient her labs suggest prior syphilis infection at some point with positive RPR and low level titer of 1:1 but a low titer like hers is not consistent with secondary syphilis and therefore an unlikely explanation for her rash which is resolved.  This is also  supported by her lack of sexual activity for ***.  She reports no prior syphilis testing or treatment in her lifetime.  Discussed with patient that it is possible her RPR was falsely positive as no confirmatory treponemal testing was done.  Will repeat syphilis testing with confirmatory treponemal test today.  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  Will also check HIV today.  RTC based on results of syphilis testing sent today.  Raynelle Highland for Infectious  Disease [facility] 08/30/2021, 6:40 AM

## 2021-08-31 ENCOUNTER — Ambulatory Visit: Payer: 59 | Admitting: Orthopaedic Surgery

## 2021-08-31 VITALS — Ht 67.0 in | Wt 150.4 lb

## 2021-08-31 DIAGNOSIS — M1611 Unilateral primary osteoarthritis, right hip: Secondary | ICD-10-CM | POA: Insufficient documentation

## 2021-08-31 DIAGNOSIS — M25551 Pain in right hip: Secondary | ICD-10-CM | POA: Diagnosis not present

## 2021-08-31 MED ORDER — IBUPROFEN 800 MG PO TABS: 800.0000 mg | ORAL_TABLET | Freq: Three times a day (TID) | ORAL | 3 refills | Status: DC | PRN

## 2021-08-31 NOTE — Progress Notes (Signed)
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  She had MRI of her hip many years ago around 2021 showing significant cartilage thinning.  It is gotten worse for her and she has developed groin pain.  She has tried and failed conservative treatment for over a year including activity modification and taking anti-inflammatories.  She has had an intra-articular injection of the right hip which only temporize symptoms back in 2021.  She works as a Secretary/administrator and at this point wishes to consider hip replacement surgery for her right hip.  She does have mainly groin pain.  It hurts with activities and causing her to have a limp.  She is very stiff when she gets up from a sitting position for a while.  She denies being a diabetic.  She is a thin individual and active. ? ?Examination of the right hip shows no blocks to rotation but there is stiffness with rotation with significant pain in the groin with rotation.  Her left hip mainly hurts over the trochanteric area. ? ?I did review plain films of her pelvis and hips.  There is evidence of femoral acetabular impingement with joint space narrowing and peritubular osteophytes around the right hip.  Her previous MRI did show significant cartilage thinning as well. ? ?At this point we did discuss hip replacement surgery.  I talked about the risks and benefits of the surgery.  I went over hip replacement model and described what to expect from an intraoperative and postoperative course.  Also gave her handout about hip replacement surgery.  All question concerns were answered and addressed.  We will work on getting her scheduled for right total hip arthroplasty. ?

## 2021-09-08 ENCOUNTER — Encounter: Payer: Self-pay | Admitting: Internal Medicine

## 2021-09-08 ENCOUNTER — Ambulatory Visit: Payer: 59 | Admitting: Internal Medicine

## 2021-09-08 ENCOUNTER — Other Ambulatory Visit: Payer: Self-pay

## 2021-09-08 VITALS — BP 153/82 | HR 85 | Temp 98.1°F | Wt 154.0 lb

## 2021-09-08 DIAGNOSIS — Z114 Encounter for screening for human immunodeficiency virus [HIV]: Secondary | ICD-10-CM | POA: Diagnosis not present

## 2021-09-08 DIAGNOSIS — A539 Syphilis, unspecified: Secondary | ICD-10-CM | POA: Diagnosis not present

## 2021-09-08 DIAGNOSIS — B084 Enteroviral vesicular stomatitis with exanthem: Secondary | ICD-10-CM | POA: Diagnosis not present

## 2021-09-08 NOTE — Assessment & Plan Note (Signed)
Discussed with patient her labs suggest possible prior syphilis infection at some point with positive RPR and low level titer of 1:1.  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  She reports no prior syphilis testing or treatment in her lifetime.  Discussed with patient that it is possible her RPR was falsely positive as no confirmatory treponemal testing was done.  Will repeat syphilis testing with confirmatory treponemal test today.  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  Will also check HIV today.  RTC based on results of syphilis testing sent today. ?

## 2021-09-08 NOTE — Progress Notes (Signed)
?  ? ? ?[facility] ? ?Reason for Consult: Positive RPR ? ?Referring Provider: Dr Wynetta Emery ? ? ?HPI:   ? ?[name] is a 56 y.o. female with PMHx as below who presents to the clinic for positive RPR.  ? ?Patient was seen at ED for general body aches on 3/25 and discharged home.  Then seen at Urgent Care 3/31 for a rash on her hands, feet, and right leg.  Also noted to have lesions on her tongue and around her eyes.  No prior history of rash. She was diagnosed with Hand-Foot-Mouth disease and discharged.  She then saw her PCP on 4/13 to have labs tested.  At that time her rash was improving.  An RPR was checked and found to be positive at 1:1 titer.  She received a dose of Bicillin 2.4 million units x 1 dose.  She was scheduled for new visit with myself on 4/25 but had to reschedule for today.  She reports her rash has resolved.  She is feeling well other than hip pain due to OA and is hoping for a hip replacement soon.  She has no fevers or chills.  ? ?Patient's Medications  ?New Prescriptions  ? No medications on file  ?Previous Medications  ? ALBUTEROL (VENTOLIN HFA) 108 (90 BASE) MCG/ACT INHALER    Inhale 2 puffs into the lungs every 6 (six) hours as needed for wheezing or shortness of breath (Cough).  ? FAMOTIDINE (PEPCID) 20 MG TABLET    Take 1 tablet (20 mg total) by mouth daily.  ? FEXOFENADINE (ALLEGRA) 180 MG TABLET    Take 1 tablet (180 mg total) by mouth daily.  ? FLUCONAZOLE (DIFLUCAN) 150 MG TABLET    Take 1 tablet on day 4 of antibiotics.  Take second tablet 3 days later.  ? FLUTICASONE (FLONASE) 50 MCG/ACT NASAL SPRAY    Place 2 sprays into both nostrils daily.  ? GABAPENTIN (NEURONTIN) 100 MG CAPSULE    Take 3 capsules (300 mg total) by mouth at bedtime. Take as needed for hip pain.  ? HYDROCODONE-ACETAMINOPHEN (NORCO) 5-325 MG TABLET    Take 1 tablet by mouth every 6 (six) hours as needed for moderate pain.  ? IBUPROFEN (ADVIL) 800 MG TABLET    Take 1 tablet (800 mg  total) by mouth every 8 (eight) hours as needed.  ? IPRATROPIUM (ATROVENT) 0.06 % NASAL SPRAY    Place 2 sprays into both nostrils 4 (four) times daily. As needed for nasal congestion, runny nose  ? LEVOTHYROXINE (SYNTHROID) 88 MCG TABLET    Take 1 tablet (88 mcg total) by mouth as directed. Half a tablet on sundays and 1 tablet the rest of the week  ? METHOCARBAMOL (ROBAXIN) 500 MG TABLET    Take 1 tablet (500 mg total) by mouth in the morning and at bedtime.  ? NICOTINE (NICODERM CQ - DOSED IN MG/24 HOURS) 14 MG/24HR PATCH    Place 1 patch (14 mg total) onto the skin daily.  ? PANTOPRAZOLE (PROTONIX) 40 MG TABLET    Take 1 tablet (40 mg total) by mouth 2 (two) times daily.  ? TRAMADOL (ULTRAM) 50 MG TABLET    Take 1 tablet (50 mg total) by mouth every 6 (six) hours as needed.  ? TRIAMCINOLONE OINTMENT (KENALOG) 0.1 %    Apply 1 application. topically 2 (two) times daily.  ? VITAMIN D, ERGOCALCIFEROL, (DRISDOL) 1.25 MG (50000 UNIT) CAPS CAPSULE    Take 1 capsule (  50,000 Units total) by mouth every 7 (seven) days.  ?Modified Medications  ? No medications on file  ?Discontinued Medications  ? No medications on file  ?   ? ?Past Medical History:  ?Diagnosis Date  ? Anxiety DX 2011  ? Depression DX 1990  ? Granulomatous lung disease ([place])   ? Grave's disease   ? Graves Disease  ? History of radioactive iodine thyroid ablation   ? 02/21/2019  ? Menometrorrhagia   ? Migraines   ? ? ?Social History  ? ?Tobacco Use  ? Smoking status: Some Days  ?  Packs/day: 0.50  ?  Years: 32.00  ?  Pack years: 16.00  ?  Types: Cigarettes  ? Smokeless tobacco: Never  ?Vaping Use  ? Vaping Use: Never used  ?Substance Use Topics  ? Alcohol use: Yes  ?  Comment: occasional beer  ? Drug use: No  ? ? ?Family History  ?Problem Relation Age of Onset  ? Hypertension Mother   ? Heart attack Mother   ? Hypertension Brother   ? Hypertension Father   ? Allergies Father   ? Allergies Other   ?     children  ? Breast cancer Maternal Aunt   ? Breast  cancer Maternal Aunt   ? ? ?No Known Allergies ? ?Review of Systems  ?All other systems reviewed and are negative. Except as noted above.  ? ? ? ?OBJECTIVE:   ? ?Vitals:  ? 09/08/21 1459  ?BP: (!) 153/82  ?Pulse: 85  ?Temp: 98.1 ?F (36.7 ?C)  ?TempSrc: Oral  ?Weight: 154 lb (69.9 kg)  ?   ?Body mass index is 24.12 kg/m?. ? ?Physical Exam ?Constitutional:   ?   General: She is not in acute distress. ?   Appearance: Normal appearance.  ?HENT:  ?   Head: Normocephalic and atraumatic.  ?Eyes:  ?   Extraocular Movements: Extraocular movements intact.  ?   Conjunctiva/sclera: Conjunctivae normal.  ?Pulmonary:  ?   Effort: Pulmonary effort is normal. No respiratory distress.  ?Musculoskeletal:     ?   General: Normal range of motion.  ?   Cervical back: Normal range of motion and neck supple.  ?Skin: ?   General: Skin is warm and dry.  ?   Findings: No rash.  ?Neurological:  ?   General: No focal deficit present.  ?   Mental Status: She is alert and oriented to person, place, and time.  ?Psychiatric:     ?   Mood and Affect: Mood normal.     ?   Behavior: Behavior normal.  ? ? ? ?Labs and Microbiology: ? ? ?  Latest Ref Rng & Units 07/23/2021  ?  8:19 PM 08/19/2020  ? 12:33 PM 02/25/2020  ?  3:02 PM  ?CBC  ?WBC 4.0 - 10.5 K/uL 10.2   5.3   4.5    ?Hemoglobin 12.0 - 15.0 g/dL 11.8   12.0   11.9    ?Hematocrit 36.0 - 46.0 % 37.3   36.6   36.3    ?Platelets 150 - 400 K/uL 357   328.0   350    ? ? ?  Latest Ref Rng & Units 08/11/2021  ? 11:21 AM 07/23/2021  ?  8:19 PM 08/19/2020  ? 12:33 PM  ?CMP  ?Glucose 70 - 99 mg/dL  94   87    ?BUN 6 - 20 mg/dL  19   18    ?Creatinine 0.44 - 1.00 mg/dL  0.58   0.90    ?Sodium 135 - 145 mmol/L  141   139    ?Potassium 3.5 - 5.1 mmol/L  3.6   3.9    ?Chloride 98 - 111 mmol/L  103   105    ?CO2 22 - 32 mmol/L  29   27    ?Calcium 8.9 - 10.3 mg/dL  9.9   9.5    ?Total Protein 6.0 - 8.5 g/dL 7.5    7.2    ?Total Bilirubin 0.0 - 1.2 mg/dL <0.2    0.3    ?Alkaline Phos 44 - 121 IU/L 82    68    ?AST  0 - 40 IU/L 11    11    ?ALT 0 - 32 IU/L 8    9    ?  ? ? ?ASSESSMENT & PLAN:   ? ?Syphilis ?Discussed with patient her labs suggest possible prior syphilis infection at some point with positive RPR and low level titer of 1:1.  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  She reports no prior syphilis testing or treatment in her lifetime.  Discussed with patient that it is possible her RPR was falsely positive as no confirmatory treponemal testing was done.  Will repeat syphilis testing with confirmatory treponemal test today.  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  Will also check HIV today.  RTC based on results of syphilis testing sent today. ? ?Screening for HIV (human immunodeficiency virus) ?Will check HIV today in setting of possible old syphilis infection for completeness.  ? ?Hand, foot and mouth disease ?This appears to have been the explanation for her rash and has since resolved. No further treatment needed.  ? ? ?Orders Placed This Encounter  ?Procedures  ? RPR  ? Fluorescent treponemal ab(fta)-IgG-bld  ? HIV Antibody (routine testing w rflx)  ?  ? ? ? ? ?Mignon Pine ?[facility] ?[facility] ?09/08/2021, 3:24 PM ? ?  ? ? ?

## 2021-09-08 NOTE — Assessment & Plan Note (Signed)
Will check HIV today in setting of possible old syphilis infection for completeness.  ?

## 2021-09-08 NOTE — Assessment & Plan Note (Signed)
This appears to have been the explanation for her rash and has since resolved. No further treatment needed.  ?

## 2021-09-08 NOTE — Patient Instructions (Signed)
Thank you for coming to see me today. It was a pleasure seeing you. ? ?To Do: ?Labs today ?I will update you on the results to determine if treatment is needed.  ? ?If you have any questions or concerns, please do not hesitate to call the office at [phone]. ? ?Take Care,  ? ?Jule Ser ? ?

## 2021-09-10 LAB — HIV ANTIBODY (ROUTINE TESTING W REFLEX): HIV 1&2 Ab, 4th Generation: NONREACTIVE

## 2021-09-10 LAB — FLUORESCENT TREPONEMAL AB(FTA)-IGG-BLD: Fluorescent Treponemal ABS: NONREACTIVE

## 2021-09-10 LAB — RPR TITER: RPR Titer: 1:1 {titer} — ABNORMAL HIGH

## 2021-09-10 LAB — RPR: RPR Ser Ql: REACTIVE — AB

## 2021-09-13 ENCOUNTER — Telehealth: Payer: Self-pay

## 2021-09-13 NOTE — Telephone Encounter (Signed)
-----   Message from Mignon Pine, DO sent at 09/13/2021  6:38 AM EDT ----- ?Can you please let patient know that her repeat syphilis testing appears consistent with a false positive RPR and I do not think she needs any further treatment. ? ?Thanks ?

## 2021-09-13 NOTE — Telephone Encounter (Signed)
Called patient to relay results, no answer. Left message stating that MyChart message will be sent and to please call with any questions.  ? ?Beryle Flock, RN ? ?

## 2021-09-22 ENCOUNTER — Telehealth: Payer: Self-pay | Admitting: Orthopaedic Surgery

## 2021-09-22 NOTE — Telephone Encounter (Signed)
FMLA forms received. To [employer]. 

## 2021-09-27 ENCOUNTER — Other Ambulatory Visit: Payer: Self-pay

## 2021-10-11 ENCOUNTER — Other Ambulatory Visit: Payer: Self-pay | Admitting: Physician Assistant

## 2021-10-11 DIAGNOSIS — M1611 Unilateral primary osteoarthritis, right hip: Secondary | ICD-10-CM

## 2021-10-20 NOTE — Patient Instructions (Signed)
DUE TO COVID-19 ONLY TWO VISITORS  (aged 58 and older)  ARE ALLOWED TO COME WITH YOU AND STAY IN THE WAITING ROOM ONLY DURING PRE OP AND PROCEDURE.   **NO VISITORS ARE ALLOWED IN THE SHORT STAY AREA OR RECOVERY ROOM!!**  IF YOU WILL BE ADMITTED INTO THE HOSPITAL YOU ARE ALLOWED ONLY FOUR SUPPORT PEOPLE DURING VISITATION HOURS ONLY (7 AM -8PM)   The support person(s) must pass our screening, gel in and out, and wear a mask at all times, including in the patient's room. Patients must also wear a mask when staff or their support person are in the room. Visitors GUEST BADGE MUST BE WORN VISIBLY  One adult visitor may remain with you overnight and MUST be in the room by 8 P.M.     Your procedure is scheduled on: 11/04/21   Report to [facility] Main Entrance    Report to admitting at: 9:00 AM   Call this number if you have problems the morning of surgery [phone]   Do not eat food :After Midnight.   After Midnight you may have the following liquids until : 8:30 AM DAY OF SURGERY  Water Black Coffee (sugar ok, NO MILK/CREAM OR CREAMERS)  Tea (sugar ok, NO MILK/CREAM OR CREAMERS) regular and decaf                             Plain Jell-O (NO RED)                                           Fruit ices (not with fruit pulp, NO RED)                                     Popsicles (NO RED)                                                                  Juice: apple, WHITE grape, WHITE cranberry Sports drinks like Gatorade (NO RED) Clear broth(vegetable,chicken,beef)  Drink G2 drink AT : 8:30 AM the day of surgery.      The day of surgery:  Drink ONE (1) Pre-Surgery Clear Ensure or G2 at AM the morning of surgery. Drink in one sitting. Do not sip.  This drink was given to you during your hospital  pre-op appointment visit. Nothing else to drink after completing the  Pre-Surgery Clear Ensure or G2.          If you have questions, please contact your surgeon's office.   Oral  Hygiene is also important to reduce your risk of infection.                                    Remember - BRUSH YOUR TEETH THE MORNING OF SURGERY WITH YOUR REGULAR TOOTHPASTE   Do NOT smoke after Midnight   Take these medicines the morning of surgery with A SIP OF WATER:fexofenadine,famotidine,pantoprazole.Use inhalers and Flonase as usual.   DO  NOT TAKE ANY ORAL DIABETIC MEDICATIONS DAY OF YOUR SURGERY  Bring CPAP mask and tubing day of surgery.                              You may not have any metal on your body including hair pins, jewelry, and body piercing             Do not wear make-up, lotions, powders, perfumes/cologne, or deodorant  Do not wear nail polish including gel and S&S, artificial/acrylic nails, or any other type of covering on natural nails including finger and toenails. If you have artificial nails, gel coating, etc. that needs to be removed by a nail salon please have this removed prior to surgery or surgery may need to be canceled/ delayed if the surgeon/ anesthesia feels like they are unable to be safely monitored.   Do not shave  48 hours prior to surgery.    Do not bring valuables to the hospital. [place].   Contacts, dentures or bridgework may not be worn into surgery.   Bring small overnight bag day of surgery.   DO NOT [place]. PHARMACY WILL DISPENSE MEDICATIONS LISTED ON YOUR MEDICATION LIST TO YOU DURING YOUR ADMISSION [place]!    Patients discharged on the day of surgery will not be allowed to drive home.  Someone NEEDS to stay with you for the first 24 hours after anesthesia.   Special Instructions: Bring a copy of your healthcare power of attorney and living will documents         the day of surgery if you haven't scanned them before.              Please read over the following fact sheets you were given: IF YOU HAVE QUESTIONS ABOUT YOUR PRE-OP INSTRUCTIONS  PLEASE CALL [phone]     [facility] - Preparing for Surgery Before surgery, you can play an important role.  Because skin is not sterile, your skin needs to be as free of germs as possible.  You can reduce the number of germs on your skin by washing with CHG (chlorahexidine gluconate) soap before surgery.  CHG is an antiseptic cleaner which kills germs and bonds with the skin to continue killing germs even after washing. Please DO NOT use if you have an allergy to CHG or antibacterial soaps.  If your skin becomes reddened/irritated stop using the CHG and inform your nurse when you arrive at Short Stay. Do not shave (including legs and underarms) for at least 48 hours prior to the first CHG shower.  You may shave your face/neck. Please follow these instructions carefully:  1.  Shower with CHG Soap the night before surgery and the  morning of Surgery.  2.  If you choose to wash your hair, wash your hair first as usual with your  normal  shampoo.  3.  After you shampoo, rinse your hair and body thoroughly to remove the  shampoo.                           4.  Use CHG as you would any other liquid soap.  You can apply chg directly  to the skin and wash  Gently with a scrungie or clean washcloth.  5.  Apply the CHG Soap to your body ONLY FROM THE NECK DOWN.   Do not use on face/ open                           Wound or open sores. Avoid contact with eyes, ears mouth and genitals (private parts).                       Wash face,  Genitals (private parts) with your normal soap.             6.  Wash thoroughly, paying special attention to the area where your surgery  will be performed.  7.  Thoroughly rinse your body with warm water from the neck down.  8.  DO NOT shower/wash with your normal soap after using and rinsing off  the CHG Soap.                9.  Pat yourself dry with a clean towel.            10.  Wear clean pajamas.            11.  Place clean sheets on your bed the  night of your first shower and do not  sleep with pets. Day of Surgery : Do not apply any lotions/deodorants the morning of surgery.  Please wear clean clothes to the hospital/surgery center.  FAILURE TO FOLLOW THESE INSTRUCTIONS MAY RESULT IN THE CANCELLATION OF YOUR SURGERY PATIENT SIGNATURE_________________________________  NURSE SIGNATURE__________________________________  ________________________________________________________________________   Adam Phenix  An incentive spirometer is a tool that can help keep your lungs clear and active. This tool measures how well you are filling your lungs with each breath. Taking long deep breaths may help reverse or decrease the chance of developing breathing (pulmonary) problems (especially infection) following: A long period of time when you are unable to move or be active. BEFORE THE PROCEDURE  If the spirometer includes an indicator to show your best effort, your nurse or respiratory therapist will set it to a desired goal. If possible, sit up straight or lean slightly forward. Try not to slouch. Hold the incentive spirometer in an upright position. INSTRUCTIONS FOR USE  Sit on the edge of your bed if possible, or sit up as far as you can in bed or on a chair. Hold the incentive spirometer in an upright position. Breathe out normally. Place the mouthpiece in your mouth and seal your lips tightly around it. Breathe in slowly and as deeply as possible, raising the piston or the ball toward the top of the column. Hold your breath for 3-5 seconds or for as long as possible. Allow the piston or ball to fall to the bottom of the column. Remove the mouthpiece from your mouth and breathe out normally. Rest for a few seconds and repeat Steps 1 through 7 at least 10 times every 1-2 hours when you are awake. Take your time and take a few normal breaths between deep breaths. The spirometer may include an indicator to show your best effort. Use  the indicator as a goal to work toward during each repetition. After each set of 10 deep breaths, practice coughing to be sure your lungs are clear. If you have an incision (the cut made at the time of surgery), support your incision when coughing by placing a pillow or rolled up towels  firmly against it. Once you are able to get out of bed, walk around indoors and cough well. You may stop using the incentive spirometer when instructed by your caregiver.  RISKS AND COMPLICATIONS Take your time so you do not get dizzy or light-headed. If you are in pain, you may need to take or ask for pain medication before doing incentive spirometry. It is harder to take a deep breath if you are having pain. AFTER USE Rest and breathe slowly and easily. It can be helpful to keep track of a log of your progress. Your caregiver can provide you with a simple table to help with this. If you are using the spirometer at home, follow these instructions: [place] IF:  You are having difficultly using the spirometer. You have trouble using the spirometer as often as instructed. Your pain medication is not giving enough relief while using the spirometer. You develop fever of 100.5 F (38.1 C) or higher. SEEK IMMEDIATE MEDICAL CARE IF:  You cough up bloody sputum that had not been present before. You develop fever of 102 F (38.9 C) or greater. You develop worsening pain at or near the incision site. MAKE SURE YOU:  Understand these instructions. Will watch your condition. Will get help right away if you are not doing well or get worse. Document Released: 08/28/2006 Document Revised: 07/10/2011 Document Reviewed: 10/29/2006 [facility] Patient Information 2014 [place], [place].   ________________________________________________________________________

## 2021-10-25 ENCOUNTER — Encounter (HOSPITAL_COMMUNITY)
Admission: RE | Admit: 2021-10-25 | Discharge: 2021-10-25 | Disposition: A | Discharge status: Home / Self Care | Payer: 59 | Source: Ambulatory Visit | Attending: Orthopaedic Surgery | Admitting: Orthopaedic Surgery

## 2021-10-25 ENCOUNTER — Other Ambulatory Visit: Payer: Self-pay

## 2021-10-25 ENCOUNTER — Encounter (HOSPITAL_COMMUNITY): Payer: Self-pay

## 2021-10-25 VITALS — BP 136/98 | HR 71 | Temp 98.1°F | Ht 68.5 in | Wt 149.0 lb

## 2021-10-25 DIAGNOSIS — M1611 Unilateral primary osteoarthritis, right hip: Secondary | ICD-10-CM | POA: Diagnosis not present

## 2021-10-25 DIAGNOSIS — Z01812 Encounter for preprocedural laboratory examination: Secondary | ICD-10-CM | POA: Diagnosis present

## 2021-10-25 DIAGNOSIS — R7303 Prediabetes: Secondary | ICD-10-CM | POA: Diagnosis not present

## 2021-10-25 DIAGNOSIS — Z01818 Encounter for other preprocedural examination: Secondary | ICD-10-CM

## 2021-10-25 HISTORY — DX: Unspecified osteoarthritis, unspecified site: M19.90

## 2021-10-25 HISTORY — DX: Pneumonia, unspecified organism: J18.9

## 2021-10-25 LAB — CBC
HCT: 36 % (ref 36.0–46.0)
Hemoglobin: 11.7 g/dL — ABNORMAL LOW (ref 12.0–15.0)
MCH: 28.6 pg (ref 26.0–34.0)
MCHC: 32.5 g/dL (ref 30.0–36.0)
MCV: 88 fL (ref 80.0–100.0)
Platelets: 347 10*3/uL (ref 150–400)
RBC: 4.09 MIL/uL (ref 3.87–5.11)
RDW: 14.8 % (ref 11.5–15.5)
WBC: 5.2 10*3/uL (ref 4.0–10.5)
nRBC: 0 % (ref 0.0–0.2)

## 2021-10-25 LAB — SURGICAL PCR SCREEN
MRSA, PCR: NEGATIVE
Staphylococcus aureus: NEGATIVE

## 2021-10-26 LAB — HEMOGLOBIN A1C
Hgb A1c MFr Bld: 5.7 % — ABNORMAL HIGH (ref 4.8–5.6)
Mean Plasma Glucose: 117 mg/dL

## 2021-11-03 ENCOUNTER — Telehealth: Payer: Self-pay | Admitting: Orthopaedic Surgery

## 2021-11-03 NOTE — Telephone Encounter (Signed)
Pt submitted medical release form, FMLA Forms, and $25.00 cash payment to [employer]. Accepted 11/03/21

## 2021-11-03 NOTE — H&P (Signed)
TOTAL HIP ADMISSION H&P  Patient is admitted for right total hip arthroplasty.  Subjective:  Chief Complaint: right hip pain  HPI: [name], 56 y.o. female, has a history of pain and functional disability in the right hip(s) due to arthritis and patient has failed non-surgical conservative treatments for greater than 12 weeks to include NSAID's and/or analgesics, corticosteriod injections, flexibility and strengthening excercises, use of assistive devices, and activity modification.  Onset of symptoms was gradual starting 3 years ago with gradually worsening course since that time.The patient noted no past surgery on the right hip(s).  Patient currently rates pain in the right hip at 10 out of 10 with activity. Patient has night pain, worsening of pain with activity and weight bearing, pain that interfers with activities of daily living, and pain with passive range of motion. Patient has evidence of subchondral sclerosis, periarticular osteophytes, and joint space narrowing by imaging studies. This condition presents safety issues increasing the risk of falls.  There is no current active infection.  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   COPD, mild ([place]) 08/06/2010   Graves' disease 06/03/2010   MIGRAINE HEADACHE 11/12/2008   ROTATOR CUFF TEAR 11/12/2008   DEPRESSION, RECURRENT 05/12/2008   ANXIETY DISORDER, GENERALIZED 02/05/2007   TOBACCO ABUSE 02/05/2007   Past Medical History:  Diagnosis Date   Anxiety DX  2011   Arthritis    Depression DX 1990   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   Current Outpatient Medications  Medication Sig Dispense Refill Last Dose   ferrous sulfate 325 (65 FE) MG tablet Take 325 mg by mouth 3 (three) times daily with meals.      ibuprofen (ADVIL) 800 MG tablet Take 1 tablet (800 mg total) by mouth every 8 (eight) hours as needed. 60 tablet 3    levothyroxine (SYNTHROID) 88 MCG tablet Take 1 tablet (88 mcg total) by mouth as directed. Half a tablet on sundays and 1 tablet the rest of the week 85 tablet 3    traMADol (ULTRAM) 50 MG tablet Take 1 tablet (50 mg total) by mouth every 6 (six) hours as needed. 30 tablet 0    Vitamin D, Ergocalciferol, (DRISDOL) 1.25 MG (50000 UNIT) CAPS capsule Take 1 capsule (50,000 Units total) by mouth every 7 (seven) days. 16 capsule 0    albuterol (VENTOLIN HFA) 108 (90 Base) MCG/ACT inhaler Inhale 2 puffs into the lungs every 6 (six) hours as needed for wheezing or shortness of breath (Cough). (Patient not taking: Reported on 10/21/2021) 18 g 2 Not Taking   famotidine (PEPCID) 20 MG tablet Take 1 tablet (20 mg total) by mouth daily. (Patient not taking: Reported  on 10/21/2021) 30 tablet 3 Not Taking   fexofenadine (ALLEGRA) 180 MG tablet Take 1 tablet (180 mg total) by mouth daily. (Patient not taking: Reported on 10/21/2021) 180 tablet 0 Not Taking   fluconazole (DIFLUCAN) 150 MG tablet Take 1 tablet on day 4 of antibiotics.  Take second tablet 3 days later. (Patient not taking: Reported on 10/21/2021) 2 tablet 2 Not Taking   fluticasone (FLONASE) 50 MCG/ACT nasal spray Place 2 sprays into both nostrils daily. (Patient not taking:  Reported on 10/21/2021) 32 mL 1 Not Taking   gabapentin (NEURONTIN) 100 MG capsule Take 3 capsules (300 mg total) by mouth at bedtime. Take as needed for hip pain. (Patient not taking: Reported on 10/21/2021) 180 capsule 0 Not Taking   HYDROcodone-acetaminophen (NORCO) 5-325 MG tablet Take 1 tablet by mouth every 6 (six) hours as needed for moderate pain. (Patient not taking: Reported on 10/21/2021) 12 tablet 0 Not Taking   ipratropium (ATROVENT) 0.06 % nasal spray Place 2 sprays into both nostrils 4 (four) times daily. As needed for nasal congestion, runny nose (Patient not taking: Reported on 10/21/2021) 15 mL 2 Not Taking   methocarbamol (ROBAXIN) 500 MG tablet Take 1 tablet (500 mg total) by mouth in the morning and at bedtime. (Patient not taking: Reported on 10/21/2021) 30 tablet 0 Not Taking   nicotine (NICODERM CQ - DOSED IN MG/24 HOURS) 14 mg/24hr patch Place 1 patch (14 mg total) onto the skin daily. (Patient not taking: Reported on 10/21/2021) 28 patch 1 Not Taking   pantoprazole (PROTONIX) 40 MG tablet Take 1 tablet (40 mg total) by mouth 2 (two) times daily. (Patient not taking: Reported on 10/21/2021) 90 tablet 3 Not Taking   triamcinolone ointment (KENALOG) 0.1 % Apply 1 application. topically 2 (two) times daily. (Patient not taking: Reported on 10/21/2021) 80 g 1 Not Taking   No Known Allergies  Social History   Tobacco Use   Smoking status: Some Days    Packs/day: 0.50    Years: 32.00    Total pack years: 16.00    Types: Cigarettes   Smokeless tobacco: Never  Substance Use Topics   Alcohol use: Yes    Comment: occasional beer    Family History  Problem Relation Age of Onset   Hypertension Mother    Heart attack Mother    Hypertension Brother    Hypertension Father    Allergies Father    Allergies Other        children   Breast cancer Maternal Aunt    Breast cancer Maternal Aunt      Review of Systems  Musculoskeletal:  Positive for gait problem.  All other systems  reviewed and are negative.   Objective:  Physical Exam Vitals reviewed.  Constitutional:      Appearance: Normal appearance.  HENT:     Head: Normocephalic and atraumatic.  Eyes:     Extraocular Movements: Extraocular movements intact.     Pupils: Pupils are equal, round, and reactive to light.  Cardiovascular:     Rate and Rhythm: Normal rate and regular rhythm.  Pulmonary:     Effort: Pulmonary effort is normal.     Breath sounds: Normal breath sounds.  Abdominal:     Palpations: Abdomen is soft.  Musculoskeletal:     Cervical back: Normal range of motion and neck supple.     Right hip: Tenderness and bony tenderness present. Decreased range of motion. Decreased strength.  Neurological:     Mental Status:  She is alert and oriented to person, place, and time.  Psychiatric:        Behavior: Behavior normal.     Vital signs in last 24 hours:    Labs:   Estimated body mass index is 22.33 kg/m as calculated from the following:   Height as of 10/25/21: 5' 8.5" (1.74 m).   Weight as of 10/25/21: 67.6 kg.   Imaging Review Plain radiographs demonstrate severe degenerative joint disease of the right hip(s). The bone quality appears to be excellent for age and reported activity level.      Assessment/Plan:  End stage arthritis, right hip(s)  The patient history, physical examination, clinical judgement of the provider and imaging studies are consistent with end stage degenerative joint disease of the right hip(s) and total hip arthroplasty is deemed medically necessary. The treatment options including medical management, injection therapy, arthroscopy and arthroplasty were discussed at length. The risks and benefits of total hip arthroplasty were presented and reviewed. The risks due to aseptic loosening, infection, stiffness, dislocation/subluxation,  thromboembolic complications and other imponderables were discussed.  The patient acknowledged the explanation, agreed to  proceed with the plan and consent was signed. Patient is being admitted for inpatient treatment for surgery, pain control, PT, OT, prophylactic antibiotics, VTE prophylaxis, progressive ambulation and ADL's and discharge planning.The patient is planning to be discharged home with home health services

## 2021-11-04 ENCOUNTER — Other Ambulatory Visit: Payer: Self-pay

## 2021-11-04 ENCOUNTER — Encounter (HOSPITAL_COMMUNITY)
Admission: RE | Disposition: A | Discharge status: Home Health | Payer: Self-pay | Source: Ambulatory Visit | Attending: Orthopaedic Surgery

## 2021-11-04 ENCOUNTER — Observation Stay (HOSPITAL_COMMUNITY)
Admission: RE | Admit: 2021-11-04 | Discharge: 2021-11-07 | Disposition: A | Discharge status: Home Health | Payer: 59 | Source: Ambulatory Visit | Attending: Orthopaedic Surgery | Admitting: Orthopaedic Surgery

## 2021-11-04 ENCOUNTER — Ambulatory Visit (HOSPITAL_COMMUNITY): Payer: 59

## 2021-11-04 ENCOUNTER — Ambulatory Visit (HOSPITAL_COMMUNITY): Payer: 59 | Admitting: Certified Registered"

## 2021-11-04 ENCOUNTER — Encounter (HOSPITAL_COMMUNITY): Payer: Self-pay | Admitting: Orthopaedic Surgery

## 2021-11-04 DIAGNOSIS — E0500 Thyrotoxicosis with diffuse goiter without thyrotoxic crisis or storm: Secondary | ICD-10-CM | POA: Diagnosis present

## 2021-11-04 DIAGNOSIS — M1611 Unilateral primary osteoarthritis, right hip: Secondary | ICD-10-CM | POA: Diagnosis present

## 2021-11-04 DIAGNOSIS — Z79899 Other long term (current) drug therapy: Secondary | ICD-10-CM | POA: Insufficient documentation

## 2021-11-04 DIAGNOSIS — Z87891 Personal history of nicotine dependence: Secondary | ICD-10-CM | POA: Diagnosis not present

## 2021-11-04 DIAGNOSIS — M25551 Pain in right hip: Secondary | ICD-10-CM | POA: Diagnosis present

## 2021-11-04 DIAGNOSIS — F17210 Nicotine dependence, cigarettes, uncomplicated: Secondary | ICD-10-CM | POA: Diagnosis present

## 2021-11-04 DIAGNOSIS — Z96641 Presence of right artificial hip joint: Secondary | ICD-10-CM

## 2021-11-04 DIAGNOSIS — J449 Chronic obstructive pulmonary disease, unspecified: Secondary | ICD-10-CM | POA: Diagnosis not present

## 2021-11-04 DIAGNOSIS — Z79890 Hormone replacement therapy (postmenopausal): Secondary | ICD-10-CM

## 2021-11-04 DIAGNOSIS — F411 Generalized anxiety disorder: Secondary | ICD-10-CM | POA: Diagnosis present

## 2021-11-04 DIAGNOSIS — Z8249 Family history of ischemic heart disease and other diseases of the circulatory system: Secondary | ICD-10-CM

## 2021-11-04 DIAGNOSIS — F32A Depression, unspecified: Secondary | ICD-10-CM | POA: Diagnosis present

## 2021-11-04 HISTORY — PX: TOTAL HIP ARTHROPLASTY: SHX124

## 2021-11-04 LAB — GLUCOSE, CAPILLARY: Glucose-Capillary: 83 mg/dL (ref 70–99)

## 2021-11-04 SURGERY — ARTHROPLASTY, HIP, TOTAL, ANTERIOR APPROACH
Anesthesia: Spinal | Site: Hip | Laterality: Right

## 2021-11-04 MED ORDER — TRANEXAMIC ACID-NACL 1000-0.7 MG/100ML-% IV SOLN
1000.0000 mg | INTRAVENOUS | Status: AC
  Administered 2021-11-04: 1000 mg via INTRAVENOUS
  Filled 2021-11-04: qty 100

## 2021-11-04 MED ORDER — SODIUM CHLORIDE 0.9 % IR SOLN
Status: DC | PRN
  Administered 2021-11-04: 1000 mL

## 2021-11-04 MED ORDER — ONDANSETRON HCL 4 MG/2ML IJ SOLN
INTRAMUSCULAR | Status: AC
  Filled 2021-11-04: qty 2

## 2021-11-04 MED ORDER — ONDANSETRON HCL 4 MG/2ML IJ SOLN: 4.0000 mg | Freq: Once | INTRAMUSCULAR | Status: DC | PRN

## 2021-11-04 MED ORDER — METHOCARBAMOL 500 MG PO TABS
500.0000 mg | ORAL_TABLET | Freq: Four times a day (QID) | ORAL | Status: DC | PRN
  Administered 2021-11-04 – 2021-11-07 (×7): 500 mg via ORAL
  Filled 2021-11-04 (×6): qty 1

## 2021-11-04 MED ORDER — FENTANYL CITRATE (PF) 100 MCG/2ML IJ SOLN
INTRAMUSCULAR | Status: DC | PRN
  Administered 2021-11-04 (×2): 50 ug via INTRAVENOUS

## 2021-11-04 MED ORDER — HYDROMORPHONE HCL 1 MG/ML IJ SOLN
INTRAMUSCULAR | Status: AC
  Filled 2021-11-04: qty 1

## 2021-11-04 MED ORDER — ASPIRIN 81 MG PO CHEW
81.0000 mg | CHEWABLE_TABLET | Freq: Two times a day (BID) | ORAL | Status: DC
  Administered 2021-11-04 – 2021-11-07 (×6): 81 mg via ORAL
  Filled 2021-11-04 (×6): qty 1

## 2021-11-04 MED ORDER — LEVOTHYROXINE SODIUM 88 MCG PO TABS
44.0000 ug | ORAL_TABLET | ORAL | Status: DC
  Administered 2021-11-06 – 2021-11-07 (×2): 44 ug via ORAL
  Filled 2021-11-04 (×2): qty 1

## 2021-11-04 MED ORDER — FENTANYL CITRATE (PF) 100 MCG/2ML IJ SOLN
INTRAMUSCULAR | Status: AC
  Filled 2021-11-04: qty 2

## 2021-11-04 MED ORDER — FERROUS SULFATE 325 (65 FE) MG PO TABS
325.0000 mg | ORAL_TABLET | Freq: Three times a day (TID) | ORAL | Status: DC
  Administered 2021-11-05 – 2021-11-07 (×6): 325 mg via ORAL
  Filled 2021-11-04 (×6): qty 1

## 2021-11-04 MED ORDER — LACTATED RINGERS IV SOLN: INTRAVENOUS | Status: DC

## 2021-11-04 MED ORDER — OXYCODONE HCL 5 MG PO TABS
10.0000 mg | ORAL_TABLET | ORAL | Status: DC | PRN
  Administered 2021-11-05 – 2021-11-06 (×2): 10 mg via ORAL
  Administered 2021-11-06 – 2021-11-07 (×6): 15 mg via ORAL
  Filled 2021-11-04: qty 2
  Filled 2021-11-04 (×3): qty 3
  Filled 2021-11-04 (×2): qty 2
  Filled 2021-11-04 (×3): qty 3

## 2021-11-04 MED ORDER — POVIDONE-IODINE 10 % EX SWAB
2.0000 | Freq: Once | CUTANEOUS | Status: AC
  Administered 2021-11-04: 2 via TOPICAL

## 2021-11-04 MED ORDER — PHENOL 1.4 % MT LIQD: 1.0000 | OROMUCOSAL | Status: DC | PRN

## 2021-11-04 MED ORDER — MENTHOL 3 MG MT LOZG
1.0000 | LOZENGE | OROMUCOSAL | Status: DC | PRN
Start: 2021-11-04 — End: 2021-11-07
  Filled 2021-11-04: qty 9

## 2021-11-04 MED ORDER — ACETAMINOPHEN 325 MG PO TABS
325.0000 mg | ORAL_TABLET | Freq: Four times a day (QID) | ORAL | Status: DC | PRN
  Administered 2021-11-05 – 2021-11-07 (×4): 650 mg via ORAL
  Filled 2021-11-04 (×4): qty 2

## 2021-11-04 MED ORDER — MIDAZOLAM HCL 5 MG/5ML IJ SOLN
INTRAMUSCULAR | Status: DC | PRN
  Administered 2021-11-04: 2 mg via INTRAVENOUS

## 2021-11-04 MED ORDER — PANTOPRAZOLE SODIUM 40 MG PO TBEC
40.0000 mg | DELAYED_RELEASE_TABLET | Freq: Every day | ORAL | Status: DC
  Administered 2021-11-05 – 2021-11-07 (×3): 40 mg via ORAL
  Filled 2021-11-04 (×3): qty 1

## 2021-11-04 MED ORDER — METHOCARBAMOL 500 MG IVPB - SIMPLE MED: 500.0000 mg | Freq: Four times a day (QID) | INTRAVENOUS | Status: DC | PRN

## 2021-11-04 MED ORDER — OXYCODONE HCL 5 MG/5ML PO SOLN: 5.0000 mg | Freq: Once | ORAL | Status: AC | PRN

## 2021-11-04 MED ORDER — VITAMIN D (ERGOCALCIFEROL) 1.25 MG (50000 UNIT) PO CAPS: 50000.0000 [IU] | ORAL_CAPSULE | ORAL | Status: DC

## 2021-11-04 MED ORDER — STERILE WATER FOR IRRIGATION IR SOLN
Status: DC | PRN
  Administered 2021-11-04: 2000 mL

## 2021-11-04 MED ORDER — DOCUSATE SODIUM 100 MG PO CAPS
100.0000 mg | ORAL_CAPSULE | Freq: Two times a day (BID) | ORAL | Status: DC
  Administered 2021-11-04 – 2021-11-07 (×6): 100 mg via ORAL
  Filled 2021-11-04 (×6): qty 1

## 2021-11-04 MED ORDER — ONDANSETRON HCL 4 MG PO TABS: 4.0000 mg | ORAL_TABLET | Freq: Four times a day (QID) | ORAL | Status: DC | PRN

## 2021-11-04 MED ORDER — ORAL CARE MOUTH RINSE: 15.0000 mL | Freq: Once | OROMUCOSAL | Status: AC

## 2021-11-04 MED ORDER — MIDAZOLAM HCL 2 MG/2ML IJ SOLN
INTRAMUSCULAR | Status: AC
  Filled 2021-11-04: qty 2

## 2021-11-04 MED ORDER — OXYCODONE HCL 5 MG PO TABS
5.0000 mg | ORAL_TABLET | Freq: Once | ORAL | Status: AC | PRN
  Administered 2021-11-04: 5 mg via ORAL

## 2021-11-04 MED ORDER — ONDANSETRON HCL 4 MG/2ML IJ SOLN: 4.0000 mg | Freq: Four times a day (QID) | INTRAMUSCULAR | Status: DC | PRN

## 2021-11-04 MED ORDER — HYDROMORPHONE HCL 1 MG/ML IJ SOLN
0.2500 mg | INTRAMUSCULAR | Status: DC | PRN
  Administered 2021-11-04 (×4): 0.5 mg via INTRAVENOUS

## 2021-11-04 MED ORDER — DIPHENHYDRAMINE HCL 12.5 MG/5ML PO ELIX: 12.5000 mg | ORAL_SOLUTION | ORAL | Status: DC | PRN

## 2021-11-04 MED ORDER — CHLORHEXIDINE GLUCONATE 0.12 % MT SOLN
15.0000 mL | Freq: Once | OROMUCOSAL | Status: AC
  Administered 2021-11-04: 15 mL via OROMUCOSAL

## 2021-11-04 MED ORDER — PROPOFOL 1000 MG/100ML IV EMUL
INTRAVENOUS | Status: AC
  Filled 2021-11-04: qty 100

## 2021-11-04 MED ORDER — PROPOFOL 500 MG/50ML IV EMUL
INTRAVENOUS | Status: DC | PRN
  Administered 2021-11-04: 75 ug/kg/min via INTRAVENOUS
  Administered 2021-11-04: 30 mg via INTRAVENOUS
  Administered 2021-11-04: 10 mg via INTRAVENOUS

## 2021-11-04 MED ORDER — BUPIVACAINE IN DEXTROSE 0.75-8.25 % IT SOLN
INTRATHECAL | Status: DC | PRN
  Administered 2021-11-04: 1.8 mL via INTRATHECAL

## 2021-11-04 MED ORDER — LEVOTHYROXINE SODIUM 88 MCG PO TABS
88.0000 ug | ORAL_TABLET | ORAL | Status: DC
  Administered 2021-11-05 – 2021-11-07 (×2): 88 ug via ORAL
  Filled 2021-11-04 (×2): qty 1

## 2021-11-04 MED ORDER — CEFAZOLIN SODIUM-DEXTROSE 1-4 GM/50ML-% IV SOLN
1.0000 g | Freq: Four times a day (QID) | INTRAVENOUS | Status: AC
  Administered 2021-11-04 – 2021-11-05 (×2): 1 g via INTRAVENOUS
  Filled 2021-11-04 (×2): qty 50

## 2021-11-04 MED ORDER — ONDANSETRON HCL 4 MG/2ML IJ SOLN
INTRAMUSCULAR | Status: DC | PRN
  Administered 2021-11-04: 4 mg via INTRAVENOUS

## 2021-11-04 MED ORDER — METOCLOPRAMIDE HCL 5 MG PO TABS: 5.0000 mg | ORAL_TABLET | Freq: Three times a day (TID) | ORAL | Status: DC | PRN

## 2021-11-04 MED ORDER — DEXAMETHASONE SODIUM PHOSPHATE 10 MG/ML IJ SOLN
INTRAMUSCULAR | Status: AC
  Filled 2021-11-04: qty 1

## 2021-11-04 MED ORDER — OXYCODONE HCL 5 MG PO TABS
5.0000 mg | ORAL_TABLET | ORAL | Status: DC | PRN
  Administered 2021-11-04 – 2021-11-05 (×5): 10 mg via ORAL
  Filled 2021-11-04 (×4): qty 2

## 2021-11-04 MED ORDER — CEFAZOLIN SODIUM-DEXTROSE 2-4 GM/100ML-% IV SOLN
2.0000 g | INTRAVENOUS | Status: AC
  Administered 2021-11-04: 2 g via INTRAVENOUS
  Filled 2021-11-04: qty 100

## 2021-11-04 MED ORDER — METOCLOPRAMIDE HCL 5 MG/ML IJ SOLN: 5.0000 mg | Freq: Three times a day (TID) | INTRAMUSCULAR | Status: DC | PRN

## 2021-11-04 MED ORDER — ALUM & MAG HYDROXIDE-SIMETH 200-200-20 MG/5ML PO SUSP: 30.0000 mL | ORAL | Status: DC | PRN

## 2021-11-04 MED ORDER — OXYCODONE HCL 5 MG PO TABS
ORAL_TABLET | ORAL | Status: AC
  Filled 2021-11-04: qty 1

## 2021-11-04 MED ORDER — SODIUM CHLORIDE 0.9 % IV SOLN: INTRAVENOUS | Status: DC

## 2021-11-04 MED ORDER — METHOCARBAMOL 500 MG PO TABS
ORAL_TABLET | ORAL | Status: AC
  Filled 2021-11-04: qty 1

## 2021-11-04 MED ORDER — 0.9 % SODIUM CHLORIDE (POUR BTL) OPTIME
TOPICAL | Status: DC | PRN
  Administered 2021-11-04: 1000 mL

## 2021-11-04 MED ORDER — HYDROMORPHONE HCL 1 MG/ML IJ SOLN
0.5000 mg | INTRAMUSCULAR | Status: DC | PRN
  Administered 2021-11-05: 1 mg via INTRAVENOUS
  Filled 2021-11-04: qty 1

## 2021-11-04 MED ORDER — DEXAMETHASONE SODIUM PHOSPHATE 10 MG/ML IJ SOLN
INTRAMUSCULAR | Status: DC | PRN
  Administered 2021-11-04: 10 mg via INTRAVENOUS

## 2021-11-04 SURGICAL SUPPLY — 46 items
APL SKNCLS STERI-STRIP NONHPOA (GAUZE/BANDAGES/DRESSINGS)
BAG COUNTER SPONGE SURGICOUNT (BAG) ×3 IMPLANT
BAG SPEC THK2 15X12 ZIP CLS (MISCELLANEOUS) ×1
BAG SPNG CNTER NS LX DISP (BAG) ×1
BAG ZIPLOCK 12X15 (MISCELLANEOUS) ×1 IMPLANT
BALL HIP CERAMIC (Hips) IMPLANT
BENZOIN TINCTURE PRP APPL 2/3 (GAUZE/BANDAGES/DRESSINGS) IMPLANT
BLADE SAW SGTL 18X1.27X75 (BLADE) ×3 IMPLANT
COVER PERINEAL POST (MISCELLANEOUS) ×3 IMPLANT
COVER SURGICAL LIGHT HANDLE (MISCELLANEOUS) ×3 IMPLANT
CUP SECTOR GRIPTON 50MM (Cup) ×1 IMPLANT
DRAPE FOOT SWITCH (DRAPES) ×3 IMPLANT
DRAPE STERI IOBAN 125X83 (DRAPES) ×3 IMPLANT
DRAPE U-SHAPE 47X51 STRL (DRAPES) ×6 IMPLANT
DRSG AQUACEL AG ADV 3.5X10 (GAUZE/BANDAGES/DRESSINGS) ×3 IMPLANT
DURAPREP 26ML APPLICATOR (WOUND CARE) ×3 IMPLANT
ELECT REM PT RETURN 15FT ADLT (MISCELLANEOUS) ×3 IMPLANT
GAUZE XEROFORM 1X8 LF (GAUZE/BANDAGES/DRESSINGS) ×3 IMPLANT
GLOVE BIO SURGEON STRL SZ7.5 (GLOVE) ×3 IMPLANT
GLOVE BIOGEL PI IND STRL 8 (GLOVE) ×4 IMPLANT
GLOVE BIOGEL PI INDICATOR 8 (GLOVE) ×2
GLOVE ECLIPSE 8.0 STRL XLNG CF (GLOVE) ×3 IMPLANT
GOWN STRL REUS W/ TWL XL LVL3 (GOWN DISPOSABLE) ×4 IMPLANT
GOWN STRL REUS W/TWL XL LVL3 (GOWN DISPOSABLE) ×4
HANDPIECE INTERPULSE COAX TIP (DISPOSABLE) ×2
HEAD FEM STD 32X+9 STRL (Hips) ×1 IMPLANT
HIP BALL CERAMIC (Hips) ×2 IMPLANT
HOLDER FOLEY CATH W/STRAP (MISCELLANEOUS) ×3 IMPLANT
KIT TURNOVER KIT A (KITS) ×1 IMPLANT
LINER ACET PNNCL PLUS4 NEUTRAL (Hips) IMPLANT
LINER ACETABULAR 32X50 (Liner) ×1 IMPLANT
PACK ANTERIOR HIP CUSTOM (KITS) ×3 IMPLANT
PINNACLE PLUS 4 NEUTRAL (Hips) ×2 IMPLANT
SET HNDPC FAN SPRY TIP SCT (DISPOSABLE) ×2 IMPLANT
STAPLER VISISTAT 35W (STAPLE) ×1 IMPLANT
STEM FEM ACTIS HIGH SZ3 (Stem) ×1 IMPLANT
STRIP CLOSURE SKIN 1/2X4 (GAUZE/BANDAGES/DRESSINGS) IMPLANT
SUT ETHIBOND NAB CT1 #1 30IN (SUTURE) ×3 IMPLANT
SUT ETHILON 2 0 PS N (SUTURE) IMPLANT
SUT MNCRL AB 4-0 PS2 18 (SUTURE) IMPLANT
SUT VIC AB 0 CT1 36 (SUTURE) ×3 IMPLANT
SUT VIC AB 1 CT1 36 (SUTURE) ×3 IMPLANT
SUT VIC AB 2-0 CT1 27 (SUTURE) ×4
SUT VIC AB 2-0 CT1 TAPERPNT 27 (SUTURE) ×4 IMPLANT
TRAY FOLEY MTR SLVR 16FR STAT (SET/KITS/TRAYS/PACK) ×1 IMPLANT
YANKAUER SUCT BULB TIP NO VENT (SUCTIONS) ×3 IMPLANT

## 2021-11-04 NOTE — Transfer of Care (Signed)
Immediate Anesthesia Transfer of Care Note  Patient: [name]  Procedure(s) Performed: RIGHT TOTAL HIP ARTHROPLASTY ANTERIOR APPROACH (Right: Hip)  Patient Location: PACU  Anesthesia Type:Spinal  Level of Consciousness: awake, alert , oriented and patient cooperative  Airway & Oxygen Therapy: Patient Spontanous Breathing and Patient connected to face mask oxygen  Post-op Assessment: Report given to RN and Post -op Vital signs reviewed and stable  Post vital signs: Reviewed and stable  Last Vitals:  Vitals Value Taken Time  BP 112/78 11/04/21 1515  Temp    Pulse 72 11/04/21 1518  Resp 15 11/04/21 1518  SpO2 98 % 11/04/21 1518  Vitals shown include unvalidated device data.  Last Pain:  Vitals:   11/04/21 0943  TempSrc: Oral         Complications: No notable events documented.

## 2021-11-04 NOTE — Op Note (Unsigned)
NAME: [name], [name] MEDICAL RECORD NO: [id number] ACCOUNT NO: [id number] DATE OF BIRTH: Oct 10, 1965 FACILITY: [facility]: [id number] PHYSICIAN: Lind Guest. Ninfa Linden, MD  Operative Report   DATE OF PROCEDURE: 11/04/2021  PREOPERATIVE DIAGNOSIS:  Primary osteoarthritis and degenerative joint disease, right hip.  POSTOPERATIVE DIAGNOSIS:  Primary osteoarthritis and degenerative joint disease, right hip.  PROCEDURE:  Right total hip arthroplasty through direct anterior approach.  IMPLANTS:  DePuy Sector Gription acetabular component size 50, size 32+4 neutral polyethylene liner, size 3 ACTIS femoral component with high offset, size 32+9 metal hip ball.  SURGEON:  Lind Guest. Ninfa Linden, MD  ASSISTANT:  Benita Stabile, PA-C  ANESTHESIA:  Spinal.  ANTIBIOTICS:  2 g IV Ancef.  ESTIMATED BLOOD LOSS:  250 mL.  COMPLICATIONS:  None.  INDICATIONS:  The patient is a 56 year old female with unfortunately debilitating arthritis involving her right hip.  She has tried and failed all forms of conservative treatment for over 12 months.  At this point, it is detrimentally affecting her  mobility, her quality of life and activities of daily living to the point she does wish to proceed with total hip arthroplasty.  We talked in length and detail about the risk of acute blood loss anemia, nerve or vessel injury, fracture, infection,  dislocation, DVT, implant failure, leg length differences and skin and soft tissue issues.  We talked about our goals being decreased pain, improve mobility and overall improve quality of life.  DESCRIPTION OF PROCEDURE:  After informed consent was obtained, appropriate right hip was marked.  She was brought to the operating room and sat up on the stretcher where spinal anesthesia was obtained.  She was laid in supine position on the stretcher.   Foley catheter was placed and traction boots were placed on both her feet.  She was placed supine on the Hana fracture table  with the perineal post in place and both legs in line skeletal traction device and no traction applied.  Her right operative hip  was prepped and draped in DuraPrep and sterile drapes.  A timeout was called.  She was identified correct patient, correct right hip.  I then made an incision just inferior and posterior to the anterior superior iliac spine and carried this obliquely  down the leg.  We dissected down tensor fascia lata muscle.  Tensor fascia was then divided longitudinally to proceed with direct anterior approach to the hip.  We identified and cauterized circumflex vessels and identified the hip capsule, opened up the  hip capsule in L-type format finding moderate joint effusion.  We placed curved retractors around the medial and lateral femoral neck and made a femoral neck and exposed the femoral neck.  We made a femoral neck cut with oscillating saw just proximal to  the lesser trochanter and completed this with an osteotome.  We then placed a corkscrew guide in the femoral head and removed the femoral head in its entirety and placed a bent Hohmann over the medial acetabular rim and removed remnants of acetabular  labrum and other debris.  We then began reaming under direct visualization from a stepwise increments from a size 43 reamer going up to a size 49 reamer with all reamers placed under direct visualization.  Last reamer was also placed under direct  fluoroscopy, so I could obtain my depth of reaming by inclination and anteversion.  We then placed real DePuy Sector Gription acetabular component size 50 and we went in with a 32+0 neutral polyethylene liner, because that time we  had medialized  her enough based on her offset.  Attention was then turned to the femur.  With the leg externally rotated to 120 degrees and extended and adducted, we were able to place a Mueller retractor medially and Hohmann retractor behind the greater trochanter.   We released lateral joint capsule and used a  box cutting osteotome to enter femoral canal and a rongeur to lateralize.  We then began broaching using the ACTIS broaching system from a size 0 going up to a size 3.  With a size 3 in place, we trialed a  high offset femoral neck and 32+1 hip ball, reduced this in acetabulum and we felt like we just needed a little bit more leg length.  Otherwise, we were pleased with the stability.  We dislocated the hip, removed the trial components.  We placed the real  ACTIS femoral component, size 3 with high offset and we went with a real 32+5 ceramic hip ball and reduced this in the acetabulum, but after that reduction surprisingly did not feel comfortable with the stability and just felt like she needed more  offset and once I externally rotated past 90 degrees, it just felt like I could dislocate it easily, so I made the decision to dislocate her hip and removed the 32+5 ceramic hip ball.  I removed the 32+0 acetabular liner and went with a 32+4 acetabular  liner, to give her more offset and we went with a 32+9 metal hip ball to gave her more leg length.  We reduced this in acetabulum and that one was very pleasing in terms of her range of motion and stability and assessment mechanically and  radiographically.  We then irrigated the soft tissue with normal saline solution.  We closed the remnants of the joint capsule with #1 Ethibond suture followed by #1 Vicryl to close the tensor fascia.  0 Vicryl was used to close the deep tissue and 2-0  Vicryl was used to close the subcutaneous tissue.  The skin was closed with staples.  An Aquacel dressing was applied.  She was taken to recovery room in stable condition with all final counts being correct.  No complications noted.  Of note, Benita Stabile, PA-C, assisted during the entire case and assistance was crucial from beginning to end and medically necessary for retracting soft tissues, helping guide implant placement and layered closure of the wound.   MUK D:  11/04/2021 2:55:56 pm T: 11/04/2021 9:39:00 pm  JOB: [id number]/ [id number]

## 2021-11-04 NOTE — Brief Op Note (Signed)
11/04/2021  2:57 PM  PATIENT:  [name]  56 y.o. female  PRE-OPERATIVE DIAGNOSIS:  OSTEOARTHRITIS / DEGENERATIVE JOINT DISEASE RIGHT HIP  POST-OPERATIVE DIAGNOSIS:  OSTEOARTHRITIS / DEGENERATIVE JOINT   PROCEDURE:  Procedure(s): RIGHT TOTAL HIP ARTHROPLASTY ANTERIOR APPROACH (Right)  SURGEON:  Surgeon(s) and Role:    * Mcarthur Rossetti, MD - Primary  PHYSICIAN ASSISTANT:  Benita Stabile, PA-C   ANESTHESIA:   spinal  EBL:  250 mL   COUNTS:  YES  DICTATION: .Other Dictation: Dictation Number [id number]  PLAN OF CARE: Admit for overnight observation  PATIENT DISPOSITION:  PACU - hemodynamically stable.   Delay start of Pharmacological VTE agent (>24hrs) due to surgical blood loss or risk of bleeding: no

## 2021-11-04 NOTE — Anesthesia Postprocedure Evaluation (Signed)
Anesthesia Post Note  Patient: LAILYNN SOUTHGATE  Procedure(s) Performed: RIGHT TOTAL HIP ARTHROPLASTY ANTERIOR APPROACH (Right: Hip)     Patient location during evaluation: PACU Anesthesia Type: Spinal Level of consciousness: oriented and awake and alert Pain management: pain level controlled Vital Signs Assessment: post-procedure vital signs reviewed and stable Respiratory status: spontaneous breathing, respiratory function stable and nonlabored ventilation Cardiovascular status: blood pressure returned to baseline and stable Postop Assessment: no headache, no backache, no apparent nausea or vomiting, spinal receding and patient able to bend at knees Anesthetic complications: no   No notable events documented.  Last Vitals:  Vitals:   11/04/21 1600 11/04/21 1615  BP: 112/84 121/85  Pulse: 67 69  Resp: 16 14  Temp:    SpO2: 92% 92%    Last Pain:  Vitals:   11/04/21 1530  TempSrc:   PainSc: 6                  Amparo Donalson A.

## 2021-11-04 NOTE — Anesthesia Preprocedure Evaluation (Signed)
Anesthesia Evaluation  Patient identified by MRN, date of birth, ID band Patient awake    Reviewed: Allergy & Precautions, NPO status , Patient's Chart, lab work & pertinent test results, reviewed documented beta blocker date and time   Airway Mallampati: I  TM Distance: >3 FB Neck ROM: Full    Dental no notable dental hx. (+) Teeth Intact, Dental Advisory Given   Pulmonary pneumonia, resolved, COPD, Current Smoker and Patient abstained from smoking.,    Pulmonary exam normal breath sounds clear to auscultation       Cardiovascular negative cardio ROS Normal cardiovascular exam Rhythm:Regular Rate:Normal     Neuro/Psych  Headaches, PSYCHIATRIC DISORDERS Anxiety Depression    GI/Hepatic negative GI ROS, Neg liver ROS,   Endo/Other  Hypothyroidism   Renal/GU negative Renal ROS  negative genitourinary   Musculoskeletal  (+) Arthritis , Osteoarthritis,  OA right hip   Abdominal   Peds  Hematology negative hematology ROS (+)   Anesthesia Other Findings   Reproductive/Obstetrics                             Anesthesia Physical Anesthesia Plan  ASA: 2  Anesthesia Plan: Spinal   Post-op Pain Management: Minimal or no pain anticipated and Regional block*   Induction: Intravenous  PONV Risk Score and Plan: 2 and Treatment may vary due to age or medical condition and Ondansetron  Airway Management Planned: Natural Airway and Simple Face Mask  Additional Equipment: None  Intra-op Plan:   Post-operative Plan: Extubation in OR  Informed Consent: I have reviewed the patients History and Physical, chart, labs and discussed the procedure including the risks, benefits and alternatives for the proposed anesthesia with the patient or authorized representative who has indicated his/her understanding and acceptance.     Dental advisory given  Plan Discussed with: CRNA and  Anesthesiologist  Anesthesia Plan Comments:         Anesthesia Quick Evaluation

## 2021-11-04 NOTE — Interval H&P Note (Signed)
History and Physical Interval Note: The patient understands fully that she is here today for a right hip replacement to treat her right hip osteoarthritis.  There has been no acute or interval change in her medical status.  See recent H&P.  The risks and benefits of surgery been explained in detail and informed consent is obtained.  The right operative hip has been marked.  11/04/2021 11:10 AM  [name]  has presented today for surgery, with the diagnosis of OSTEOARTHRITIS / [place].  The various methods of treatment have been discussed with the patient and family. After consideration of risks, benefits and other options for treatment, the patient has consented to  Procedure(s): RIGHT TOTAL HIP ARTHROPLASTY ANTERIOR APPROACH (Right) as a surgical intervention.  The patient's history has been reviewed, patient examined, no change in status, stable for surgery.  I have reviewed the patient's chart and labs.  Questions were answered to the patient's satisfaction.     Mcarthur Rossetti

## 2021-11-04 NOTE — Anesthesia Procedure Notes (Signed)
Spinal  Patient location during procedure: OR Start time: 11/04/2021 1:21 PM End time: 11/04/2021 1:24 PM Reason for block: surgical anesthesia Staffing Performed: anesthesiologist  Anesthesiologist: Josephine Igo, MD Performed by: Josephine Igo, MD Authorized by: Josephine Igo, MD   Preanesthetic Checklist Completed: patient identified, IV checked, site marked, risks and benefits discussed, surgical consent, monitors and equipment checked, pre-op evaluation and timeout performed Spinal Block Patient position: sitting Prep: DuraPrep and site prepped and draped Patient monitoring: heart rate, cardiac monitor, continuous pulse ox and blood pressure Approach: midline Location: L3-4 Injection technique: single-shot Needle Needle type: Pencan  Needle gauge: 24 G Needle length: 9 cm Needle insertion depth: 6 cm Assessment Sensory level: T4 Events: CSF return Additional Notes Patient tolerated procedure well. Adequate sensory level.

## 2021-11-05 DIAGNOSIS — M1611 Unilateral primary osteoarthritis, right hip: Secondary | ICD-10-CM | POA: Diagnosis not present

## 2021-11-05 LAB — BASIC METABOLIC PANEL
Anion gap: 8 (ref 5–15)
BUN: 20 mg/dL (ref 6–20)
CO2: 25 mmol/L (ref 22–32)
Calcium: 8.5 mg/dL — ABNORMAL LOW (ref 8.9–10.3)
Chloride: 104 mmol/L (ref 98–111)
Creatinine, Ser: 0.64 mg/dL (ref 0.44–1.00)
GFR, Estimated: >60 mL/min (ref >60)
Glucose, Bld: 142 mg/dL — ABNORMAL HIGH (ref 70–99)
Potassium: 4 mmol/L (ref 3.5–5.1)
Sodium: 137 mmol/L (ref 135–145)

## 2021-11-05 LAB — CBC
HCT: 29.2 % — ABNORMAL LOW (ref 36.0–46.0)
Hemoglobin: 9.2 g/dL — ABNORMAL LOW (ref 12.0–15.0)
MCH: 28.9 pg (ref 26.0–34.0)
MCHC: 31.5 g/dL (ref 30.0–36.0)
MCV: 91.8 fL (ref 80.0–100.0)
Platelets: 362 10*3/uL (ref 150–400)
RBC: 3.18 MIL/uL — ABNORMAL LOW (ref 3.87–5.11)
RDW: 15.3 % (ref 11.5–15.5)
WBC: 13.9 10*3/uL — ABNORMAL HIGH (ref 4.0–10.5)
nRBC: 0 % (ref 0.0–0.2)

## 2021-11-05 MED ORDER — ASPIRIN 81 MG PO CHEW: 81.0000 mg | CHEWABLE_TABLET | Freq: Two times a day (BID) | ORAL | 0 refills | Status: AC

## 2021-11-05 MED ORDER — OXYCODONE HCL 5 MG PO TABS: 5.0000 mg | ORAL_TABLET | Freq: Four times a day (QID) | ORAL | 0 refills | Status: DC | PRN

## 2021-11-05 MED ORDER — TIZANIDINE HCL 4 MG PO TABS: 4.0000 mg | ORAL_TABLET | Freq: Three times a day (TID) | ORAL | 0 refills | Status: DC | PRN

## 2021-11-05 NOTE — Discharge Summary (Signed)
Patient ID: [name] MRN: [id number] DOB/AGE: January 18, 1966 56 y.o.  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 female who was admitted 11/04/2021 for operative treatment ofUnilateral primary osteoarthritis, right hip. Patient has severe unremitting pain that affects sleep, daily activities, and work/hobbies. After pre-op clearance the patient was taken to the operating room on 11/04/2021 and underwent  Procedure(s): RIGHT TOTAL HIP ARTHROPLASTY ANTERIOR APPROACH.    Patient was given perioperative antibiotics:  Anti-infectives (From admission, onward)    Start     Dose/Rate Route Frequency Ordered Stop   11/04/21 2200  ceFAZolin (ANCEF) IVPB 1 g/50 mL premix        1 g 100 mL/hr over 30 Minutes Intravenous Every 6 hours 11/04/21 1956 11/05/21 0530   11/04/21 0945  ceFAZolin (ANCEF) IVPB 2g/100 mL premix        2 g 200 mL/hr over 30 Minutes Intravenous On call to O.R. 11/04/21 0941 11/04/21 1322        Patient was given sequential compression devices, early ambulation, and chemoprophylaxis to prevent DVT.  Patient benefited maximally from hospital stay and there were no complications.    Recent vital signs: Patient Vitals for the past 24 hrs:  BP Temp Temp src Pulse Resp SpO2 Height Weight  11/05/21 0504 100/73 98.5 F (36.9  C) Oral 85 17 92 % -- --  11/05/21 0127 102/79 97.7 F (36.5 C) Oral 81 16 95 % -- --  11/04/21 2008 107/79 98.5 F (36.9 C) Oral 91 14 91 % -- --  11/04/21 1930 99/76 -- -- 99 12 91 % -- --  11/04/21 1900 105/81 -- -- 91 10 91 % -- --  11/04/21 1830 (!) 118/91 (!) 97.5 F (36.4 C) -- (!) 104 17 92 % -- --  11/04/21 1800 125/83 -- -- 83 14 93 % -- --  11/04/21 1730 (!) 117/92 -- -- 80 14 93 % -- --  11/04/21 1700 (!) 119/91 -- -- 74 13 92 % -- --  11/04/21 1645 (!) 121/91 -- -- 80 15 92 % -- --  11/04/21 1630 121/89 -- -- 66 12 93 % -- --  11/04/21 1615 121/85 -- -- 69 14 92 % -- --  11/04/21 1600 112/84 -- -- 67 16 92 % -- --  11/04/21 1545 (!) 118/91 -- -- 73 14 93 % -- --  11/04/21 1530 121/83 98 F (36.7 C) -- 65 13 99 % -- --  11/04/21 1518 112/78 97.7 F (36.5 C) -- 78 17 99 % -- --  11/04/21 1027 (!) 151/101 -- -- -- 16 98 % -- --  11/04/21 1012 -- -- -- -- -- -- 5' 8.5" (1.74 m) 67.6 kg  11/04/21 0943 (!) 169/111 -- Oral 67 16 98 % -- --  Recent laboratory studies:  Recent Labs    11/05/21 0338  WBC 13.9*  HGB 9.2*  HCT 29.2*  PLT 362  NA 137  K 4.0  CL 104  CO2 25  BUN 20  CREATININE 0.64  GLUCOSE 142*  CALCIUM 8.5*     Discharge Medications:   Allergies as of 11/05/2021   No Known Allergies      Medication List     STOP taking these medications    methocarbamol 500 MG tablet Commonly known as: ROBAXIN   traMADol 50 MG tablet Commonly known as: ULTRAM       TAKE these medications    aspirin 81 MG chewable tablet Chew 1 tablet (81 mg total) by mouth 2 (two) times daily.   ferrous sulfate 325 (65 FE) MG tablet Take 325 mg by mouth 3 (three) times daily with meals.   ibuprofen 800 MG tablet Commonly known as: ADVIL Take 1 tablet (800 mg total) by mouth every 8 (eight) hours as needed.   levothyroxine 88 MCG tablet Commonly known as: SYNTHROID Take 1 tablet (88 mcg total) by mouth as directed. Half a tablet on 'sundays and 1 tablet  the rest of the week   oxyCODONE 5 MG immediate release tablet Commonly known as: Oxy IR/ROXICODONE Take 1-2 tablets (5-10 mg total) by mouth every 6 (six) hours as needed for moderate pain (pain score 4-6).   tiZANidine 4 MG tablet Commonly known as: Zanaflex Take 1 tablet (4 mg total) by mouth every 8 (eight) hours as needed for muscle spasms.   Vitamin D (Ergocalciferol) 1.25 MG (50000 UNIT) Caps capsule Commonly known as: DRISDOL Take 1 capsule (50,000 Units total) by mouth every 7 (seven) days.               Durable Medical Equipment  (From admission, onward)           Start     Ordered   11/04/21 1957  DME 3 n 1  Once        11/04/21 1956   11/04/21 1957  DME Walker rolling  Once       Question Answer Comment  Walker: With 5 Inch Wheels   Patient needs a walker to treat with the following condition Status post total replacement of right hip      07'$ /07/23 1956            Diagnostic Studies: DG Pelvis Portable  Result Date: 11/04/2021 CLINICAL DATA:  Status post right hip arthroplasty EXAM: PORTABLE PELVIS 1 VIEWS COMPARISON:  Right hip radiograph dated July 29, 2021 FINDINGS: Interval postsurgical changes from right total hip arthroplasty. Arthroplasty components appear in their expected alignment. No periprosthetic fracture is identified. Expected postoperative changes within the overlying soft tissues. IMPRESSION: Postsurgical changes from right total hip arthroplasty. Electronically Signed   By: Yetta Glassman M.D.   On: 11/04/2021 15:48   DG HIP UNILAT WITH PELVIS 1V RIGHT  Result Date: 11/04/2021 CLINICAL DATA:  Right hip intraoperative images. EXAM: DG HIP (WITH OR WITHOUT PELVIS) 1V RIGHT COMPARISON:  Right hip x-ray 07/29/2021 FINDINGS: Intraoperative right hip. 3 low resolution intraoperative spot views of the right hip were obtained. Right hip arthroplasty present in anatomic alignment. No fracture visible on the limited views. Total fluoroscopy  time: 18 seconds Total radiation dose: 2.13 micro Gy IMPRESSION: Intraoperative right hip arthroplasty. Electronically Signed   By: Ronney Asters M.D.   On: 11/04/2021 15:16   DG C-Arm 1-60 Min-No Report  Result  Date: 11/04/2021 Fluoroscopy was utilized by the requesting physician.  No radiographic interpretation.    Disposition: Discharge disposition: 01-Home or Self Care          Follow-up Information     Mcarthur Rossetti, MD Follow up in 2 week(s).   Specialty: Orthopedic Surgery Contact information: [address] [place] [address] [phone]                  Signed: Mcarthur Rossetti 11/05/2021, 8:56 AM

## 2021-11-05 NOTE — Evaluation (Signed)
Physical Therapy Evaluation Patient Details Name: [name] MRN: [id number] DOB: 25-Sep-1965 Today's Date: 11/05/2021  History of Present Illness  56 yo female s/p R DA THA.PMH: pna, Graves dz, syphilis, OA, anxiety  Clinical Impression  Pt is s/p TKA resulting in the deficits listed below (see PT Problem List).  Pt having some issues with pain despite having had all meds prior to PT. Pt was able to mobilize, amb short distance in hallway requiring seated rest d/t fatigue and pain.  Will see again this pm, pt likely not ready to d/c today d/t mobility limitations and pain issues.  Pt will benefit from skilled PT to increase their independence and safety with mobility to allow discharge to the venue listed below.       Recommendations for follow up therapy are one component of a multi-disciplinary discharge planning process, led by the attending physician.  Recommendations may be updated based on patient status, additional functional criteria and insurance authorization.  Follow Up Recommendations Follow physician's recommendations for discharge plan and follow up therapies      Assistance Recommended at Discharge Intermittent Supervision/Assistance  Patient can return home with the following  A little help with walking and/or transfers;Assist for transportation;Help with stairs or ramp for entrance;Assistance with cooking/housework    Equipment Recommendations Rolling walker (2 wheels)  Recommendations for Other Services       Functional Status Assessment Patient has had a recent decline in their functional status and demonstrates the ability to make significant improvements in function in a reasonable and predictable amount of time.     Precautions / Restrictions Precautions Precautions: Fall Restrictions Weight Bearing Restrictions: No Other Position/Activity Restrictions: WBAT      Mobility  Bed Mobility Overal bed mobility: Needs Assistance Bed Mobility: Supine to  Sit     Supine to sit: Min assist     General bed mobility comments: assist with RLE, incr time, cues for technique    Transfers Overall transfer level: Needs assistance Equipment used: Rolling walker (2 wheels) Transfers: Sit to/from Stand Sit to Stand: Min assist, Min guard           General transfer comment: assist with anterior -superior wt shift, cues for hand placement    Ambulation/Gait Ambulation/Gait assistance: Min assist Gait Distance (Feet): 35 Feet Assistive device: Rolling walker (2 wheels) Gait Pattern/deviations: Step-to pattern, Antalgic       General Gait Details: cues for sequence, use of UEs for pain control with WBing RLE  Stairs            Wheelchair Mobility    Modified Rankin (Stroke Patients Only)       Balance                                             Pertinent Vitals/Pain Pain Assessment Pain Assessment: 0-10 Pain Score: 7  Pain Location: right hip Pain Descriptors / Indicators: Grimacing, Sore, Guarding Pain Intervention(s): Limited activity within patient's tolerance, Premedicated before session, Monitored during session, Repositioned    Home Living Family/patient expects to be discharged to:: Private residence Living Arrangements: Spouse/significant other;Children Available Help at Discharge: Family;Available 24 hours/day Type of Home: House Home Access: Stairs to enter   Entrance Stairs-Number of Steps: 1 small Alternate Level Stairs-Number of Steps: flight Home Layout: 1/2 bath on main level;Bed/bath upstairs;Two level Home Equipment: None  Prior Function Prior Level of Function : Independent/Modified Independent                     Hand Dominance        Extremity/Trunk Assessment   Upper Extremity Assessment Upper Extremity Assessment: Overall WFL for tasks assessed    Lower Extremity Assessment Lower Extremity Assessment: RLE deficits/detail RLE Deficits / Details:  ankle WFL, knee and hip testing  limited by pain       Communication   Communication: No difficulties  Cognition Arousal/Alertness: Awake/alert Behavior During Therapy: WFL for tasks assessed/performed Overall Cognitive Status: Within Functional Limits for tasks assessed                                          General Comments      Exercises     Assessment/Plan    PT Assessment Patient needs continued PT services  PT Problem List Decreased strength;Decreased range of motion;Decreased activity tolerance;Decreased balance;Pain;Decreased knowledge of use of DME;Decreased mobility       PT Treatment Interventions DME instruction;Therapeutic exercise;Gait training;Functional mobility training;Therapeutic activities;Patient/family education;Stair training    PT Goals (Current goals can be found in the Care Plan section)  Acute Rehab PT Goals Patient Stated Goal: home PT Goal Formulation: With patient Time For Goal Achievement: 11/12/21 Potential to Achieve Goals: Good    Frequency 7X/week     Co-evaluation               AM-PAC PT "6 Clicks" Mobility  Outcome Measure Help needed turning from your back to your side while in a flat bed without using bedrails?: A Little Help needed moving from lying on your back to sitting on the side of a flat bed without using bedrails?: A Little Help needed moving to and from a bed to a chair (including a wheelchair)?: A Little Help needed standing up from a chair using your arms (e.g., wheelchair or bedside chair)?: A Little Help needed to walk in hospital room?: A Little Help needed climbing 3-5 steps with a railing? : A Lot 6 Click Score: 17    End of Session Equipment Utilized During Treatment: Gait belt Activity Tolerance: Patient tolerated treatment well Patient left: with call bell/phone within reach;in chair;with chair alarm set   PT Visit Diagnosis: Other abnormalities of gait and mobility  (R26.89);Difficulty in walking, not elsewhere classified (R26.2)    Time: 1031-5945 PT Time Calculation (min) (ACUTE ONLY): 25 min   Charges:   PT Evaluation $PT Eval Low Complexity: 1 Low PT Treatments $Gait Training: 8-22 mins        Baxter Flattery, PT  Acute Rehab Dept [facility]) [phone]  [facility] Weekend Pager [facility] only)  [phone]  11/05/2021   [facility] 11/05/2021, 1:01 PM

## 2021-11-05 NOTE — Discharge Instructions (Signed)

## 2021-11-05 NOTE — Plan of Care (Signed)
  Problem: Education: Goal: Knowledge of General Education information will improve Description: Including pain rating scale, medication(s)/side effects and non-pharmacologic comfort measures Outcome: Progressing   Problem: Activity: Goal: Risk for activity intolerance will decrease Outcome: Progressing   Problem: Pain Managment: Goal: General experience of comfort will improve Outcome: Progressing   

## 2021-11-05 NOTE — Progress Notes (Signed)
Subjective: 1 Day Post-Op Procedure(s) (LRB): RIGHT TOTAL HIP ARTHROPLASTY ANTERIOR APPROACH (Right) Patient reports pain as moderate.  Acute on chronic blood loss anemia, but asymptomatic.  Vitals stable.  Objective: Vital signs in last 24 hours: Temp:  [97.5 F (36.4 C)-98.5 F (36.9 C)] 98.5 F (36.9 C) (07/08 0504) Pulse Rate:  [65-104] 85 (07/08 0504) Resp:  [10-17] 17 (07/08 0504) BP: (99-169)/(73-111) 100/73 (07/08 0504) SpO2:  [91 %-99 %] 92 % (07/08 0504) Weight:  [67.6 kg] 67.6 kg (07/07 1012)  Intake/Output from previous day: 07/07 0701 - 07/08 0700 In: 840 [P.O.:240; I.V.:600] Out: 975 [Urine:725; Blood:250] Intake/Output this shift: No intake/output data recorded.  Recent Labs    11/05/21 0338  HGB 9.2*   Recent Labs    11/05/21 0338  WBC 13.9*  RBC 3.18*  HCT 29.2*  PLT 362   Recent Labs    11/05/21 0338  NA 137  K 4.0  CL 104  CO2 25  BUN 20  CREATININE 0.64  GLUCOSE 142*  CALCIUM 8.5*   No results for input(s): "LABPT", "INR" in the last 72 hours.  Sensation intact distally Intact pulses distally Dorsiflexion/Plantar flexion intact Incision: scant drainage   Assessment/Plan: 1 Day Post-Op Procedure(s) (LRB): RIGHT TOTAL HIP ARTHROPLASTY ANTERIOR APPROACH (Right) Up with therapy Discharge home with home health      Mcarthur Rossetti 11/05/2021, 8:52 AM

## 2021-11-05 NOTE — Progress Notes (Signed)
Physical Therapy Treatment Patient Details Name: [name] MRN: [id number] DOB: [birth year] Today's Date: 11/05/2021   History of Present Illness 56 yo female s/p R DA THA.PMH: pna, Graves dz, syphilis, OA, anxiety    PT Comments    Pt very sleepy likely d/t meds however continues to complain of severe pain.  Exercise focused session, pt tol relatively well. Continue PT   Recommendations for follow up therapy are one component of a multi-disciplinary discharge planning process, led by the attending physician.  Recommendations may be updated based on patient status, additional functional criteria and insurance authorization.  Follow Up Recommendations  Follow physician's recommendations for discharge plan and follow up therapies     Assistance Recommended at Discharge Intermittent Supervision/Assistance  Patient can return home with the following A little help with walking and/or transfers;Assist for transportation;Help with stairs or ramp for entrance;Assistance with cooking/housework   Equipment Recommendations  Rolling walker (2 wheels)    Recommendations for Other Services       Precautions / Restrictions Precautions Precautions: Fall Restrictions Weight Bearing Restrictions: No Other Position/Activity Restrictions: WBAT     Mobility  Bed Mobility Overal bed mobility: Needs Assistance Bed Mobility: Supine to Sit     Supine to sit: Min assist     General bed mobility comments:  (pt declined, tired)    Transfers Overall transfer level: Needs assistance Equipment used: Rolling walker (2 wheels) Transfers: Sit to/from Stand Sit to Stand: Min assist, Min guard           General transfer comment: assist with anterior -superior wt shift, cues for hand placement    Ambulation/Gait Ambulation/Gait assistance: Min assist Gait Distance (Feet): 35 Feet Assistive device: Rolling walker (2 wheels) Gait Pattern/deviations: Step-to pattern, Antalgic        General Gait Details: cues for sequence, use of UEs for pain control with WBing RLE   Stairs             Wheelchair Mobility    Modified Rankin (Stroke Patients Only)       Balance                                            Cognition Arousal/Alertness: Awake/alert (very sleepy) Behavior During Therapy: WFL for tasks assessed/performed Overall Cognitive Status: Within Functional Limits for tasks assessed                                          Exercises Total Joint Exercises Ankle Circles/Pumps: AROM, Both, 15 reps Quad Sets: AROM, Both, 10 reps Short Arc Quad: AAROM, AROM, Strengthening, Right, 10 reps Heel Slides: AAROM, Right, 10 reps, Limitations Heel Slides Limitations: pain and guarding Hip ABduction/ADduction: AAROM, Right, 10 reps    General Comments        Pertinent Vitals/Pain Pain Assessment Pain Assessment: Faces Pain Score: 7  Faces Pain Scale: Hurts whole lot Pain Location: right hip Pain Descriptors / Indicators: Grimacing, Sore, Guarding Pain Intervention(s): Limited activity within patient's tolerance, Monitored during session, Premedicated before session, Repositioned    Home Living                          Prior Function  PT Goals (current goals can now be found in the care plan section) Acute Rehab PT Goals Patient Stated Goal: home PT Goal Formulation: With patient Time For Goal Achievement: 11/12/21 Potential to Achieve Goals: Good    Frequency    7X/week      PT Plan Current plan remains appropriate    Co-evaluation              AM-PAC PT "6 Clicks" Mobility   Outcome Measure  Help needed turning from your back to your side while in a flat bed without using bedrails?: A Little Help needed moving from lying on your back to sitting on the side of a flat bed without using bedrails?: A Little Help needed moving to and from a bed to a chair (including a  wheelchair)?: A Little Help needed standing up from a chair using your arms (e.g., wheelchair or bedside chair)?: A Little Help needed to walk in hospital room?: A Little Help needed climbing 3-5 steps with a railing? : A Lot 6 Click Score: 17    End of Session Equipment Utilized During Treatment: Gait belt Activity Tolerance: Patient tolerated treatment well Patient left: in bed;with bed alarm set;with family/visitor present   PT Visit Diagnosis: Other abnormalities of gait and mobility (R26.89);Difficulty in walking, not elsewhere classified (R26.2)     Time: 8768-1157 PT Time Calculation (min) (ACUTE ONLY): 15 min  Charges:  $Gait Training: 8-22 mins $Therapeutic Exercise: 8-22 mins                     Baxter Flattery, PT  Acute Rehab Dept [facility]) [phone]  [facility] Weekend Pager [facility] only)  [phone]  11/05/2021    [facility] 11/05/2021, 4:52 PM

## 2021-11-06 DIAGNOSIS — Z79890 Hormone replacement therapy (postmenopausal): Secondary | ICD-10-CM | POA: Diagnosis not present

## 2021-11-06 DIAGNOSIS — F17210 Nicotine dependence, cigarettes, uncomplicated: Secondary | ICD-10-CM | POA: Diagnosis present

## 2021-11-06 DIAGNOSIS — M1611 Unilateral primary osteoarthritis, right hip: Secondary | ICD-10-CM | POA: Diagnosis not present

## 2021-11-06 DIAGNOSIS — J449 Chronic obstructive pulmonary disease, unspecified: Secondary | ICD-10-CM | POA: Diagnosis present

## 2021-11-06 DIAGNOSIS — F32A Depression, unspecified: Secondary | ICD-10-CM | POA: Diagnosis present

## 2021-11-06 DIAGNOSIS — E0500 Thyrotoxicosis with diffuse goiter without thyrotoxic crisis or storm: Secondary | ICD-10-CM | POA: Diagnosis present

## 2021-11-06 DIAGNOSIS — Z79899 Other long term (current) drug therapy: Secondary | ICD-10-CM | POA: Diagnosis not present

## 2021-11-06 DIAGNOSIS — F411 Generalized anxiety disorder: Secondary | ICD-10-CM | POA: Diagnosis present

## 2021-11-06 DIAGNOSIS — Z8249 Family history of ischemic heart disease and other diseases of the circulatory system: Secondary | ICD-10-CM | POA: Diagnosis not present

## 2021-11-06 LAB — CBC
HCT: 23.7 % — ABNORMAL LOW (ref 36.0–46.0)
Hemoglobin: 7.5 g/dL — ABNORMAL LOW (ref 12.0–15.0)
MCH: 29 pg (ref 26.0–34.0)
MCHC: 31.6 g/dL (ref 30.0–36.0)
MCV: 91.5 fL (ref 80.0–100.0)
Platelets: 245 10*3/uL (ref 150–400)
RBC: 2.59 MIL/uL — ABNORMAL LOW (ref 3.87–5.11)
RDW: 15.1 % (ref 11.5–15.5)
WBC: 7.6 10*3/uL (ref 4.0–10.5)
nRBC: 0 % (ref 0.0–0.2)

## 2021-11-06 LAB — PREPARE RBC (CROSSMATCH)

## 2021-11-06 MED ORDER — SODIUM CHLORIDE 0.9% IV SOLUTION: Freq: Once | INTRAVENOUS | Status: AC

## 2021-11-06 NOTE — Progress Notes (Signed)
11/06/21 1600  PT Visit Information  Last PT Received On 11/06/21  Assistance Needed   Pt progressing slowly. Requiring assist to advance RLE each step amb 15' to bathroom.  Pt finished unit of blood, c/o fatigue and pain. Will continue to follow   History of Present Illness 56 yo female s/p R DA THA.PMH: pna, Graves dz, syphilis, OA, anxiety  Subjective Data  Patient Stated Goal home  Precautions  Precautions Fall  Restrictions  Weight Bearing Restrictions No  Other Position/Activity Restrictions WBAT  Pain Assessment  Pain Assessment 0-10  Pain Score 6  Pain Location right hip  Pain Descriptors / Indicators Grimacing;Sore;Guarding  Pain Intervention(s) Limited activity within patient's tolerance;Monitored during session;Premedicated before session;Repositioned  Cognition  Arousal/Alertness Awake/alert  Behavior During Therapy WFL for tasks assessed/performed  Overall Cognitive Status Within Functional Limits for tasks assessed  Transfers  Overall transfer level Needs assistance  Equipment used Rolling walker (2 wheels)  Transfers Sit to/from Stand  Sit to Stand Min assist  General transfer comment assist with anterior -superior wt shift, cues for hand placement and RLE position  Ambulation/Gait  Ambulation/Gait assistance Min assist  Gait Distance (Feet) 15 Feet  Assistive device Rolling walker (2 wheels)  Gait Pattern/deviations Step-to pattern;Antalgic  General Gait Details cues for sequence, use of UEs for pain control with WBing RLE, slow but steady gait, assist to advance RLE  Gait velocity decr  PT - End of Session  Equipment Utilized During Treatment Gait belt  Activity Tolerance Patient tolerated treatment well;Patient limited by fatigue  Patient left with call bell/phone within reach;Other (comment) (bathroom per pt request)   PT - Assessment/Plan  PT Plan Current plan remains appropriate  PT Visit Diagnosis Other abnormalities of gait and mobility  (R26.89);Difficulty in walking, not elsewhere classified (R26.2)  PT Frequency (ACUTE ONLY) 7X/week  Follow Up Recommendations Follow physician's recommendations for discharge plan and follow up therapies  Assistance recommended at discharge Intermittent Supervision/Assistance  Patient can return home with the following A little help with walking and/or transfers;Assist for transportation;Help with stairs or ramp for entrance;Assistance with cooking/housework  PT equipment Rolling walker (2 wheels)  AM-PAC PT "6 Clicks" Mobility Outcome Measure (Version 2)  Help needed turning from your back to your side while in a flat bed without using bedrails? 3  Help needed moving from lying on your back to sitting on the side of a flat bed without using bedrails? 3  Help needed moving to and from a bed to a chair (including a wheelchair)? 3  Help needed standing up from a chair using your arms (e.g., wheelchair or bedside chair)? 3  Help needed to walk in hospital room? 3  Help needed climbing 3-5 steps with a railing?  2  6 Click Score 17  Consider Recommendation of Discharge To: Home with [facility]  Progressive Mobility  What is the highest level of mobility based on the progressive mobility assessment? Level 5 (Walks with assist in room/hall) - Balance while stepping forward/back and can walk in room with assist - Complete  Activity Ambulated with assistance to bathroom  PT Goal Progression  Progress towards PT goals Progressing toward goals  Acute Rehab PT Goals  PT Goal Formulation With patient  Time For Goal Achievement 11/12/21  Potential to Achieve Goals Good  PT Time Calculation  PT Start Time (ACUTE ONLY) 1457  PT Stop Time (ACUTE ONLY) 1517  PT Time Calculation (min) (ACUTE ONLY) 20 min  PT General Charges  $$ ACUTE  PT VISIT 1 Visit  PT Treatments  $Gait Training 8-22 mins

## 2021-11-06 NOTE — Progress Notes (Signed)
   11/06/21 1119  Assess: MEWS Score  Temp 99.5 F (37.5 C)  BP 108/70  MAP (mmHg) 81  Pulse Rate (!) 119  Resp 16  Level of Consciousness Alert  SpO2 96 %  O2 Device Room Air  Assess: MEWS Score  MEWS Temp 0  MEWS Systolic 0  MEWS Pulse 2  MEWS RR 0  MEWS LOC 0  MEWS Score 2  MEWS Score Color Yellow  Assess: if the MEWS score is Yellow or Red  Were vital signs taken at a resting state? Yes  Focused Assessment No change from prior assessment  Does the patient meet 2 or more of the SIRS criteria? No  MEWS guidelines implemented *See Row Information* Yes  Take Vital Signs  Increase Vital Sign Frequency  Yellow: Q 2hr X 2 then Q 4hr X 2, if remains yellow, continue Q 4hrs  Escalate  MEWS: Escalate Yellow: discuss with charge nurse/RN and consider discussing with provider and RRT  Notify: Charge Nurse/RN  Name of Charge Nurse/RN Notified Anderson Malta RN  Date Charge Nurse/RN Notified 11/06/21  Time Charge Nurse/RN Notified 1119  Notify: Provider  Provider Name/Title Dr. Ninfa Linden (aware of tachycardia)  Assess: SIRS CRITERIA  SIRS Temperature  0  SIRS Pulse 1  SIRS Respirations  0  SIRS WBC 0  SIRS Score Sum  1

## 2021-11-06 NOTE — Progress Notes (Signed)
Physical Therapy Treatment Patient Details Name: [name] MRN: [id number] DOB: 1966/01/06 Today's Date: 11/06/2021   History of Present Illness 56 yo female s/p R DA THA.PMH: pna, Graves dz, syphilis, OA, anxiety    PT Comments    Pt is progressing slowly however remains motivated to work with PT. Pt to have blood transfusion x1 unit today. Will see again this pm. Anticipate pt will be ready for d/c in am.   Recommendations for follow up therapy are one component of a multi-disciplinary discharge planning process, led by the attending physician.  Recommendations may be updated based on patient status, additional functional criteria and insurance authorization.  Follow Up Recommendations  Follow physician's recommendations for discharge plan and follow up therapies     Assistance Recommended at Discharge Intermittent Supervision/Assistance  Patient can return home with the following A little help with walking and/or transfers;Assist for transportation;Help with stairs or ramp for entrance;Assistance with cooking/housework   Equipment Recommendations  Rolling walker (2 wheels)    Recommendations for Other Services       Precautions / Restrictions Precautions Precautions: Fall Restrictions Weight Bearing Restrictions: No Other Position/Activity Restrictions: WBAT     Mobility  Bed Mobility               General bed mobility comments: OOB with NT on arrival    Transfers Overall transfer level: Needs assistance Equipment used: Rolling walker (2 wheels) Transfers: Sit to/from Stand Sit to Stand: Min assist, Mod assist           General transfer comment: assist with anterior -superior wt shift, cues for hand placement; assist from low surface/regular ht toilet, 3in1 not placed over toilet by NT    Ambulation/Gait Ambulation/Gait assistance: Min assist Gait Distance (Feet): 15 Feet (x2) Assistive device: Rolling walker (2 wheels) Gait Pattern/deviations:  Step-to pattern, Antalgic Gait velocity: decr     General Gait Details: cues for sequence, use of UEs for pain control with WBing RLE, slow but steady gait, intermittent assist to advance RLE   Stairs             Wheelchair Mobility    Modified Rankin (Stroke Patients Only)       Balance                                            Cognition Arousal/Alertness: Awake/alert Behavior During Therapy: WFL for tasks assessed/performed Overall Cognitive Status: Within Functional Limits for tasks assessed                                          Exercises Total Joint Exercises Ankle Circles/Pumps: AROM, Both, 15 reps    General Comments        Pertinent Vitals/Pain Pain Assessment Pain Assessment: 0-10 Pain Score: 5  Pain Location: right hip Pain Descriptors / Indicators: Grimacing, Sore, Guarding Pain Intervention(s): Limited activity within patient's tolerance, Monitored during session, Premedicated before session, Repositioned    Home Living                          Prior Function            PT Goals (current goals can now be found in the care plan section) Acute Rehab  PT Goals Patient Stated Goal: home PT Goal Formulation: With patient Time For Goal Achievement: 11/12/21 Potential to Achieve Goals: Good Progress towards PT goals: Progressing toward goals    Frequency    7X/week      PT Plan Current plan remains appropriate    Co-evaluation              AM-PAC PT "6 Clicks" Mobility   Outcome Measure  Help needed turning from your back to your side while in a flat bed without using bedrails?: A Little Help needed moving from lying on your back to sitting on the side of a flat bed without using bedrails?: A Little Help needed moving to and from a bed to a chair (including a wheelchair)?: A Little Help needed standing up from a chair using your arms (e.g., wheelchair or bedside chair)?: A  Little Help needed to walk in hospital room?: A Little Help needed climbing 3-5 steps with a railing? : A Lot 6 Click Score: 17    End of Session Equipment Utilized During Treatment: Gait belt Activity Tolerance: Patient tolerated treatment well;Patient limited by fatigue Patient left: in chair;with call bell/phone within reach;with chair alarm set   PT Visit Diagnosis: Other abnormalities of gait and mobility (R26.89);Difficulty in walking, not elsewhere classified (R26.2)     Time: 0762-2633 PT Time Calculation (min) (ACUTE ONLY): 21 min  Charges:  $Gait Training: 8-22 mins                     Baxter Flattery, PT  Acute Rehab Dept [facility]) [phone]  [facility] Weekend Pager [facility] only)  [phone]  11/06/2021    [facility] 11/06/2021, 1:40 PM

## 2021-11-06 NOTE — Progress Notes (Signed)
PT TX NOTE  11/06/21 1700  PT Visit Information  Last PT Received On 11/06/21  Pt continues to complain of severe pain, however is extremely sleepy and appears over-medicated. She is agreeable to exercises in bed only with encouragement. Pt verbalizes she wants to "do everything I can for myself" however then puts forth minimal effort.  Pt sister asked about pt going to rehab earlier today, pt states that she wants to go home and has plenty of assisistance.   Pt will need to make extensive gains tomorrow to d/c home.  continue PT in acute setting  Assistance Needed   History of Present Illness 56 yo female s/p R DA THA.PMH: pna, Graves dz, syphilis, OA, anxiety  Subjective Data  Patient Stated Goal home  Precautions  Precautions Fall  Restrictions  Weight Bearing Restrictions No  Other Position/Activity Restrictions WBAT  Pain Assessment  Pain Assessment 0-10  Pain Score 7  Faces Pain Scale 8  Pain Location right hip  Pain Descriptors / Indicators Grimacing;Sore;Guarding;Spasm  Pain Intervention(s) Limited activity within patient's tolerance;Monitored during session;Repositioned  Cognition  Arousal/Alertness Awake/alert;Suspect due to medications (very sleepy)  Behavior During Therapy [facility] for tasks assessed/performed  Overall Cognitive Status Within Functional Limits for tasks assessed  Total Joint Exercises  Ankle Circles/Pumps AROM;Both;15 reps  Quad Sets AROM;Both;10 reps  Short Arc Quad AAROM;AROM;Strengthening;Right;10 reps  Heel Slides AAROM;Right;10 reps;Limitations  Hip ABduction/ADduction AAROM;Right;10 reps  Heel Slides Limitations pain and guarding  PT - End of Session  Activity Tolerance Patient tolerated treatment well  Patient left in bed;with bed alarm set;with family/visitor present   PT - Assessment/Plan  PT Plan Current plan remains appropriate  PT Visit Diagnosis Other abnormalities of gait and mobility (R26.89);Difficulty in walking, not elsewhere  classified (R26.2)  PT Frequency (ACUTE ONLY) 7X/week  Follow Up Recommendations Follow physician's recommendations for discharge plan and follow up therapies  Assistance recommended at discharge Intermittent Supervision/Assistance  Patient can return home with the following A little help with walking and/or transfers;Assist for transportation;Help with stairs or ramp for entrance;Assistance with cooking/housework  PT equipment Rolling walker (2 wheels)  AM-PAC PT "6 Clicks" Mobility Outcome Measure (Version 2)  Help needed turning from your back to your side while in a flat bed without using bedrails? 3  Help needed moving from lying on your back to sitting on the side of a flat bed without using bedrails? 3  Help needed moving to and from a bed to a chair (including a wheelchair)? 3  Help needed standing up from a chair using your arms (e.g., wheelchair or bedside chair)? 3  Help needed to walk in hospital room? 3  Help needed climbing 3-5 steps with a railing?  2  6 Click Score 17  Consider Recommendation of Discharge To: Home with [facility]  Progressive Mobility  What is the highest level of mobility based on the progressive mobility assessment? Level 4 (Walks with assist in room) - Balance while marching in place and cannot step forward and back - Complete  PT Goal Progression  Progress towards PT goals Progressing toward goals  Acute Rehab PT Goals  PT Goal Formulation With patient  Time For Goal Achievement 11/12/21  Potential to Achieve Goals Good  PT Time Calculation  PT Start Time (ACUTE ONLY) 1717  PT Stop Time (ACUTE ONLY) 1730  PT Time Calculation (min) (ACUTE ONLY) 13 min  PT General Charges  $$ ACUTE PT VISIT 1 Visit  PT Treatments  $Therapeutic Exercise 8-22 mins

## 2021-11-06 NOTE — Progress Notes (Signed)
Patient ID: [name], female   DOB: Apr 28, 1966, 56 y.o.   MRN: [id number] The patient ended up needing to stay yesterday due to limited mobility from her significant pain and stiffness.  She feels a little bit better today in terms of pain control.  She did come in with chronic anemia preoperative.  However, her hemoglobin is down to 7.5 and she is slightly tachycardic.  I feel that she would benefit from 1 unit of blood today as well as continue physical therapy with discharge to home tomorrow.  We will change her to inpatient admission today with a goal of discharge to home tomorrow.

## 2021-11-06 NOTE — Plan of Care (Signed)

## 2021-11-07 DIAGNOSIS — M1611 Unilateral primary osteoarthritis, right hip: Secondary | ICD-10-CM | POA: Diagnosis not present

## 2021-11-07 LAB — TYPE AND SCREEN
ABO/RH(D): O POS
Antibody Screen: NEGATIVE
Unit division: 0

## 2021-11-07 LAB — BPAM RBC
Blood Product Expiration Date: 202308012359
ISSUE DATE / TIME: 202307091109
Unit Type and Rh: 5100

## 2021-11-07 LAB — CBC
HCT: 25.8 % — ABNORMAL LOW (ref 36.0–46.0)
Hemoglobin: 8.3 g/dL — ABNORMAL LOW (ref 12.0–15.0)
MCH: 29.1 pg (ref 26.0–34.0)
MCHC: 32.2 g/dL (ref 30.0–36.0)
MCV: 90.5 fL (ref 80.0–100.0)
Platelets: 235 10*3/uL (ref 150–400)
RBC: 2.85 MIL/uL — ABNORMAL LOW (ref 3.87–5.11)
RDW: 14.8 % (ref 11.5–15.5)
WBC: 10.1 10*3/uL (ref 4.0–10.5)
nRBC: 0 % (ref 0.0–0.2)

## 2021-11-07 NOTE — Progress Notes (Signed)
Subjective: 3 Days Post-Op Procedure(s) (LRB): RIGHT TOTAL HIP ARTHROPLASTY ANTERIOR APPROACH (Right) Patient reports pain as moderate.    Objective: Vital signs in last 24 hours: Temp:  [99.3 F (37.4 C)-100.4 F (38 C)] 99.3 F (37.4 C) (07/10 0548) Pulse Rate:  [106-119] 106 (07/10 0548) Resp:  [16-17] 17 (07/10 0548) BP: (108-139)/(70-91) 135/80 (07/10 0548) SpO2:  [91 %-98 %] 91 % (07/10 0548)  Intake/Output from previous day: 07/09 0701 - 07/10 0700 In: 1514.2 [P.O.:600; I.V.:599.2; Blood:315] Out: -  Intake/Output this shift: No intake/output data recorded.  Recent Labs    11/05/21 0338 11/06/21 0321 11/07/21 0339  HGB 9.2* 7.5* 8.3*   Recent Labs    11/06/21 0321 11/07/21 0339  WBC 7.6 10.1  RBC 2.59* 2.85*  HCT 23.7* 25.8*  PLT 245 235   Recent Labs    11/05/21 0338  NA 137  K 4.0  CL 104  CO2 25  BUN 20  CREATININE 0.64  GLUCOSE 142*  CALCIUM 8.5*   No results for input(s): "LABPT", "INR" in the last 72 hours.  Sensation intact distally Intact pulses distally Dorsiflexion/Plantar flexion intact Incision: dressing C/D/I   Assessment/Plan: 3 Days Post-Op Procedure(s) (LRB): RIGHT TOTAL HIP ARTHROPLASTY ANTERIOR APPROACH (Right) Up with therapy Discharge home with home health today.      Mcarthur Rossetti 11/07/2021, 7:32 AM

## 2021-11-07 NOTE — Discharge Summary (Signed)
Patient ID: [name] MRN: [id number] DOB/AGE: 1966-04-07 56 y.o.  Admit date: 11/04/2021 Discharge date: 11/07/2021  Admission Diagnoses:  Principal Problem:   Unilateral primary osteoarthritis, right hip Active Problems:   Status post total replacement of right hip   Discharge Diagnoses:  Same  Past Medical History:  Diagnosis Date   Anxiety DX 2011   Arthritis    Depression DX 1990   Granulomatous lung disease ([place])    Grave's disease    Graves Disease   History of radioactive iodine thyroid ablation    02/21/2019   Menometrorrhagia    Migraines    Pneumonia     Surgeries: Procedure(s): RIGHT TOTAL HIP ARTHROPLASTY ANTERIOR APPROACH on 11/04/2021   Consultants:   Discharged Condition: Improved  Hospital Course: [name] is an 56 y.o. female who was admitted 11/04/2021 for operative treatment ofUnilateral primary osteoarthritis, right hip. Patient has severe unremitting pain that affects sleep, daily activities, and work/hobbies. After pre-op clearance the patient was taken to the operating room on 11/04/2021 and underwent  Procedure(s): RIGHT TOTAL HIP ARTHROPLASTY ANTERIOR APPROACH.    Patient was given perioperative antibiotics:  Anti-infectives (From admission, onward)    Start     Dose/Rate Route Frequency Ordered Stop   11/04/21 2200  ceFAZolin (ANCEF) IVPB 1 g/50 mL premix        1 g 100 mL/hr over 30 Minutes Intravenous Every 6 hours 11/04/21 1956 11/06/21 0827   11/04/21 0945  ceFAZolin (ANCEF) IVPB 2g/100 mL premix        2 g 200 mL/hr over 30 Minutes Intravenous On call to O.R. 11/04/21 0941 11/04/21 1322        Patient was given sequential compression devices, early ambulation, and chemoprophylaxis to prevent DVT.  Patient benefited maximally from hospital stay and there were no complications.  The patient did require a transfusion of 1 unit of packed red blood cells to treat symptomatic acute blood loss anemia on top of chronic  anemia.  Recent vital signs: Patient Vitals for the past 24 hrs:  BP Temp Temp src Pulse Resp SpO2  11/07/21 0548 135/80 99.3 F (37.4 C) Oral (!) 106 17 91 %  11/06/21 2137 116/70 99.3 F (37.4 C) Oral (!) 110 17 91 %  11/06/21 1545 (!) 139/91 (!) 100.4 F (38 C) Oral (!) 107 16 95 %  11/06/21 1134 117/74 99.4 F (37.4 C) Oral (!) 113 16 98 %  11/06/21 1119 108/70 99.5 F (37.5 C) Oral (!) 119 16 96 %     Recent laboratory studies:  Recent Labs    11/05/21 0338 11/06/21 0321 11/07/21 0339  WBC 13.9* 7.6 10.1  HGB 9.2* 7.5* 8.3*  HCT 29.2* 23.7* 25.8*  PLT 362 245 235  NA 137  --   --   K 4.0  --   --   CL 104  --   --   CO2 25  --   --   BUN 20  --   --   CREATININE 0.64  --   --   GLUCOSE 142*  --   --   CALCIUM 8.5*  --   --      Discharge Medications:   Allergies as of 11/07/2021   No Known Allergies      Medication List     STOP taking these medications    methocarbamol 500 MG tablet Commonly known as: ROBAXIN   traMADol 50 MG tablet Commonly known as: Veatrice Bourbon  TAKE these medications    aspirin 81 MG chewable tablet Chew 1 tablet (81 mg total) by mouth 2 (two) times daily.   ferrous sulfate 325 (65 FE) MG tablet Take 325 mg by mouth 3 (three) times daily with meals.   ibuprofen 800 MG tablet Commonly known as: ADVIL Take 1 tablet (800 mg total) by mouth every 8 (eight) hours as needed.   levothyroxine 88 MCG tablet Commonly known as: SYNTHROID Take 1 tablet (88 mcg total) by mouth as directed. Half a tablet on 'sundays and 1 tablet the rest of the week   oxyCODONE 5 MG immediate release tablet Commonly known as: Oxy IR/ROXICODONE Take 1-2 tablets (5-10 mg total) by mouth every 6 (six) hours as needed for moderate pain (pain score 4-6).   tiZANidine 4 MG tablet Commonly known as: Zanaflex Take 1 tablet (4 mg total) by mouth every 8 (eight) hours as needed for muscle spasms.   Vitamin D (Ergocalciferol) 1.25 MG (50000 UNIT) Caps  capsule Commonly known as: DRISDOL Take 1 capsule (50,000 Units total) by mouth every 7 (seven) days.               Durable Medical Equipment  (From admission, onward)           Start     Ordered   11/04/21 1957  DME 3 n 1  Once        11/04/21 1956   11/04/21 1957  DME Walker rolling  Once       Question Answer Comment  Walker: With 5 Inch Wheels   Patient needs a walker to treat with the following condition Status post total replacement of right hip      07'$ /07/23 1956            Diagnostic Studies: DG Pelvis Portable  Result Date: 11/04/2021 CLINICAL DATA:  Status post right hip arthroplasty EXAM: PORTABLE PELVIS 1 VIEWS COMPARISON:  Right hip radiograph dated July 29, 2021 FINDINGS: Interval postsurgical changes from right total hip arthroplasty. Arthroplasty components appear in their expected alignment. No periprosthetic fracture is identified. Expected postoperative changes within the overlying soft tissues. IMPRESSION: Postsurgical changes from right total hip arthroplasty. Electronically Signed   By: Yetta Glassman M.D.   On: 11/04/2021 15:48   DG HIP UNILAT WITH PELVIS 1V RIGHT  Result Date: 11/04/2021 CLINICAL DATA:  Right hip intraoperative images. EXAM: DG HIP (WITH OR WITHOUT PELVIS) 1V RIGHT COMPARISON:  Right hip x-ray 07/29/2021 FINDINGS: Intraoperative right hip. 3 low resolution intraoperative spot views of the right hip were obtained. Right hip arthroplasty present in anatomic alignment. No fracture visible on the limited views. Total fluoroscopy time: 18 seconds Total radiation dose: 2.13 micro Gy IMPRESSION: Intraoperative right hip arthroplasty. Electronically Signed   By: Ronney Asters M.D.   On: 11/04/2021 15:16   DG C-Arm 1-60 Min-No Report  Result Date: 11/04/2021 Fluoroscopy was utilized by the requesting physician.  No radiographic interpretation.    Disposition: Discharge disposition: 01-Home or Self Care          Follow-up  Information     Mcarthur Rossetti, MD Follow up in 2 week(s).   Specialty: Orthopedic Surgery Contact information: [address] [place] [address] [phone]                  Signed: Mcarthur Rossetti 11/07/2021, 7:33 AM

## 2021-11-07 NOTE — Progress Notes (Signed)
Physical Therapy Treatment Patient Details Name: [name] MRN: [id number] DOB: Jul 04, 1965 Today's Date: 11/07/2021   History of Present Illness 56 yo female s/p R DA THA.PMH: pna, Graves dz, syphilis, OA, anxiety    PT Comments    Pt made excellent progress this session. Amb ~ 69' with supervision, reviewed THA HEP. Pt reports she has only one very small step, verbally reviewed technique. Pt is ready to d/c with family support as needed from PT standpoint.   Recommendations for follow up therapy are one component of a multi-disciplinary discharge planning process, led by the attending physician.  Recommendations may be updated based on patient status, additional functional criteria and insurance authorization.  Follow Up Recommendations  Follow physician's recommendations for discharge plan and follow up therapies     Assistance Recommended at Discharge Intermittent Supervision/Assistance  Patient can return home with the following A little help with walking and/or transfers;Assist for transportation;Help with stairs or ramp for entrance;Assistance with cooking/housework   Equipment Recommendations  Rolling walker (2 wheels)    Recommendations for Other Services       Precautions / Restrictions Precautions Precautions: Fall Restrictions Weight Bearing Restrictions: No Other Position/Activity Restrictions: WBAT     Mobility  Bed Mobility               General bed mobility comments: OOB with NT on arrival    Transfers   Equipment used: Rolling walker (2 wheels) Transfers: Sit to/from Stand Sit to Stand: Min guard, Supervision           General transfer comment: cues for hand placement and RLE position    Ambulation/Gait Ambulation/Gait assistance: Min guard, Supervision Gait Distance (Feet): 160 Feet Assistive device: Rolling walker (2 wheels) Gait Pattern/deviations: Step-to pattern, Antalgic, Decreased stance time - right Gait velocity: decr      General Gait Details: cues for sequence, RW position. pt with improved abilityto advance RLE of her own volition   Marine scientist Rankin (Stroke Patients Only)       Balance                                            Cognition Arousal/Alertness: Awake/alert Behavior During Therapy: WFL for tasks assessed/performed Overall Cognitive Status: Within Functional Limits for tasks assessed                                          Exercises Total Joint Exercises Ankle Circles/Pumps: AROM, Both, 15 reps Quad Sets: AROM, Both, 10 reps Heel Slides: AAROM, Right, 10 reps, Limitations Hip ABduction/ADduction: AAROM, Right, 10 reps    General Comments        Pertinent Vitals/Pain Pain Assessment Pain Assessment: 0-10 Pain Score: 5  Pain Location: right hip Pain Descriptors / Indicators: Grimacing, Sore, Guarding Pain Intervention(s): Limited activity within patient's tolerance, Monitored during session, Premedicated before session, Repositioned, Ice applied    Home Living                          Prior Function            PT Goals (current goals can now be found in  the care plan section) Acute Rehab PT Goals Patient Stated Goal: home PT Goal Formulation: With patient Time For Goal Achievement: 11/12/21 Potential to Achieve Goals: Good Progress towards PT goals: Progressing toward goals    Frequency    7X/week      PT Plan Current plan remains appropriate    Co-evaluation              AM-PAC PT "6 Clicks" Mobility   Outcome Measure  Help needed turning from your back to your side while in a flat bed without using bedrails?: A Little Help needed moving from lying on your back to sitting on the side of a flat bed without using bedrails?: A Little Help needed moving to and from a bed to a chair (including a wheelchair)?: A Little Help needed standing up from a  chair using your arms (e.g., wheelchair or bedside chair)?: A Little Help needed to walk in hospital room?: A Little Help needed climbing 3-5 steps with a railing? : A Little 6 Click Score: 18    End of Session Equipment Utilized During Treatment: Gait belt Activity Tolerance: Patient tolerated treatment well Patient left: with call bell/phone within reach;with chair alarm set;in chair Nurse Communication: Mobility status PT Visit Diagnosis: Other abnormalities of gait and mobility (R26.89);Difficulty in walking, not elsewhere classified (R26.2)     Time: 6759-1638 PT Time Calculation (min) (ACUTE ONLY): 27 min  Charges:  $Gait Training: 8-22 mins $Therapeutic Exercise: 8-22 mins                     Baxter Flattery, PT  Acute Rehab Dept [facility]) [phone]  [facility] Weekend Pager [facility] only)  [phone]  11/07/2021    [facility] 11/07/2021, 10:42 AM

## 2021-11-07 NOTE — Progress Notes (Signed)
Patient discharged to home w/ SO. Given all belongings, instructions, equipment. Verbalized understanding of all instructions. Escorted to pov via w/c.

## 2021-11-07 NOTE — TOC Transition Note (Signed)
Transition of Care [facility]) - CM/SW Discharge Note   Patient Details  Name: [name] MRN: [id number] Date of Birth: [birth year]  [facility] of [facility]) CM/SW Contact:  Lennart Pall, LCSW Phone Number: 11/07/2021, 12:46 PM   Clinical Narrative:    Met with pt to review dc needs.  Pt agreeable with recommendations for rolling walker and HHPT follow up and no agency preference.  Referrals placed with [place] and [place]. No further TOC needs.   Final next level of care: [place] Barriers to Discharge: No Barriers Identified   Patient Goals and CMS Choice Patient states their goals for this hospitalization and ongoing recovery are:: return home      Discharge Placement                       Discharge Plan and Services                DME Arranged: Walker rolling DME Agency: [employer] Date DME Agency Contacted: 11/07/21 Time DME Agency Contacted: [id number] Representative spoke with at DME Agency: [place]: PT [place]: [place] (([place])) Date [place]: 11/07/21 Time [place]: [place] Representative spoke with at [place]: [place] ([place]) Interventions     Readmission Risk Interventions    11/07/2021   12:45 PM  Readmission Risk Prevention Plan  Post Dischage Appt Complete  Medication Screening Complete  Transportation Screening Complete

## 2021-11-08 ENCOUNTER — Telehealth: Payer: Self-pay

## 2021-11-08 ENCOUNTER — Ambulatory Visit: Payer: Self-pay

## 2021-11-08 ENCOUNTER — Encounter (HOSPITAL_COMMUNITY): Payer: Self-pay | Admitting: Orthopaedic Surgery

## 2021-11-08 NOTE — Telephone Encounter (Signed)
This encounter was created in error - please disregard.

## 2021-11-08 NOTE — Patient Outreach (Signed)
[place] [facility]) Care Management  11/08/2021  ADELA ESTEBAN 07/04/1965 [id number]   Transition Care Management Unsuccessful Follow-up Telephone Call  Date of discharge and from where:  11/07/21 [facility]  Attempts:  1st Attempt  Reason for unsuccessful TCM follow-up call:  Left voice message  Daneen Schick, Arita Miss, CDP Social Worker, Certified Dementia Practitioner [place] [facility] [phone]

## 2021-11-08 NOTE — Patient Outreach (Addendum)
Error

## 2021-11-08 NOTE — Telephone Encounter (Signed)
Transition Care Management Follow-up Telephone Call Date of discharge and from where: 11/07/2021, [facility] How have you been since you were released from the hospital? She said she is in a lot of pain despite taking the pain medication. I encouraged to call the orthopedic surgeon if she does not feel that her pain is controlled sufficiently.  Any questions or concerns? No-just the pain as noted above  Items Reviewed: Did the pt receive and understand the discharge instructions provided? Yes  Medications obtained and verified? Yes - she said she has all of her medications.  Other? No  Any new allergies since your discharge? No  Dietary orders reviewed? No Do you have support at home? Yes   Home Care and Equipment/Supplies: Were home health services ordered? yes If so, what is the name of the agency? [place] agency set up a time to come to the patient's home? no Were any new equipment or medical supplies ordered?  Yes: RW What is the name of the medical supply agency? [facility] Were you able to get the supplies/equipment? yes Do you have any questions related to the use of the equipment or supplies? No  Functional Questionnaire: (I = Independent and D = Dependent) ADLs: ambulates with RW. WBAT. She said she has plenty of help to assist her.    Follow up appointments reviewed:  PCP Hospital f/u appt confirmed?  She did not want to schedule an appointment at this time   [facility] f/u appt confirmed? Yes  Scheduled to see orthopedic surgeon- 11/17/2021 Are transportation arrangements needed? No  If their condition worsens, is the pt aware to call PCP or go to the Emergency Dept.? Yes Was the patient provided with contact information for the PCP's office or ED? Yes Was to pt encouraged to call back with questions or concerns? Yes

## 2021-11-09 ENCOUNTER — Other Ambulatory Visit: Payer: Self-pay

## 2021-11-09 ENCOUNTER — Telehealth: Payer: Self-pay | Admitting: Orthopaedic Surgery

## 2021-11-09 DIAGNOSIS — J449 Chronic obstructive pulmonary disease, unspecified: Secondary | ICD-10-CM | POA: Diagnosis not present

## 2021-11-09 DIAGNOSIS — R69 Illness, unspecified: Secondary | ICD-10-CM | POA: Diagnosis not present

## 2021-11-09 DIAGNOSIS — Z96641 Presence of right artificial hip joint: Secondary | ICD-10-CM | POA: Diagnosis not present

## 2021-11-09 DIAGNOSIS — Z471 Aftercare following joint replacement surgery: Secondary | ICD-10-CM | POA: Diagnosis not present

## 2021-11-09 DIAGNOSIS — J8410 Pulmonary fibrosis, unspecified: Secondary | ICD-10-CM | POA: Diagnosis not present

## 2021-11-09 MED ORDER — TIZANIDINE HCL 4 MG PO TABS
4.0000 mg | ORAL_TABLET | Freq: Three times a day (TID) | ORAL | 0 refills | Status: DC | PRN
Start: 2021-11-09 — End: 2021-11-17

## 2021-11-09 NOTE — Telephone Encounter (Signed)
I called and talked to pt. States her pharmacy was out of the tizanidine and wants it sent somewhere else. I did this.

## 2021-11-09 NOTE — Telephone Encounter (Signed)
Lvm for them to cb -Tizanidine was sent to pharmacy on 11/05/21

## 2021-11-09 NOTE — Telephone Encounter (Signed)
Received call from Tonie Griffith (PT) with [facility] needing verbal orders for  HHPT 2 WK 4 and  1 Wk 4.   Zelphia Cairo said she is also following up on the 3 in 1 commode for the patient as well. Patient is still waiting on a Rx for Tizanidine.   Zelphia Cairo said the patient is still having muscle spasms. The number to contact Zelphia Cairo is [phone]

## 2021-11-09 NOTE — Telephone Encounter (Signed)
Per Caryl Pina the 3-1 commode was ordered

## 2021-11-10 NOTE — Telephone Encounter (Signed)
Called therapist again. No answer. Voicemail not set up with her info, so I did not leave a message

## 2021-11-10 NOTE — Telephone Encounter (Signed)
Calling about orders?

## 2021-11-10 NOTE — Telephone Encounter (Signed)
Left another message for therapist

## 2021-11-11 ENCOUNTER — Ambulatory Visit: Payer: Self-pay

## 2021-11-11 ENCOUNTER — Ambulatory Visit
Admission: EM | Admit: 2021-11-11 | Discharge: 2021-11-11 | Disposition: A | Discharge status: Home / Self Care | Payer: 59 | Attending: Emergency Medicine | Admitting: Emergency Medicine

## 2021-11-11 ENCOUNTER — Telehealth: Payer: Self-pay | Admitting: Orthopaedic Surgery

## 2021-11-11 DIAGNOSIS — L209 Atopic dermatitis, unspecified: Secondary | ICD-10-CM

## 2021-11-11 MED ORDER — TRIAMCINOLONE ACETONIDE 0.1 % EX CREA: 1.0000 | TOPICAL_CREAM | Freq: Two times a day (BID) | CUTANEOUS | 0 refills | Status: DC

## 2021-11-11 NOTE — ED Provider Notes (Signed)
[facility]    CSN: [id number] Arrival date & time: 11/11/21  1539    HISTORY   Chief Complaint  Patient presents with   Rash   HPI [name] is a pleasant, 56 y.o. female who presents to urgent care today complaining of Rash.  Patient states she had her right hip replaced 7 days ago and the bandage used to cover the surgical incision is still in place.  Patient states she noticed a itchy, blisterlike rash began to appear around the bandage on the anterior part of her thigh yesterday.  Patient states she has been trying not to scratch the area.  Patient states she reached out to their office via telephone encounter and was advised to come to urgent care for evaluation.  The history is provided by the patient.   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   Graves disease 04/10/2019   Prediabetes 10/18/2018   Tinea pedis 07/31/2014   Screening for HIV (human immunodeficiency virus) 07/31/2014   Cramp of limb 01/13/2011   Sarcoidosis of lung ([place]) 12/30/2010   Rhinitis, allergic 11/29/2010   Urticaria, idiopathic 10/27/2010   COPD, mild ([place]) 08/06/2010   Graves' disease 06/03/2010   MIGRAINE HEADACHE 11/12/2008   ROTATOR CUFF TEAR 11/12/2008   DEPRESSION, RECURRENT 05/12/2008   ANXIETY DISORDER, GENERALIZED 02/05/2007   TOBACCO ABUSE 02/05/2007   Past Surgical History:  Procedure Laterality Date   ABDOMINAL HYSTERECTOMY      TOTAL HIP ARTHROPLASTY Right 11/04/2021   Procedure: RIGHT TOTAL HIP ARTHROPLASTY ANTERIOR APPROACH;  Surgeon: Mcarthur Rossetti, MD;  Location: [facility];  Service: Orthopedics;  Laterality: Right;   TUBAL LIGATION     OB History   No obstetric history on file.    Home Medications    Prior to Admission medications   Medication Sig Start Date End Date Taking? Authorizing Provider  aspirin 81 MG chewable tablet Chew 1 tablet (81 mg total) by mouth 2 (two) times daily. 11/05/21   Mcarthur Rossetti, MD  ferrous sulfate 325 (65 FE) MG tablet Take 325 mg by mouth 3 (three) times daily with meals.    [provider]  ibuprofen (ADVIL) 800 MG tablet Take 1 tablet (800 mg total) by mouth every 8 (eight) hours as needed. 08/31/21   Mcarthur Rossetti, MD  levothyroxine (SYNTHROID) 88 MCG tablet Take 1 tablet (88 mcg total) by mouth as directed. Half a tablet on sundays and 1 tablet the rest of the week 01/07/21   Shamleffer, Melanie Crazier, MD  oxyCODONE (OXY IR/ROXICODONE) 5 MG immediate release tablet Take 1-2 tablets (5-10 mg total) by mouth every 6 (six) hours as needed for moderate pain (pain score 4-6). 11/05/21   Pete Pelt, PA-C  tiZANidine (ZANAFLEX) 4 MG tablet Take 1 tablet (4 mg total) by mouth every 8 (eight) hours as needed for muscle spasms. 11/09/21  Mcarthur Rossetti, MD  Vitamin D, Ergocalciferol, (DRISDOL) 1.25 MG (50000 UNIT) CAPS capsule Take 1 capsule (50,000 Units total) by mouth every 7 (seven) days. 08/13/21   Argentina Donovan, PA-C    Family History Family History  Problem Relation Age of Onset   Hypertension Mother    Heart attack Mother    Hypertension Brother    Hypertension Father    Allergies Father    Allergies Other        children   Breast cancer Maternal Aunt    Breast cancer Maternal Aunt    Social History Social History   Tobacco Use   Smoking status: Some Days    Packs/day: 0.50    Years: 32.00    Total pack years: 16.00     Types: Cigarettes   Smokeless tobacco: Never  Vaping Use   Vaping Use: Never used  Substance Use Topics   Alcohol use: Yes    Comment: occasional beer   Drug use: No   Allergies   Patient has no known allergies.  Review of Systems Review of Systems Pertinent findings revealed after performing a 14 point review of systems has been noted in the history of present illness.  Physical Exam Triage Vital Signs ED Triage Vitals  Enc Vitals Group     BP 02/25/21 0827 (!) 147/82     Pulse Rate 02/25/21 0827 72     Resp 02/25/21 0827 18     Temp 02/25/21 0827 98.3 F (36.8 C)     Temp Source 02/25/21 0827 Oral     SpO2 02/25/21 0827 98 %     Weight --      Height --      Head Circumference --      Peak Flow --      Pain Score 02/25/21 0826 5     Pain Loc --      Pain Edu? --      Excl. in [place]? --   No data found.  Updated Vital Signs BP 121/85 (BP Location: Right Arm)   Pulse 96   Temp 99 F (37.2 C) (Oral)   Resp 18   LMP 04/23/2011   SpO2 93%   Physical Exam Vitals and nursing note reviewed.  Constitutional:      General: She is not in acute distress.    Appearance: Normal appearance.  HENT:     Head: Normocephalic and atraumatic.  Eyes:     Pupils: Pupils are equal, round, and reactive to light.  Cardiovascular:     Rate and Rhythm: Normal rate and regular rhythm.  Pulmonary:     Effort: Pulmonary effort is normal.     Breath sounds: Normal breath sounds.  Musculoskeletal:        General: Normal range of motion.     Cervical back: Normal range of motion and neck supple.  Skin:    General: Skin is warm and dry.     Findings: Rash (Atopic dermatitis surrounding bandaging placed postop.) present.  Neurological:     General: No focal deficit present.     Mental Status: She is alert and oriented to person, place, and time. Mental status is at baseline.  Psychiatric:        Mood and Affect: Mood normal.        Behavior: Behavior normal.        Thought  Content: Thought content normal.        Judgment: Judgment normal.  Visual Acuity Right Eye Distance:   Left Eye Distance:   Bilateral Distance:    Right Eye Near:   Left Eye Near:    Bilateral Near:     UC Couse / Diagnostics / Procedures:     Radiology No results found.  Procedures Procedures (including critical care time) EKG  Pending results:  Labs Reviewed - No data to display  Medications Ordered in UC: Medications - No data to display  UC Diagnoses / Final Clinical Impressions(s)   I have reviewed the triage vital signs and the nursing notes.  Pertinent labs & imaging results that were available during my care of the patient were reviewed by me and considered in my medical decision making (see chart for details).    Final diagnoses:  Atopic dermatitis, unspecified type   Patient provided with a prescription for steroid cream and advised to follow-up with the surgeons office first thing Monday morning if no improvement.  Dressing change deferred as dressing is continued to remain in place until she is seen for follow-up on July 20, 6 days from now.  ED Prescriptions     Medication Sig Dispense Auth. Provider   triamcinolone cream (KENALOG) 0.1 % Apply 1 Application topically 2 (two) times daily. Apply to affected area(s) twice daily , do not apply to face. 45 g Lynden Oxford Scales, PA-C      PDMP not reviewed this encounter.  Pending results:  Labs Reviewed - No data to display  Discharge Instructions:   Discharge Instructions      Because this rash is new and it is very close to the bandage that was placed on your skin to cover your surgical incision, I believe that you are having allergic reaction to the adhesive of the bandage.  This kind of reaction is not uncommon.  In an effort to try to help you keep this bandage in place for as long as possible, recommend that you begin a topical steroid, triamcinolone 0.1%.  Please apply to the affected  area twice daily.    If you see no meaningful improvement of the rash in the next few days or if the rash continues to spread and worsen, please follow-up with your surgeon as soon as possible to have your wound evaluated.  The biggest concern is that the rash is also present around your incision which will delay healing.  Thank you for visiting urgent care today.      Disposition Upon Discharge:  Condition: stable for discharge home  Patient presented with an acute illness with associated systemic symptoms and significant discomfort requiring urgent management. In my opinion, this is a condition that a prudent lay person (someone who possesses an average knowledge of health and medicine) may potentially expect to result in complications if not addressed urgently such as respiratory distress, impairment of bodily function or dysfunction of bodily organs.   Routine symptom specific, illness specific and/or disease specific instructions were discussed with the patient and/or caregiver at length.   As such, the patient has been evaluated and assessed, work-up was performed and treatment was provided in alignment with urgent care protocols and evidence based medicine.  Patient/parent/caregiver has been advised that the patient may require follow up for further testing and treatment if the symptoms continue in spite of treatment, as clinically indicated and appropriate.  Patient/parent/caregiver has been advised to return to the [facility] or PCP if no better; to PCP or the Emergency Department if new signs and symptoms develop, or if  the current signs or symptoms continue to change or worsen for further workup, evaluation and treatment as clinically indicated and appropriate  The patient will follow up with their current PCP if and as advised. If the patient does not currently have a PCP we will assist them in obtaining one.   The patient may need specialty follow up if the symptoms continue, in spite of  conservative treatment and management, for further workup, evaluation, consultation and treatment as clinically indicated and appropriate.   Patient/parent/caregiver verbalized understanding and agreement of plan as discussed.  All questions were addressed during visit.  Please see discharge instructions below for further details of plan.  This office note has been dictated using Museum/gallery curator.  Unfortunately, this method of dictation can sometimes lead to typographical or grammatical errors.  I apologize for your inconvenience in advance if this occurs.  Please do not hesitate to reach out to me if clarification is needed.      Lynden Oxford Scales, PA-C 11/11/21 1639

## 2021-11-11 NOTE — Telephone Encounter (Signed)
Called and gave verbal ok 

## 2021-11-11 NOTE — Telephone Encounter (Signed)
  Chief Complaint: Itchy rash Symptoms: itchy rash between leg and abdomen Frequency: 3 days Pertinent Negatives: Patient denies pain fever Disposition: '[]'$ ED /'[x]'$ Urgent Care (no appt availability in office) / '[]'$ Appointment(In office/virtual)/ '[]'$  [place] [facility]/ '[]'$ Home Care/ '[]'$ Refused Recommended Disposition /'[]'$ [place] [facility]/ '[]'$  Follow-up with PCP Additional Notes: Pt recently hadhip replacement and has now developed an itchy rash that started in crease between leg and abdomen and is spreading toward hip surgery inscision. Pt will go to UC.     Summary: skin irritation on right hip   The patient has recently had a (right) hip replacement on 11/04/21   The patient has noticed skin irritation and discomfort on their right side that is approaching the incision site   The patient would like to speak with a member of clinical staff when possible      Reason for Disposition  Female  Answer Assessment - Initial Assessment Questions 1. APPEARANCE of RASH: "Describe the rash."      Bump and itching 2. LOCATION: "Where is the rash located?"      Right hip near  3. NUMBER: "How many spots are there?"      1 4. SIZE: "How big are the spots?" (Inches, centimeters or compare to size of a coin)      Spreading groove of crotch to wound  5. ONSET: "When did the rash start?"      3 days 6. ITCHING: "Does the rash itch?" If Yes, ask: "How bad is the itch?"  (Scale 0-10; or none, mild, moderate, severe)     moderate 7. PAIN: "Does the rash hurt?" If Yes, ask: "How bad is the pain?"  (Scale 0-10; or none, mild, moderate, severe)    - NONE (0): no pain    - MILD (1-3): doesn't interfere with normal activities     - MODERATE (4-7): interferes with normal activities or awakens from sleep     - SEVERE (8-10): excruciating pain, unable to do any normal activities     No pain 8. OTHER SYMPTOMS: "Do you have any other symptoms?" (e.g., fever)     no 9. PREGNANCY: "Is there any chance  you are pregnant?" "When was your last menstrual period?"     no  Answer Assessment - Initial Assessment Questions 1. APPEARANCE of RASH: "Describe the rash."      Red itchy bumps 2. LOCATION: "Where is the rash located?"      Crease between groin and leg and abdomen 3. ONSET: "When did the rash start?"      3 days 4. ITCHING: "Does the rash itch?" If Yes, ask: "How bad is the itch?"  (Scale 1-10; or mild, moderate, severe)     moderate 5. PAIN: "Does the rash hurt?" If Yes, ask: "How bad is the pain?"  (Scale 1-10; or mild, moderate, severe)     no 6. OTHER SYMPTOMS: "Do you have any other symptoms?" (e.g., fever)     no 7. PREGNANCY: "Is there any chance you are pregnant?" "When was your last menstrual period?"     no  Protocols used: Rash or Redness - Localized-A-AH, Jock Itch-A-AH

## 2021-11-11 NOTE — Discharge Instructions (Signed)
Because this rash is new and it is very close to the bandage that was placed on your skin to cover your surgical incision, I believe that you are having allergic reaction to the adhesive of the bandage.  This kind of reaction is not uncommon.  In an effort to try to help you keep this bandage in place for as long as possible, recommend that you begin a topical steroid, triamcinolone 0.1%.  Please apply to the affected area twice daily.    If you see no meaningful improvement of the rash in the next few days or if the rash continues to spread and worsen, please follow-up with your surgeon as soon as possible to have your wound evaluated.  The biggest concern is that the rash is also present around your incision which will delay healing.  Thank you for visiting urgent care today.

## 2021-11-11 NOTE — Telephone Encounter (Signed)
Genevieve from [facility] is calling verify verbal for PT orders for patient   2x for 4 weeks 1x for 3 weeks  Please advise

## 2021-11-11 NOTE — ED Triage Notes (Addendum)
The patient states she had a right hip replacement last Friday. She states she now has a itchy rash to right hip/ groin (the surgeon haas not changed the bandage yet)   Started: Tuesday

## 2021-11-17 ENCOUNTER — Ambulatory Visit (INDEPENDENT_AMBULATORY_CARE_PROVIDER_SITE_OTHER): Payer: 59 | Admitting: Orthopaedic Surgery

## 2021-11-17 ENCOUNTER — Encounter: Payer: Self-pay | Admitting: Orthopaedic Surgery

## 2021-11-17 DIAGNOSIS — Z96641 Presence of right artificial hip joint: Secondary | ICD-10-CM

## 2021-11-17 MED ORDER — TIZANIDINE HCL 4 MG PO TABS: 4.0000 mg | ORAL_TABLET | Freq: Three times a day (TID) | ORAL | 0 refills | Status: DC | PRN

## 2021-11-17 MED ORDER — OXYCODONE HCL 5 MG PO TABS: 5.0000 mg | ORAL_TABLET | Freq: Four times a day (QID) | ORAL | 0 refills | Status: DC | PRN

## 2021-11-17 NOTE — Progress Notes (Signed)
The patient is 2 weeks status post a right total hip arthroplasty through a direct anterior approach.  This is her first outpatient postoperative visit.  She is doing well overall.  She has been compliant with a baby aspirin twice a day and wearing TED hose.  She denies any calf pain or any foot and ankle swelling.  Her right hip incision looks good.  The staples are removed and Steri-Strips applied.  Her leg lengths are equal.  She will continue to increase her activities as comfort allows.  I will refill her oxycodone and Zanaflex.  We will see her back in 4 weeks to see how she is doing overall but no x-rays are needed.

## 2021-12-01 DIAGNOSIS — Z471 Aftercare following joint replacement surgery: Secondary | ICD-10-CM | POA: Diagnosis not present

## 2021-12-01 DIAGNOSIS — Z96641 Presence of right artificial hip joint: Secondary | ICD-10-CM | POA: Diagnosis not present

## 2021-12-01 DIAGNOSIS — J449 Chronic obstructive pulmonary disease, unspecified: Secondary | ICD-10-CM | POA: Diagnosis not present

## 2021-12-01 DIAGNOSIS — R69 Illness, unspecified: Secondary | ICD-10-CM | POA: Diagnosis not present

## 2021-12-01 DIAGNOSIS — J8410 Pulmonary fibrosis, unspecified: Secondary | ICD-10-CM | POA: Diagnosis not present

## 2021-12-05 DIAGNOSIS — J449 Chronic obstructive pulmonary disease, unspecified: Secondary | ICD-10-CM | POA: Diagnosis not present

## 2021-12-05 DIAGNOSIS — Z471 Aftercare following joint replacement surgery: Secondary | ICD-10-CM | POA: Diagnosis not present

## 2021-12-05 DIAGNOSIS — Z96641 Presence of right artificial hip joint: Secondary | ICD-10-CM | POA: Diagnosis not present

## 2021-12-05 DIAGNOSIS — R69 Illness, unspecified: Secondary | ICD-10-CM | POA: Diagnosis not present

## 2021-12-05 DIAGNOSIS — J8410 Pulmonary fibrosis, unspecified: Secondary | ICD-10-CM | POA: Diagnosis not present

## 2021-12-08 DIAGNOSIS — J449 Chronic obstructive pulmonary disease, unspecified: Secondary | ICD-10-CM | POA: Diagnosis not present

## 2021-12-08 DIAGNOSIS — Z471 Aftercare following joint replacement surgery: Secondary | ICD-10-CM | POA: Diagnosis not present

## 2021-12-08 DIAGNOSIS — R69 Illness, unspecified: Secondary | ICD-10-CM | POA: Diagnosis not present

## 2021-12-08 DIAGNOSIS — Z96641 Presence of right artificial hip joint: Secondary | ICD-10-CM | POA: Diagnosis not present

## 2021-12-08 DIAGNOSIS — J8410 Pulmonary fibrosis, unspecified: Secondary | ICD-10-CM | POA: Diagnosis not present

## 2021-12-12 DIAGNOSIS — J449 Chronic obstructive pulmonary disease, unspecified: Secondary | ICD-10-CM | POA: Diagnosis not present

## 2021-12-12 DIAGNOSIS — Z96641 Presence of right artificial hip joint: Secondary | ICD-10-CM | POA: Diagnosis not present

## 2021-12-12 DIAGNOSIS — J8410 Pulmonary fibrosis, unspecified: Secondary | ICD-10-CM | POA: Diagnosis not present

## 2021-12-12 DIAGNOSIS — R69 Illness, unspecified: Secondary | ICD-10-CM | POA: Diagnosis not present

## 2021-12-12 DIAGNOSIS — Z471 Aftercare following joint replacement surgery: Secondary | ICD-10-CM | POA: Diagnosis not present

## 2021-12-19 ENCOUNTER — Ambulatory Visit: Payer: 59 | Admitting: Orthopaedic Surgery

## 2021-12-19 ENCOUNTER — Encounter: Payer: Self-pay | Admitting: Orthopaedic Surgery

## 2021-12-19 DIAGNOSIS — Z471 Aftercare following joint replacement surgery: Secondary | ICD-10-CM | POA: Diagnosis not present

## 2021-12-19 DIAGNOSIS — Z96641 Presence of right artificial hip joint: Secondary | ICD-10-CM

## 2021-12-19 DIAGNOSIS — J449 Chronic obstructive pulmonary disease, unspecified: Secondary | ICD-10-CM | POA: Diagnosis not present

## 2021-12-19 DIAGNOSIS — R69 Illness, unspecified: Secondary | ICD-10-CM | POA: Diagnosis not present

## 2021-12-19 DIAGNOSIS — J8410 Pulmonary fibrosis, unspecified: Secondary | ICD-10-CM | POA: Diagnosis not present

## 2021-12-19 MED ORDER — MELOXICAM 15 MG PO TABS: 15.0000 mg | ORAL_TABLET | Freq: Every day | ORAL | 3 refills | Status: DC | PRN

## 2021-12-19 MED ORDER — TIZANIDINE HCL 4 MG PO TABS: 4.0000 mg | ORAL_TABLET | Freq: Three times a day (TID) | ORAL | 1 refills | Status: DC | PRN

## 2021-12-19 MED ORDER — PREDNISONE 20 MG PO TABS
20.0000 mg | ORAL_TABLET | Freq: Every day | ORAL | 0 refills | Status: DC
Start: 2021-12-19 — End: 2022-01-12

## 2021-12-19 NOTE — Progress Notes (Signed)
The patient is a 56 year old who is now about 6 weeks status post a right total hip arthroplasty.  She has had a really hard time with mobility with getting around on her hip as well as having knee pain.  She still does walk with a limp to her gait.  Her right operative hip moves smoothly and fluidly but is definitely some experiencing pain with the right hip and her right knee.  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  I would like to send her to outpatient physical therapy for strengthening of her right hip and her right knee.  I will continue muscle relaxants but I would like to add meloxicam and 5 days of prednisone 20 mg to see if this will help with her inflammation.  We will see her back in 4 weeks.  I would like a standing low AP pelvis and a lateral of her right hip at that visit as well as 2 views of the right knee if she is having knee issues.

## 2021-12-20 ENCOUNTER — Other Ambulatory Visit: Payer: Self-pay

## 2021-12-20 DIAGNOSIS — Z96641 Presence of right artificial hip joint: Secondary | ICD-10-CM

## 2021-12-23 ENCOUNTER — Other Ambulatory Visit: Payer: Self-pay | Admitting: Internal Medicine

## 2021-12-26 NOTE — Therapy (Unsigned)
OUTPATIENT PHYSICAL THERAPY LOWER EXTREMITY EVALUATION   Patient Name: [name] MRN: [id number] DOB:1965-08-06, 56 y.o., female Today's Date: 12/27/2021   PT End of Session - 12/27/21 1234     Visit Number 1    Number of Visits 16    Authorization Type [employer]    PT Start Time 1146    PT Stop Time 2119    PT Time Calculation (min) 34 min    Activity Tolerance Patient tolerated treatment well    Behavior During Therapy [facility] for tasks assessed/performed             Past Medical History:  Diagnosis Date   Anxiety DX 2011   Arthritis    Depression DX 1990   Granulomatous lung disease ([place])    Grave's disease    Graves Disease   History of radioactive iodine thyroid ablation    02/21/2019   Menometrorrhagia    Migraines    Pneumonia    Past Surgical History:  Procedure Laterality Date   ABDOMINAL HYSTERECTOMY     TOTAL HIP ARTHROPLASTY Right 11/04/2021   Procedure: RIGHT TOTAL HIP ARTHROPLASTY ANTERIOR APPROACH;  Surgeon: Mcarthur Rossetti, MD;  Location: [facility];  Service: Orthopedics;  Laterality: Right;   TUBAL LIGATION     Patient Active Problem List   Diagnosis Date Noted   Status post total replacement of right hip 11/04/2021   Syphilis 09/08/2021   Hand, foot and mouth disease 09/08/2021   Unilateral primary osteoarthritis, right hip 08/31/2021   Dysphagia 07/01/2020   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   Graves disease 04/10/2019   Prediabetes 10/18/2018   Tinea pedis 07/31/2014   Screening for HIV (human immunodeficiency virus) 07/31/2014   Cramp of limb 01/13/2011   Sarcoidosis of lung ([place]) 12/30/2010   Rhinitis, allergic 11/29/2010   Urticaria, idiopathic 10/27/2010   COPD, mild ([place]) 08/06/2010   Graves' disease 06/03/2010   MIGRAINE HEADACHE 11/12/2008   ROTATOR CUFF TEAR 11/12/2008   DEPRESSION, RECURRENT 05/12/2008   ANXIETY DISORDER, GENERALIZED 02/05/2007   TOBACCO ABUSE 02/05/2007    PCP: Ladell Pier, MD  REFERRING PROVIDER: Mcarthur Rossetti, MD  REFERRING DIAG: Status post total replacement of right hip [phone]  THERAPY DIAG:  Pain in right hip - Plan: PT plan of care cert/re-cert  Muscle weakness (generalized) - Plan: PT plan of care cert/re-cert  Localized edema - Plan: PT plan of care cert/re-cert  Stiffness of right hip, not elsewhere classified - Plan: PT plan of care cert/re-cert  Rationale for Evaluation and Treatment Rehabilitation  ONSET DATE: 11/04/2021  SUBJECTIVE:   SUBJECTIVE STATEMENT: Pt states that she had chronic R hip pain that was not responding to conservative treatments resulting in a THA. Pt states that since sx she continues to have pain in her R groin and lateral thigh.   PERTINENT HISTORY: Anxiety, depression, arthritis, migraines, graves disease.   PAIN:  Are you having pain? Yes: NPRS scale: 4-8, currently 5.5/10 Pain location: R groin and lateral thigh.  Pain description: Dull ache.  Aggravating factors: siting/ standing for prolonged periods.  Relieving factors: Moving about to minimize stiffness.   PRECAUTIONS: None  WEIGHT BEARING RESTRICTIONS No  FALLS:  Has patient fallen in last 6 months? No  LIVING ENVIRONMENT: Lives with: lives with their family Lives in: House/apartment Stairs: Yes: Internal: 14 steps; on right going up Has following equipment at home: None  OCCUPATION: Chartered certified accountant.   PLOF: Independent  PATIENT GOALS Pt would like  to minimize pain and return to PLOF.    OBJECTIVE:   DIAGNOSTIC FINDINGS: OA of R hip  PATIENT SURVEYS:  FOTO 44%, predicted 66% in 13 visits.   COGNITION:  Overall cognitive status: Within functional limits for tasks assessed     SENSATION: WFL  POSTURE: No Significant postural limitations  PALPATION: Pt is tender to palpation surrounding surgical site in hip flexors and abductors.   LOWER EXTREMITY ROM:  Active ROM Right eval Left eval  Hip flexion 120 120   Hip extension    Hip abduction PROM 40 PROM 50  Hip adduction    Hip internal rotation  [facility]  Hip external rotation  [facility]  Knee flexion [facility] WFL  Knee extension WFL WFL   (Blank rows = not tested)  LOWER EXTREMITY MMT:  MMT Right eval Left eval  Hip flexion 4- 4-  Hip extension    Hip abduction Withheld due to pain   Hip adduction    Knee flexion 5 5  Knee extension 4+ 5   (Blank rows = not tested)  FUNCTIONAL TESTS:  5 times sit to stand: 15.26 sec Timed up and go (TUG): 15.16 sec   GAIT: Distance walked: 100 ft Assistive device utilized: None Level of assistance: Complete Independence Comments: Pt walks with slow antalgic gait pattern with decreased step length and stance on R LE.    TODAY'S TREATMENT: Creating, reviewing, and completing below HEP   PATIENT EDUCATION:  Education details: Educated pt on anatomy and physiology of current symptoms, FOTO, diagnosis, prognosis, HEP,  and POC. Person educated: Patient Education method: Customer service manager Education comprehension: verbalized understanding and returned demonstration   HOME EXERCISE PROGRAM: Access Code: [email or web address] URL: [email or web address] Date: 12/27/2021 Prepared by: Rudi Heap  Exercises - Clamshell  - 2 x daily - 7 x weekly - 2 sets - 10 reps - Modified Thomas Stretch  - 2 x daily - 7 x weekly - 2 sets - 2 reps - 30 hold  ASSESSMENT:  CLINICAL IMPRESSION: Patient referred to PT for s/p R THA. Pt demonstrates functional ROM, but decreased strength. Pt ambulates with antalgic gait pattern.Patient will benefit from skilled PT to address below impairments, limitations and improve overall function.  OBJECTIVE IMPAIRMENTS: decreased activity tolerance, difficulty walking, decreased balance, decreased endurance, decreased mobility, decreased ROM, decreased strength, impaired flexibility, impaired LE use, and pain.  ACTIVITY LIMITATIONS: bending, lifting, carry, locomotion,  cleaning, community activity, driving, and or occupation  PERSONAL FACTORS: Anxiety, depression, arthritis, migraines, graves disease.  are also affecting patient's functional outcome.  REHAB POTENTIAL: Good  CLINICAL DECISION MAKING: Stable/uncomplicated  EVALUATION COMPLEXITY: Low   GOALS: Short term PT Goals Target date: 01/24/2022 Pt will be I and compliant with HEP. Baseline:  Goal status: New Pt will decrease pain by 25% overall Baseline: Goal status: New  Long term PT goals Target date: 02/21/2022 Pt will improve  hip/knee strength to at least 5-/5 MMT to improve functional strength Baseline: Goal status: New Pt will improve FOTO to at least 66% functional to show improved function Baseline: Goal status: New Pt will reduce pain by overall 50% overall with usual activity Baseline: Goal status: New Pt will reduce pain to overall less than 2-3/10 with usual activity and work activity. Baseline: Goal status: New Pt will be able to ambulate community distances at least 1000 ft WNL gait pattern without complaints Baseline: Goal status: New       6. Pt will improve TUG by 3.4 seconds or Minimal clinically  important difference.        Baseline:        Goal status: New  PLAN: PT FREQUENCY: 1-2 times per week   PT DURATION: 6-8 weeks  PLANNED INTERVENTIONS (unless contraindicated): aquatic PT, Canalith repositioning, cryotherapy, Electrical stimulation, Iontophoresis with 4 mg/ml dexamethasome, Moist heat, traction, Ultrasound, gait training, Therapeutic exercise, balance training, neuromuscular re-education, patient/family education, prosthetic training, manual techniques, passive ROM, dry needling, taping, vasopnuematic device, vestibular, spinal manipulations, joint manipulations  PLAN FOR NEXT SESSION: Assess HEP/update PRN, continue to progress functional mobility, strengthen proximal hip muscles. Decrease patients pain and help minimize functional deficits. Improve  gait and balance.     Lynden Ang, PT 12/27/2021, 12:35 PM

## 2021-12-27 ENCOUNTER — Ambulatory Visit (INDEPENDENT_AMBULATORY_CARE_PROVIDER_SITE_OTHER): Payer: 59 | Admitting: Physical Therapy

## 2021-12-27 ENCOUNTER — Encounter: Payer: Self-pay | Admitting: Physical Therapy

## 2021-12-27 ENCOUNTER — Other Ambulatory Visit: Payer: Self-pay

## 2021-12-27 DIAGNOSIS — R600 Localized edema: Secondary | ICD-10-CM | POA: Diagnosis not present

## 2021-12-27 DIAGNOSIS — M25651 Stiffness of right hip, not elsewhere classified: Secondary | ICD-10-CM | POA: Diagnosis not present

## 2021-12-27 DIAGNOSIS — M6281 Muscle weakness (generalized): Secondary | ICD-10-CM | POA: Diagnosis not present

## 2021-12-27 DIAGNOSIS — M25551 Pain in right hip: Secondary | ICD-10-CM

## 2021-12-27 NOTE — Therapy (Unsigned)
OUTPATIENT PHYSICAL THERAPY LOWER EXTREMITY EVALUATION   Patient Name: [name] MRN: [id number] DOB:12/11/1965, 56 y.o., female Today's Date: 12/29/2021   PT End of Session - 12/29/21 1144     Visit Number 2    Number of Visits 16    Date for PT Re-Evaluation 03/01/22    Authorization Type [employer] [facility] [employer]    PT Start Time 1058    PT Stop Time 3299    PT Time Calculation (min) 46 min    Activity Tolerance Patient tolerated treatment well    Behavior During Therapy [facility] for tasks assessed/performed              Past Medical History:  Diagnosis Date   Anxiety DX 2011   Arthritis    Depression DX 1990   Granulomatous lung disease ([place])    Grave's disease    Graves Disease   History of radioactive iodine thyroid ablation    02/21/2019   Menometrorrhagia    Migraines    Pneumonia    Past Surgical History:  Procedure Laterality Date   ABDOMINAL HYSTERECTOMY     TOTAL HIP ARTHROPLASTY Right 11/04/2021   Procedure: RIGHT TOTAL HIP ARTHROPLASTY ANTERIOR APPROACH;  Surgeon: Mcarthur Rossetti, MD;  Location: [facility];  Service: Orthopedics;  Laterality: Right;   TUBAL LIGATION     Patient Active Problem List   Diagnosis Date Noted   Status post total replacement of right hip 11/04/2021   Syphilis 09/08/2021   Hand, foot and mouth disease 09/08/2021   Unilateral primary osteoarthritis, right hip 08/31/2021   Dysphagia 07/01/2020   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   Graves disease 04/10/2019   Prediabetes 10/18/2018   Tinea pedis 07/31/2014   Screening for HIV (human immunodeficiency virus) 07/31/2014   Cramp of limb 01/13/2011   Sarcoidosis of lung ([place]) 12/30/2010   Rhinitis, allergic 11/29/2010   Urticaria, idiopathic 10/27/2010   COPD, mild ([place]) 08/06/2010   Graves' disease 06/03/2010   MIGRAINE HEADACHE 11/12/2008   ROTATOR CUFF TEAR 11/12/2008   DEPRESSION, RECURRENT 05/12/2008   ANXIETY DISORDER, GENERALIZED 02/05/2007   TOBACCO ABUSE  02/05/2007    PCP: Ladell Pier, MD  REFERRING PROVIDER: Mcarthur Rossetti, MD  REFERRING DIAG: Status post total replacement of right hip [phone]  THERAPY DIAG:  Muscle weakness (generalized)  Localized edema  Stiffness of right hip, not elsewhere classified  Pain in right hip  Rationale for Evaluation and Treatment Rehabilitation  ONSET DATE: 11/04/2021  SUBJECTIVE:   SUBJECTIVE STATEMENT: Pt reports being stiff today, but no pain.   PERTINENT HISTORY: Anxiety, depression, arthritis, migraines, graves disease.   PAIN:  Are you having pain? Yes: NPRS scale: 4-8, currently 5.5/10 Pain location: R groin and lateral thigh.  Pain description: Dull ache.  Aggravating factors: siting/ standing for prolonged periods.  Relieving factors: Moving about to minimize stiffness.   PRECAUTIONS: None  WEIGHT BEARING RESTRICTIONS No  FALLS:  Has patient fallen in last 6 months? No  LIVING ENVIRONMENT: Lives with: lives with their family Lives in: House/apartment Stairs: Yes: Internal: 14 steps; on right going up Has following equipment at home: None  OCCUPATION: Chartered certified accountant.   PLOF: Independent  PATIENT GOALS Pt would like to minimize pain and return to PLOF.    OBJECTIVE:   DIAGNOSTIC FINDINGS: OA of R hip  PATIENT SURVEYS:  FOTO 44%, predicted 66% in 13 visits.   COGNITION:  Overall cognitive status: Within functional limits for tasks assessed     SENSATION:  WFL  POSTURE: No Significant postural limitations  PALPATION: Pt is tender to palpation surrounding surgical site in hip flexors and abductors.   LOWER EXTREMITY ROM:  Active ROM Right eval Left eval  Hip flexion 120 120  Hip extension    Hip abduction PROM 40 PROM 50  Hip adduction    Hip internal rotation  [facility]  Hip external rotation  [facility]  Knee flexion [facility] WFL  Knee extension WFL WFL   (Blank rows = not tested)  LOWER EXTREMITY MMT:  MMT Right eval Left eval  Hip  flexion 4- 4-  Hip extension    Hip abduction Withheld due to pain   Hip adduction    Knee flexion 5 5  Knee extension 4+ 5   (Blank rows = not tested)  FUNCTIONAL TESTS:  5 times sit to stand: 15.26 sec Timed up and go (TUG): 15.16 sec   GAIT: Distance walked: 100 ft Assistive device utilized: None Level of assistance: Complete Independence Comments: Pt walks with slow antalgic gait pattern with decreased step length and stance on R LE.    TODAY'S TREATMENT: [facility] Adult PT Treatment:                                                DATE: 12/29/2021 Therapeutic Exercise: Nustep lvl 5, 5 min Heel slides with strap x20  LTR x20 HS stretch with strap x2 30" hold IT band stretch with strap x2 30" hold to pt tolerance. VC for a stretch and to go slow.  Sit to stand  Manual Therapy: STM to hip flexor.  Long axis distraction due to reports of pinching in hip.  Neuromuscular re-ed: Supine clams with GTB x20 Supine marching with GTB 1 min x2  SLR with quad activation 2x10 BIL Modalities: 8 min heat to R hip    PATIENT EDUCATION:  Education details: Educated pt on anatomy and physiology of current symptoms, FOTO, diagnosis, prognosis, HEP,  and POC. Person educated: Patient Education method: Customer service manager Education comprehension: verbalized understanding and returned demonstration   HOME EXERCISE PROGRAM: Access Code: [email or web address] URL: [email or web address] Date: 12/27/2021 Prepared by: Rudi Heap  Exercises - Clamshell  - 2 x daily - 7 x weekly - 2 sets - 10 reps - Modified Thomas Stretch  - 2 x daily - 7 x weekly - 2 sets - 2 reps - 30 hold  ASSESSMENT:  CLINICAL IMPRESSION: Patient presents to first follow up appt with no pain but noted stiffness in her R hip. She reports compliance with her HEP. Pt tolerated all prescribed exercises well today with anterior hip pinch noted with LTR today. Pt reports reduction in symptoms after manual  therapy. Session with focus on hip mobility and proximal hip strength. Moist heat applied to R hip at end of session. Pt departs session with no familiar stiffness. Pt will continue to benefit from skilled PT to address continued deficits.   OBJECTIVE IMPAIRMENTS: decreased activity tolerance, difficulty walking, decreased balance, decreased endurance, decreased mobility, decreased ROM, decreased strength, impaired flexibility, impaired LE use, and pain.  ACTIVITY LIMITATIONS: bending, lifting, carry, locomotion, cleaning, community activity, driving, and or occupation  PERSONAL FACTORS: Anxiety, depression, arthritis, migraines, graves disease.  are also affecting patient's functional outcome.  REHAB POTENTIAL: Good  CLINICAL DECISION MAKING: Stable/uncomplicated  EVALUATION COMPLEXITY: Low   GOALS: Short term PT Goals  Target date: 01/26/2022 Pt will be I and compliant with HEP. Baseline:  Goal status: New Pt will decrease pain by 25% overall Baseline: Goal status: New  Long term PT goals Target date: 02/23/2022 Pt will improve  hip/knee strength to at least 5-/5 MMT to improve functional strength Baseline: Goal status: New Pt will improve FOTO to at least 66% functional to show improved function Baseline: Goal status: New Pt will reduce pain by overall 50% overall with usual activity Baseline: Goal status: New Pt will reduce pain to overall less than 2-3/10 with usual activity and work activity. Baseline: Goal status: New Pt will be able to ambulate community distances at least 1000 ft WNL gait pattern without complaints Baseline: Goal status: New       6. Pt will improve TUG by 3.4 seconds or Minimal clinically important difference.        Baseline:        Goal status: New  PLAN: PT FREQUENCY: 1-2 times per week   PT DURATION: 6-8 weeks  PLANNED INTERVENTIONS (unless contraindicated): aquatic PT, Canalith repositioning, cryotherapy, Electrical stimulation,  Iontophoresis with 4 mg/ml dexamethasome, Moist heat, traction, Ultrasound, gait training, Therapeutic exercise, balance training, neuromuscular re-education, patient/family education, prosthetic training, manual techniques, passive ROM, dry needling, taping, vasopnuematic device, vestibular, spinal manipulations, joint manipulations  PLAN FOR NEXT SESSION: Assess HEP/update PRN, continue to progress functional mobility, strengthen proximal hip muscles. Decrease patients pain and help minimize functional deficits. Improve gait and balance.     Lynden Ang, PT 12/29/2021, 11:52 AM

## 2021-12-29 ENCOUNTER — Encounter: Payer: Self-pay | Admitting: Physical Therapy

## 2021-12-29 ENCOUNTER — Ambulatory Visit (INDEPENDENT_AMBULATORY_CARE_PROVIDER_SITE_OTHER): Payer: 59 | Admitting: Physical Therapy

## 2021-12-29 DIAGNOSIS — M6281 Muscle weakness (generalized): Secondary | ICD-10-CM | POA: Diagnosis not present

## 2021-12-29 DIAGNOSIS — M25551 Pain in right hip: Secondary | ICD-10-CM | POA: Diagnosis not present

## 2021-12-29 DIAGNOSIS — M25651 Stiffness of right hip, not elsewhere classified: Secondary | ICD-10-CM | POA: Diagnosis not present

## 2021-12-29 DIAGNOSIS — R600 Localized edema: Secondary | ICD-10-CM

## 2021-12-30 DIAGNOSIS — J449 Chronic obstructive pulmonary disease, unspecified: Secondary | ICD-10-CM | POA: Diagnosis not present

## 2021-12-30 DIAGNOSIS — Z96641 Presence of right artificial hip joint: Secondary | ICD-10-CM | POA: Diagnosis not present

## 2021-12-30 DIAGNOSIS — J8410 Pulmonary fibrosis, unspecified: Secondary | ICD-10-CM | POA: Diagnosis not present

## 2021-12-30 DIAGNOSIS — Z471 Aftercare following joint replacement surgery: Secondary | ICD-10-CM | POA: Diagnosis not present

## 2021-12-30 DIAGNOSIS — R69 Illness, unspecified: Secondary | ICD-10-CM | POA: Diagnosis not present

## 2022-01-03 ENCOUNTER — Encounter: Payer: Self-pay | Admitting: Physical Therapy

## 2022-01-03 ENCOUNTER — Ambulatory Visit: Payer: 59 | Admitting: Physical Therapy

## 2022-01-03 DIAGNOSIS — M5441 Lumbago with sciatica, right side: Secondary | ICD-10-CM | POA: Diagnosis not present

## 2022-01-03 DIAGNOSIS — M5442 Lumbago with sciatica, left side: Secondary | ICD-10-CM | POA: Diagnosis not present

## 2022-01-03 DIAGNOSIS — R600 Localized edema: Secondary | ICD-10-CM

## 2022-01-03 DIAGNOSIS — M25551 Pain in right hip: Secondary | ICD-10-CM | POA: Diagnosis not present

## 2022-01-03 DIAGNOSIS — M25651 Stiffness of right hip, not elsewhere classified: Secondary | ICD-10-CM

## 2022-01-03 DIAGNOSIS — M25552 Pain in left hip: Secondary | ICD-10-CM | POA: Diagnosis not present

## 2022-01-03 DIAGNOSIS — G8929 Other chronic pain: Secondary | ICD-10-CM

## 2022-01-03 DIAGNOSIS — M6281 Muscle weakness (generalized): Secondary | ICD-10-CM

## 2022-01-03 NOTE — Therapy (Addendum)
OUTPATIENT PHYSICAL THERAPY LOWER EXTREMITY   Patient Name: [name] MRN: [id number] DOB:08/03/1965, 56 y.o., female Today's Date: 01/03/2022   PT End of Session - 01/03/22 1150     Visit Number 3    Number of Visits 16    Date for PT Re-Evaluation 03/01/22    Authorization Type [employer] [facility]    PT Start Time 1103    PT Stop Time 1148    PT Time Calculation (min) 45 min    Activity Tolerance Patient tolerated treatment well    Behavior During Therapy [facility] for tasks assessed/performed               Past Medical History:  Diagnosis Date   Anxiety DX 2011   Arthritis    Depression DX 1990   Granulomatous lung disease ([place])    Grave's disease    Graves Disease   History of radioactive iodine thyroid ablation    02/21/2019   Menometrorrhagia    Migraines    Pneumonia    Past Surgical History:  Procedure Laterality Date   ABDOMINAL HYSTERECTOMY     TOTAL HIP ARTHROPLASTY Right 11/04/2021   Procedure: RIGHT TOTAL HIP ARTHROPLASTY ANTERIOR APPROACH;  Surgeon: Mcarthur Rossetti, MD;  Location: [facility];  Service: Orthopedics;  Laterality: Right;   TUBAL LIGATION     Patient Active Problem List   Diagnosis Date Noted   Status post total replacement of right hip 11/04/2021   Syphilis 09/08/2021   Hand, foot and mouth disease 09/08/2021   Unilateral primary osteoarthritis, right hip 08/31/2021   Dysphagia 07/01/2020   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   Graves disease 04/10/2019   Prediabetes 10/18/2018   Tinea pedis 07/31/2014   Screening for HIV (human immunodeficiency virus) 07/31/2014   Cramp of limb 01/13/2011   Sarcoidosis of lung ([place]) 12/30/2010   Rhinitis, allergic 11/29/2010   Urticaria, idiopathic 10/27/2010   COPD, mild ([place]) 08/06/2010   Graves' disease 06/03/2010   MIGRAINE HEADACHE 11/12/2008   ROTATOR CUFF TEAR 11/12/2008   DEPRESSION, RECURRENT 05/12/2008   ANXIETY DISORDER, GENERALIZED 02/05/2007   TOBACCO ABUSE  02/05/2007    PCP: Ladell Pier, MD  REFERRING PROVIDER: Mcarthur Rossetti, MD  REFERRING DIAG: Status post total replacement of right hip [phone]  THERAPY DIAG:  Muscle weakness (generalized)  Localized edema  Stiffness of right hip, not elsewhere classified  Pain in right hip  Pain in left hip  Chronic bilateral low back pain with bilateral sciatica  Rationale for Evaluation and Treatment Rehabilitation  ONSET DATE: 11/04/2021  SUBJECTIVE:   SUBJECTIVE STATEMENT: Pt reporting pain of 3/10 in Rt anterior hip  PERTINENT HISTORY: Anxiety, depression, arthritis, migraines, graves disease.   PAIN:  Are you having pain? Yes: NPRS scale: 3/10 Pain location: R groin and lateral thigh.  Pain description: Dull ache.  Aggravating factors: siting/ standing for prolonged periods.  Relieving factors: Moving about to minimize stiffness.   PRECAUTIONS: None  WEIGHT BEARING RESTRICTIONS No  FALLS:  Has patient fallen in last 6 months? No  LIVING ENVIRONMENT: Lives with: lives with their family Lives in: House/apartment Stairs: Yes: Internal: 14 steps; on right going up Has following equipment at home: None  OCCUPATION: Chartered certified accountant.   PLOF: Independent  PATIENT GOALS Pt would like to minimize pain and return to PLOF.    OBJECTIVE:   DIAGNOSTIC FINDINGS: OA of R hip  PATIENT SURVEYS:  FOTO 44%, predicted 66% in 13 visits.   COGNITION:  Overall cognitive  status: Within functional limits for tasks assessed     SENSATION: WFL  POSTURE: No Significant postural limitations  PALPATION: Pt is tender to palpation surrounding surgical site in hip flexors and abductors.   LOWER EXTREMITY ROM:  Active ROM Right eval Left eval Rt 01/03/22  Hip flexion 120 120 120  Hip extension     Hip abduction PROM 40 PROM 50 44  Hip adduction     Hip internal rotation  WFL   Hip external rotation  [facility]   Knee flexion [facility] WFL   Knee extension WFL WFL     (Blank rows = not tested)  LOWER EXTREMITY MMT:  MMT Right eval Left eval  Hip flexion 4- 4-  Hip extension    Hip abduction Withheld due to pain   Hip adduction    Knee flexion 5 5  Knee extension 4+ 5   (Blank rows = not tested)  FUNCTIONAL TESTS:  5 times sit to stand: 15.26 sec Timed up and go (TUG): 15.16 sec   GAIT: Distance walked: 100 ft Assistive device utilized: None Level of assistance: Complete Independence Comments: Pt walks with slow antalgic gait pattern with decreased step length and stance on R LE.    TODAY'S TREATMENT: 01/03/2022: Therapeutic Exercise: Nustep Level 6, x  6 min Calf stretch on slant board x 2 holding 30 sec Leg Press: 56# bil LE's 2  x10 Leg Press: 25# Rt LE only x 10 and 31# x 10 LTR x 3 holding 20 seconds Seated HS stretch x 2 holding 30 sec Seated IT band stretch x 2 holding 30 sec Seated SLR 2 x 10 Rt LE only IT band stretch with strap x 2 30 sec hold  Sit to stand x 10 Manual Therapy: IASTM to hip flexor and lateral hip musculature Long axis distraction x 3 holding 30 sec Modalities: 5 min heat to R hip     [facility] Adult PT Treatment:                                                DATE: 12/29/2021 Therapeutic Exercise: Nustep lvl 5, 5 min Heel slides with strap x20  LTR x20 HS stretch with strap x2 30" hold IT band stretch with strap x2 30" hold to pt tolerance. VC for a stretch and to go slow.  Sit to stand  Manual Therapy: STM to hip flexor.  Long axis distraction due to reports of pinching in hip.  Neuromuscular re-ed: Supine clams with GTB x20 Supine marching with GTB 1 min x2  SLR with quad activation 2x10 BIL Modalities: 8 min heat to R hip    PATIENT EDUCATION:  Education details: Educated pt on anatomy and physiology of current symptoms, FOTO, diagnosis, prognosis, HEP,  and POC. Person educated: Patient Education method: Customer service manager Education comprehension: verbalized understanding and  returned demonstration   HOME EXERCISE PROGRAM: Access Code: [email or web address] URL: [email or web address] Date: 12/27/2021 Prepared by: Rudi Heap  Exercises - Clamshell  - 2 x daily - 7 x weekly - 2 sets - 10 reps - Modified Thomas Stretch  - 2 x daily - 7 x weekly - 2 sets - 2 reps - 30 hold  ASSESSMENT:  CLINICAL IMPRESSION:  Pt tolerating exercises well. Pt did report increased pain prior to manual therapy following strengthening exercises.  Pt with good response to  Manual therapy and long axis distraction on the Rt LE. Pt reporting pain </=3/10 at end of session. Continue skilled PT to maximize pt's function.     OBJECTIVE IMPAIRMENTS: decreased activity tolerance, difficulty walking, decreased balance, decreased endurance, decreased mobility, decreased ROM, decreased strength, impaired flexibility, impaired LE use, and pain.  ACTIVITY LIMITATIONS: bending, lifting, carry, locomotion, cleaning, community activity, driving, and or occupation  PERSONAL FACTORS: Anxiety, depression, arthritis, migraines, graves disease.  are also affecting patient's functional outcome.  REHAB POTENTIAL: Good  CLINICAL DECISION MAKING: Stable/uncomplicated  EVALUATION COMPLEXITY: Low   GOALS: Short term PT Goals Target date: 01/31/2022 Pt will be I and compliant with HEP. Baseline:  Goal status: On-going: 01/03/2022  Pt will decrease pain by 25% overall Baseline: Goal status: on-going 01/03/2022  Long term PT goals Target date: 02/28/2022 Pt will improve  hip/knee strength to at least 5-/5 MMT to improve functional strength Baseline: Goal status: New  Pt will improve FOTO to at least 66% functional to show improved function Baseline: Goal status: New  Pt will reduce pain by overall 50% overall with usual activity Baseline: Goal status: New  Pt will reduce pain to overall less than 2-3/10 with usual activity and work activity. Baseline: Goal status: New  Pt will be able to  ambulate community distances at least 1000 ft WNL gait pattern without complaints Baseline: Goal status: New        6. Pt will improve TUG by 3.4 seconds or Minimal clinically important difference.        Baseline:        Goal status: New  PLAN: PT FREQUENCY: 1-2 times per week   PT DURATION: 6-8 weeks  PLANNED INTERVENTIONS (unless contraindicated): aquatic PT, Canalith repositioning, cryotherapy, Electrical stimulation, Iontophoresis with 4 mg/ml dexamethasome, Moist heat, traction, Ultrasound, gait training, Therapeutic exercise, balance training, neuromuscular re-education, patient/family education, prosthetic training, manual techniques, passive ROM, dry needling, taping, vasopnuematic device, vestibular, spinal manipulations, joint manipulations  PLAN FOR NEXT SESSION:  continue to progress functional mobility, strengthen proximal hip muscles, hip ER and lateral quad. Decrease patients pain and help minimize functional deficits. Improve gait and balance.     Oretha Caprice, PT, MPT 01/03/2022, 11:51 AM

## 2022-01-05 ENCOUNTER — Other Ambulatory Visit: Payer: Self-pay

## 2022-01-05 MED ORDER — LEVOTHYROXINE SODIUM 88 MCG PO TABS: ORAL_TABLET | ORAL | 1 refills | Status: DC

## 2022-01-10 ENCOUNTER — Ambulatory Visit (INDEPENDENT_AMBULATORY_CARE_PROVIDER_SITE_OTHER): Payer: 59 | Admitting: Physical Therapy

## 2022-01-10 ENCOUNTER — Encounter: Payer: Self-pay | Admitting: Physical Therapy

## 2022-01-10 DIAGNOSIS — G8929 Other chronic pain: Secondary | ICD-10-CM | POA: Diagnosis not present

## 2022-01-10 DIAGNOSIS — M25551 Pain in right hip: Secondary | ICD-10-CM | POA: Diagnosis not present

## 2022-01-10 DIAGNOSIS — M25552 Pain in left hip: Secondary | ICD-10-CM

## 2022-01-10 DIAGNOSIS — M5442 Lumbago with sciatica, left side: Secondary | ICD-10-CM

## 2022-01-10 DIAGNOSIS — M5441 Lumbago with sciatica, right side: Secondary | ICD-10-CM

## 2022-01-10 DIAGNOSIS — M6281 Muscle weakness (generalized): Secondary | ICD-10-CM

## 2022-01-10 DIAGNOSIS — M25651 Stiffness of right hip, not elsewhere classified: Secondary | ICD-10-CM

## 2022-01-10 NOTE — Therapy (Signed)
OUTPATIENT PHYSICAL THERAPY LOWER EXTREMITY   Patient Name: [name] MRN: [id number] DOB:[birth year], 56 y.o., female Today's Date: 01/10/2022   PT End of Session - 01/10/22 1223     Visit Number 4    Number of Visits 16    Date for PT Re-Evaluation 03/01/22    Authorization Type [employer] [facility]    PT Start Time 1145    PT Stop Time 1230    PT Time Calculation (min) 45 min    Activity Tolerance Patient tolerated treatment well    Behavior During Therapy [facility] for tasks assessed/performed                Past Medical History:  Diagnosis Date   Anxiety DX 2011   Arthritis    Depression DX 1990   Granulomatous lung disease ([place])    Grave's disease    Graves Disease   History of radioactive iodine thyroid ablation    02/21/2019   Menometrorrhagia    Migraines    Pneumonia    Past Surgical History:  Procedure Laterality Date   ABDOMINAL HYSTERECTOMY     TOTAL HIP ARTHROPLASTY Right 11/04/2021   Procedure: RIGHT TOTAL HIP ARTHROPLASTY ANTERIOR APPROACH;  Surgeon: Mcarthur Rossetti, MD;  Location: [facility];  Service: Orthopedics;  Laterality: Right;   TUBAL LIGATION     Patient Active Problem List   Diagnosis Date Noted   Status post total replacement of right hip 11/04/2021   Syphilis 09/08/2021   Hand, foot and mouth disease 09/08/2021   Unilateral primary osteoarthritis, right hip 08/31/2021   Dysphagia 07/01/2020   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   Graves disease 04/10/2019   Prediabetes 10/18/2018   Tinea pedis 07/31/2014   Screening for HIV (human immunodeficiency virus) 07/31/2014   Cramp of limb 01/13/2011   Sarcoidosis of lung ([place]) 12/30/2010   Rhinitis, allergic 11/29/2010   Urticaria, idiopathic 10/27/2010   COPD, mild ([place]) 08/06/2010   Graves' disease 06/03/2010   MIGRAINE HEADACHE 11/12/2008   ROTATOR CUFF TEAR 11/12/2008   DEPRESSION, RECURRENT 05/12/2008   ANXIETY DISORDER, GENERALIZED 02/05/2007   TOBACCO ABUSE  02/05/2007    PCP: Ladell Pier, MD  REFERRING PROVIDER: Mcarthur Rossetti, MD  REFERRING DIAG: Status post total replacement of right hip [phone]  THERAPY DIAG:  Muscle weakness (generalized)  Stiffness of right hip, not elsewhere classified  Pain in right hip  Pain in left hip  Chronic bilateral low back pain with bilateral sciatica  Rationale for Evaluation and Treatment Rehabilitation  ONSET DATE: 11/04/2021  SUBJECTIVE:   SUBJECTIVE STATEMENT: Pt reporting pain of 7/10 in Rt anterior/lateral hip and into her right groin.   PERTINENT HISTORY: Anxiety, depression, arthritis, migraines, graves disease.   PAIN:  Are you having pain? Yes: NPRS scale: 7/10 Pain location: R groin and lateral thigh.  Pain description: Dull ache.  Aggravating factors: siting/ standing for prolonged periods.  Relieving factors: Moving about to minimize stiffness.   PRECAUTIONS: None  WEIGHT BEARING RESTRICTIONS No  FALLS:  Has patient fallen in last 6 months? No  LIVING ENVIRONMENT: Lives with: lives with their family Lives in: House/apartment Stairs: Yes: Internal: 14 steps; on right going up Has following equipment at home: None  OCCUPATION: Chartered certified accountant.   PLOF: Independent  PATIENT GOALS Pt would like to minimize pain and return to PLOF.    OBJECTIVE:   DIAGNOSTIC FINDINGS: OA of R hip  PATIENT SURVEYS:  FOTO 44%, predicted 66% in 13 visits.  COGNITION:  Overall cognitive status: Within functional limits for tasks assessed     SENSATION: WFL  POSTURE: No Significant postural limitations  PALPATION: Pt is tender to palpation surrounding surgical site in hip flexors and abductors.   LOWER EXTREMITY ROM:  Active ROM Right eval Left eval Rt 01/03/22 Rt 01/10/22  Hip flexion 120 120 120 115  limited by pain  Hip extension      Hip abduction PROM 40 PROM 50 44 40 Limited by pain  Hip adduction      Hip internal rotation  [facility]    Hip  external rotation  [facility]    Knee flexion [facility] WFL    Knee extension WFL WFL     (Blank rows = not tested)  LOWER EXTREMITY MMT:  MMT Right eval Left eval  Hip flexion 4- 4-  Hip extension    Hip abduction Withheld due to pain   Hip adduction    Knee flexion 5 5  Knee extension 4+ 5   (Blank rows = not tested)  FUNCTIONAL TESTS:  5 times sit to stand: 15.26 sec Timed up and go (TUG): 15.16 sec   GAIT: Distance walked: 100 ft Assistive device utilized: None Level of assistance: Complete Independence Comments: Pt walks with slow antalgic gait pattern with decreased step length and stance on R LE.    TODAY'S TREATMENT: 01/10/2022: Therapeutic Exercise: Nustep Level 6, x  7 min Calf stretch on slant board x 2 holding 30 sec Standing hip adductor stretch x 3 holding 20 seconds each LE Seated HS stretch x 2 holding 30 sec Seated IT band stretch x 2 holding 30 sec Isometric hip abduction x 10 holding 5 seconds Modalities: E-stim to Rt IT band and proximal thigh x 15 minutes with moist heat  01/03/2022: Therapeutic Exercise: Nustep Level 6, x  6 min Calf stretch on slant board x 2 holding 30 sec Leg Press: 56# bil LE's 2  x10 Leg Press: 25# Rt LE only x 10 and 31# x 10 LTR x 3 holding 20 seconds Seated HS stretch x 2 holding 30 sec Seated IT band stretch x 2 holding 30 sec Seated SLR 2 x 10 Rt LE only IT band stretch with strap x 2 30 sec hold  Sit to stand x 10 Manual Therapy: IASTM to hip flexor and lateral hip musculature Long axis distraction x 3 holding 30 sec Modalities: 5 min heat to R hip     [facility] Adult PT Treatment:                                                DATE: 12/29/2021 Therapeutic Exercise: Nustep lvl 5, 5 min Heel slides with strap x20  LTR x20 HS stretch with strap x2 30" hold IT band stretch with strap x2 30" hold to pt tolerance. VC for a stretch and to go slow.  Sit to stand  Manual Therapy: STM to hip flexor.  Long axis distraction due  to reports of pinching in hip.  Neuromuscular re-ed: Supine clams with GTB x20 Supine marching with GTB 1 min x2  SLR with quad activation 2x10 BIL Modalities: 8 min heat to R hip    PATIENT EDUCATION:  Education details: Educated pt on E-stim and purpose Person educated: Patient Education method: Customer service manager Education comprehension: verbalized understanding and returned demonstration  HOME EXERCISE PROGRAM: Access Code: [email or web address] URL: [email or web address] Date: 12/27/2021 Prepared by: Rudi Heap  Exercises - Clamshell  - 2 x daily - 7 x weekly - 2 sets - 10 reps - Modified Thomas Stretch  - 2 x daily - 7 x weekly - 2 sets - 2 reps - 30 hold  ASSESSMENT:  CLINICAL IMPRESSION:  Pt arriving stating she may have over did it last visit with her exercises. This session was focused on gentle strengthening and more stretching of her hips. Pt tolerating E-stim c moist heat well reporting a decrease her pain from 7/10 to 5-6/10. No changes made this session to pt's HEP. Pt encouraged to continue to perform self soft tissue massage over her incision site to help break up scare tissue. Continue skilled PT to maximize pt's function.     OBJECTIVE IMPAIRMENTS: decreased activity tolerance, difficulty walking, decreased balance, decreased endurance, decreased mobility, decreased ROM, decreased strength, impaired flexibility, impaired LE use, and pain.  ACTIVITY LIMITATIONS: bending, lifting, carry, locomotion, cleaning, community activity, driving, and or occupation  PERSONAL FACTORS: Anxiety, depression, arthritis, migraines, graves disease.  are also affecting patient's functional outcome.  REHAB POTENTIAL: Good  CLINICAL DECISION MAKING: Stable/uncomplicated  EVALUATION COMPLEXITY: Low   GOALS: Short term PT Goals Target date: 02/07/2022 Pt will be I and compliant with HEP. Baseline:  Goal status: on-going 01/10/22  Pt will decrease pain by  25% overall Baseline: Goal status: on-going 01/10/2022  Long term PT goals Target date: 03/07/2022 Pt will improve  hip/knee strength to at least 5-/5 MMT to improve functional strength Baseline: Goal status: New  Pt will improve FOTO to at least 66% functional to show improved function Baseline: Goal status: New  Pt will reduce pain by overall 50% overall with usual activity Baseline: Goal status: New  Pt will reduce pain to overall less than 2-3/10 with usual activity and work activity. Baseline: Goal status: New  Pt will be able to ambulate community distances at least 1000 ft WNL gait pattern without complaints Baseline: Goal status: New        6. Pt will improve TUG by 3.4 seconds or Minimal clinically important difference.        Baseline:        Goal status: New  PLAN: PT FREQUENCY: 1-2 times per week   PT DURATION: 6-8 weeks  PLANNED INTERVENTIONS (unless contraindicated): aquatic PT, Canalith repositioning, cryotherapy, Electrical stimulation, Iontophoresis with 4 mg/ml dexamethasome, Moist heat, traction, Ultrasound, gait training, Therapeutic exercise, balance training, neuromuscular re-education, patient/family education, prosthetic training, manual techniques, passive ROM, dry needling, taping, vasopnuematic device, vestibular, spinal manipulations, joint manipulations  PLAN FOR NEXT SESSION:   Hip flexor stretch, IT band stretch, hip adductor stretch and overall bil hip strengthening. E-stim and manual therapy as needed.    Oretha Caprice, PT, MPT 01/10/2022, 12:24 PM

## 2022-01-11 NOTE — Progress Notes (Signed)
Name: [name]  MRN/ DOB: [id number], 25-Feb-1966    Age/ Sex: 56 y.o., female     PCP: Ladell Pier, MD   Reason for Endocrinology Evaluation: [name]' Disease     Initial Endocrinology Clinic Visit: 01/09/2019    PATIENT IDENTIFIER: [name] is a 56 y.o., female with a past medical history of granulomatous lung disease, headaches, graves' disease and anxiety  . She has followed with [facility] since 01/09/2019  for consultative assistance with management of her graves' disease     HISTORICAL SUMMARY: The patient was first diagnosed with Graves' disease ~2012  .Over the years she has been prescribed  methimazole , but would take it on and off due to non-compliance issues , her thyroid condition has never been optimally controlled.   On her initial visit to our clinic she was not taking methimazole for ~ 3 months prior to her presentation.   She is S/P RAI ablation on 02/21/2019 with 13.8 mCi I-131 sodium iodide LT- 4 replacement started 04/2019 due to low FT4.   No FH of thyroid disease  Sister with lupus   SUBJECTIVE:     Today (01/12/2022):  [name] is here for a follow up on hyperthyroidism . 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  saw ophthalmology but she was not happy with the experience         Levothyroxine 88 mcg , Half a tablet on Sundays, 1 tablet the rest of the week    HISTORY:  Past Medical History:  Past Medical History:  Diagnosis Date  . Anxiety DX 2011  . Arthritis   . Depression DX 1990  . Granulomatous lung disease ([place])   . Grave's disease    Graves Disease  . History of radioactive iodine thyroid ablation    02/21/2019  .  Menometrorrhagia   . Migraines   . Pneumonia    Past Surgical History:  Past Surgical History:  Procedure Laterality Date  . ABDOMINAL HYSTERECTOMY    . TOTAL HIP ARTHROPLASTY Right 11/04/2021   Procedure: RIGHT TOTAL HIP ARTHROPLASTY ANTERIOR APPROACH;  Surgeon: Mcarthur Rossetti, MD;  Location: [facility];  Service: Orthopedics;  Laterality: Right;  . TUBAL LIGATION     Social History:  reports that she has been smoking cigarettes. She has a 16.00 pack-year smoking history. She has never used smokeless tobacco. She reports current alcohol use. She reports that she does not use drugs. Family History:  Family History  Problem Relation Age of Onset  . Hypertension Mother   . Heart attack Mother   . Hypertension Brother   . Hypertension Father   . Allergies Father   . Allergies Other        children  . Breast cancer Maternal Aunt   . Breast cancer Maternal Aunt      HOME MEDICATIONS: Allergies as of 01/12/2022   No Known Allergies      Medication List        Accurate as of January 12, 2022  8:20 AM. If you have any questions, ask your nurse or doctor.          STOP taking these medications    predniSONE 20 MG tablet Commonly  known as: DELTASONE Stopped by: Dorita Sciara, MD       TAKE these medications    aspirin 81 MG chewable tablet Chew 1 tablet (81 mg total) by mouth 2 (two) times daily.   ferrous sulfate 325 (65 FE) MG tablet Take 325 mg by mouth 3 (three) times daily with meals.   ibuprofen 800 MG tablet Commonly known as: ADVIL Take 1 tablet (800 mg total) by mouth every 8 (eight) hours as needed.   levothyroxine 88 MCG tablet Commonly known as: SYNTHROID TAKE 1 TABLET BY MOUTH EVERY DAY ON DAYS MONDAY- SATURDAY AND TAKE HALF TABLET ON SUNDAYS AS DIRECTED   meloxicam 15 MG tablet Commonly known as: MOBIC Take 1 tablet (15 mg total) by mouth daily as needed for pain.   oxyCODONE 5 MG immediate release tablet Commonly known as: Oxy  IR/ROXICODONE Take 1-2 tablets (5-10 mg total) by mouth every 6 (six) hours as needed for moderate pain (pain score 4-6).   tiZANidine 4 MG tablet Commonly known as: Zanaflex Take 1 tablet (4 mg total) by mouth every 8 (eight) hours as needed for muscle spasms.   triamcinolone cream 0.1 % Commonly known as: KENALOG Apply 1 Application topically 2 (two) times daily. Apply to affected area(s) twice daily , do not apply to face.   Vitamin D (Ergocalciferol) 1.25 MG (50000 UNIT) Caps capsule Commonly known as: DRISDOL Take 1 capsule (50,000 Units total) by mouth every 7 (seven) days.          OBJECTIVE:   PHYSICAL EXAM: VS: BP 122/80 (BP Location: Left Arm, Patient Position: Sitting, Cuff Size: Small)   Pulse 83   Ht 5' 8.5" (1.74 m)   Wt 151 lb 6.4 oz (68.7 kg)   LMP 04/23/2011   SpO2 99%   BMI 22.69 kg/m    EXAM: General: Pt appears well and is in NAD External eye exam normal without stare, lid lag or exophthalmos.  EOM intact.    Neck: General: Supple without adenopathy. Thyroid: Thyroid size normal.  No goiter  appreciated. No thyroid bruit.  Lungs: Clear with good BS bilat with no rales, rhonchi, or wheezes  Heart: Auscultation: RRR.  Abdomen: Normoactive bowel sounds, soft, nontender, without masses or organomegaly palpable  Extremities:  BL LE: No pretibial edema normal ROM and strength.  Mental Status: Judgment, insight: Intact Orientation: Oriented to time, place, and person Mood and affect: No depression, anxiety, or agitation     DATA REVIEWED:   Latest Reference Range & Units 01/12/22 08:43  TSH 0.35 - 5.50 uIU/mL 1.33  T4,Free(Direct) 0.60 - 1.60 ng/dL 1.01     ASSESSMENT / PLAN / RECOMMENDATIONS:   Postablative Hypothyroidism  - She is S/P RAI Ablation on 02/21/2019 with 13.8 mCi I-131 sodium iodide - She is clinically euthyroid  - TSH normal   Medications   Continue  Levothyroxine 88 mcg , Half a tablet on Sundays, 1 tablet the rest of  the week   2. Graves' Disease:   -Patient endorses periorbital swelling as well as burning and itching of the eyes over the past year.  I had sent her to Dr. Leonard Schwartz due to history of Graves' disease, she had a pending CT scan but due to the office dynamic she was not able to have it and does not want to go back there -I will refer her to another ophthalmology office for further management  F/U in 1 year    Signed electronically by: Mack Guise, MD  [facility] Endocrinology  [facility] [address] [place], [place] [address] Phone: [phone] FAX: [phone]      CC: Ladell Pier, MD [address] [place] [address] Phone: [phone]  Fax: [phone]   Return to Endocrinology clinic as below: Future Appointments  Date Time Provider [place]  01/13/2022 11:00 AM Laureen Abrahams, PT [facility] None  01/16/2022  9:45 AM Mcarthur Rossetti, MD [facility] None  01/17/2022 11:45 AM Lynden Ang, PT [facility] None  01/19/2022 11:45 AM Lynden Ang, PT [facility] None  01/23/2022 11:45 AM Oretha Caprice, PT [facility] None  01/25/2022 11:45 AM Lynden Ang, PT [facility] None  02/10/2022 10:30 AM Ladell Pier, MD [facility] None

## 2022-01-12 ENCOUNTER — Ambulatory Visit: Payer: 59 | Admitting: Internal Medicine

## 2022-01-12 ENCOUNTER — Encounter: Payer: Self-pay | Admitting: Internal Medicine

## 2022-01-12 VITALS — BP 122/80 | HR 83 | Ht 68.5 in | Wt 151.4 lb

## 2022-01-12 DIAGNOSIS — E0500 Thyrotoxicosis with diffuse goiter without thyrotoxic crisis or storm: Secondary | ICD-10-CM

## 2022-01-12 DIAGNOSIS — E890 Postprocedural hypothyroidism: Secondary | ICD-10-CM | POA: Diagnosis not present

## 2022-01-12 LAB — T4, FREE: Free T4: 1.01 ng/dL (ref 0.60–1.60)

## 2022-01-12 LAB — TSH: TSH: 1.33 u[IU]/mL (ref 0.35–5.50)

## 2022-01-12 MED ORDER — LEVOTHYROXINE SODIUM 88 MCG PO TABS: ORAL_TABLET | ORAL | 3 refills | Status: DC

## 2022-01-12 NOTE — Patient Instructions (Signed)

## 2022-01-13 ENCOUNTER — Encounter: Payer: 59 | Admitting: Physical Therapy

## 2022-01-16 ENCOUNTER — Encounter: Payer: Self-pay | Admitting: Orthopaedic Surgery

## 2022-01-16 ENCOUNTER — Ambulatory Visit (INDEPENDENT_AMBULATORY_CARE_PROVIDER_SITE_OTHER): Payer: 59

## 2022-01-16 ENCOUNTER — Ambulatory Visit: Payer: 59 | Admitting: Orthopaedic Surgery

## 2022-01-16 DIAGNOSIS — G8929 Other chronic pain: Secondary | ICD-10-CM

## 2022-01-16 DIAGNOSIS — M25561 Pain in right knee: Secondary | ICD-10-CM

## 2022-01-16 NOTE — Progress Notes (Signed)
The patient is someone who is in follow-up from a hip replacement that we performed in early July.  She is 2-1/2 months out from surgery.  She is of the hip is doing well but she is in physical therapy.  Her right knee has been painful to her and she feels like her kneecap does not track correctly.  On my exam there is just a slight effusion of her right knee but I agree the patella seems to be tracking more medial than lateral.  She has good range of motion of the knee on the right side and she and her extensor mechanism is intact.  2 views of the right knee show no acute findings.  On the standing view the patella seems to be more oriented medial.  The joint spaces well-maintained.  She is in physical therapy and at this point I would like them to only concentrate on her right knee with anything that they can do to help the patella tracked better and strengthen the quad muscles.  She would like to go back to work starting next week so I gave her a note stating that I can be the case.  I would still like to see her back in 4 weeks to see how she is doing overall from a right knee standpoint.  All questions concerns were answered and addressed.

## 2022-01-16 NOTE — Therapy (Unsigned)
OUTPATIENT PHYSICAL THERAPY LOWER EXTREMITY   Patient Name: [name] MRN: [id number] DOB:10-16-1965, 56 y.o., female Today's Date: 01/17/2022   PT End of Session - 01/17/22 1146     Visit Number 5    Number of Visits 16    Date for PT Re-Evaluation 03/01/22    Authorization Type [employer]    PT Start Time 1145    PT Stop Time 1229    PT Time Calculation (min) 44 min    Activity Tolerance Patient tolerated treatment well    Behavior During Therapy [facility] for tasks assessed/performed                 Past Medical History:  Diagnosis Date   Anxiety DX 2011   Arthritis    Depression DX 1990   Granulomatous lung disease ([place])    Grave's disease    Graves Disease   History of radioactive iodine thyroid ablation    02/21/2019   Menometrorrhagia    Migraines    Pneumonia    Past Surgical History:  Procedure Laterality Date   ABDOMINAL HYSTERECTOMY     TOTAL HIP ARTHROPLASTY Right 11/04/2021   Procedure: RIGHT TOTAL HIP ARTHROPLASTY ANTERIOR APPROACH;  Surgeon: Mcarthur Rossetti, MD;  Location: [facility];  Service: Orthopedics;  Laterality: Right;   TUBAL LIGATION     Patient Active Problem List   Diagnosis Date Noted   Status post total replacement of right hip 11/04/2021   Syphilis 09/08/2021   Hand, foot and mouth disease 09/08/2021   Unilateral primary osteoarthritis, right hip 08/31/2021   Dysphagia 07/01/2020   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   Graves disease 04/10/2019   Prediabetes 10/18/2018   Tinea pedis 07/31/2014   Screening for HIV (human immunodeficiency virus) 07/31/2014   Cramp of limb 01/13/2011   Sarcoidosis of lung ([place]) 12/30/2010   Rhinitis, allergic 11/29/2010   Urticaria, idiopathic 10/27/2010   COPD, mild ([place]) 08/06/2010   Graves' disease 06/03/2010   MIGRAINE HEADACHE 11/12/2008   ROTATOR CUFF TEAR 11/12/2008   DEPRESSION, RECURRENT 05/12/2008   ANXIETY DISORDER, GENERALIZED 02/05/2007   TOBACCO ABUSE  02/05/2007    PCP: Ladell Pier, MD  REFERRING PROVIDER: Mcarthur Rossetti, MD  REFERRING DIAG: Status post total replacement of right hip [phone]  THERAPY DIAG:  Muscle weakness (generalized)  Stiffness of right hip, not elsewhere classified  Pain in right hip  Localized edema  Rationale for Evaluation and Treatment Rehabilitation  ONSET DATE: 11/04/2021  SUBJECTIVE:   SUBJECTIVE STATEMENT: She saw Dr. Ninfa Linden who was concerned with right knee pain with patella tracking medially.  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    PERTINENT HISTORY: Anxiety, depression, arthritis, migraines, graves disease.   PAIN:  Are you having pain? Yes: NPRS scale:  4/10  since last PT lowest 4/10 & highest 10/10 (lasting couple of hours) Pain location: R groin and lateral thigh.  Pain description: Dull ache.  Aggravating factors: siting then standing for prolonged periods.  Relieving factors: Moving about to minimize stiffness.  Right knee 6-7/10 in last week lowest 4-5/10 & highest 8/10 Location over patella especially below knee Description: sharp Aggravating factor: walking, standing 20-30 min Relieving factors: sitting or not bearing weight  PRECAUTIONS: None  WEIGHT BEARING RESTRICTIONS No  FALLS:  Has patient fallen in last 6 months? No  LIVING ENVIRONMENT: Lives with: lives with their family Lives in: House/apartment Stairs: Yes: Internal: 14 steps; on right  going up Has following equipment at home: None  OCCUPATION: Chartered certified accountant.   PLOF: Independent  PATIENT GOALS Pt would like to minimize pain and return to PLOF.    OBJECTIVE:   DIAGNOSTIC FINDINGS: OA of R hip  PATIENT SURVEYS:  12/27/2021:  FOTO 44%, predicted 66% in 13 visits.   COGNITION: Overall cognitive status: Within functional limits for tasks assessed    SENSATION: 12/27/2021:  WFL  POSTURE: No Significant postural  limitations  PALPATION: 12/27/2021:  Pt is tender to palpation surrounding surgical site in hip flexors and abductors.   LOWER EXTREMITY ROM:  Active ROM Right Eval 12/27/21 Left Eval 12/27/21 Rt 01/03/22 Rt 01/10/22  Hip flexion 120 120 120 115  limited by pain  Hip extension      Hip abduction PROM 40 PROM 50 44 40 Limited by pain  Hip adduction      Hip internal rotation  [facility]    Hip external rotation  [facility]    Knee flexion [facility]    Knee extension WFL WFL     (Blank rows = not tested)  LOWER EXTREMITY MMT:  MMT Right Eval 12/27/21 Left Eval 12/27/21  Hip flexion 4- 4-  Hip extension    Hip abduction Withheld due to pain   Hip adduction    Knee flexion 5 5  Knee extension 4+ 5   (Blank rows = not tested)  FUNCTIONAL TESTS:  12/27/2021:  5 times sit to stand: 15.26 sec 12/27/2021:  Timed up and go (TUG): 15.16 sec   GAIT: 12/27/2021:  Distance walked: 100 ft Assistive device utilized: None Level of assistance: Complete Independence Comments: Pt walks with slow antalgic gait pattern with decreased step length and stance on R LE.    TODAY'S TREATMENT: 01/17/2022: Therapeutic Exercise: Nustep Level 6 with BLEs & BUEs, x 10 min RLE SLR 10 reps pre-taping  Following Exercises with Kinesio taping RLE SLR with hip int rot to facilitate vastus lateralis with taping for patella as noted 10 reps Seated hip int rot & ext rot 10 reps ea Hamstring stretch SLR with strap 20 sec hold 1st rep neutral rotation, 2nd rep hip int rot, 3rd rep hip ext rot (reports greatest stretch) Bridge BLE with towel roll hip add 10 reps PT demo & verbal cues on weight shift with pelvis to RLE 2-3 sec hold upon arising prior to walking to load LE. Pt return demo 3 times during PT session & reports less pain spike with initial gait.  Manual Kinesio taping right patella to facilitate midline tracking (not medially where she is tracking). PT instructed in wear up to 5 days but can remove if itching or  burning under tape.  PT explained that her knee may have some initial soreness in new area as using muscles in different pattern but should start to decrease within 48 hours. Pt verbalized understanding.  ITB stretch SLR with adduction Piriformis stretch hip flexion with add     01/10/2022: Therapeutic Exercise: Nustep Level 6, x  7 min Calf stretch on slant board x 2 holding 30 sec Standing hip adductor stretch x 3 holding 20 seconds each LE Seated HS stretch x 2 holding 30 sec Seated IT band stretch x 2 holding 30 sec Isometric hip abduction x 10 holding 5 seconds Modalities: E-stim to Rt IT band and proximal thigh x 15 minutes with moist heat    01/03/2022: Therapeutic Exercise: Nustep Level 6, x  6 min Calf stretch on slant board x 2  holding 30 sec Leg Press: 56# bil LE's 2  x10 Leg Press: 25# Rt LE only x 10 and 31# x 10 LTR x 3 holding 20 seconds Seated HS stretch x 2 holding 30 sec Seated IT band stretch x 2 holding 30 sec Seated SLR 2 x 10 Rt LE only IT band stretch with strap x 2 30 sec hold  Sit to stand x 10 Manual Therapy: IASTM to hip flexor and lateral hip musculature Long axis distraction x 3 holding 30 sec Modalities: 5 min heat to R hip    PATIENT EDUCATION:  Education details: Educated pt on E-stim and purpose Person educated: Patient Education method: Customer service manager Education comprehension: verbalized understanding and returned demonstration   HOME EXERCISE PROGRAM: Access Code: [email or web address] URL: [email or web address] Date: 12/27/2021 Prepared by: Rudi Heap  Exercises - Clamshell  - 2 x daily - 7 x weekly - 2 sets - 10 reps - Modified Thomas Stretch  - 2 x daily - 7 x weekly - 2 sets - 2 reps - 30 hold  ASSESSMENT:  CLINICAL IMPRESSION:  Patient's patella appeared to track more midline with taping.  PT worked on exercises to address both right hip & knee range & strength.  She has discomfort with first couple of reps  but less pain with motions as muscle reeducation. Patient will probably have issues with returning to work full duty for 10 hour shifts next week. She may benefit from shorter days with some modifications and build up to full duty.   OBJECTIVE IMPAIRMENTS: decreased activity tolerance, difficulty walking, decreased balance, decreased endurance, decreased mobility, decreased ROM, decreased strength, impaired flexibility, impaired LE use, and pain.  ACTIVITY LIMITATIONS: bending, lifting, carry, locomotion, cleaning, community activity, driving, and or occupation  PERSONAL FACTORS: Anxiety, depression, arthritis, migraines, graves disease.  are also affecting patient's functional outcome.  REHAB POTENTIAL: Good  CLINICAL DECISION MAKING: Stable/uncomplicated  EVALUATION COMPLEXITY: Low   GOALS: Short term PT Goals Target date: 02/14/2022 Pt will be I and compliant with HEP. Baseline:  Goal status: on-going 01/10/22  Pt will decrease pain by 25% overall Baseline: Goal status: on-going 01/10/2022  Long term PT goals Target date: 03/14/2022 Pt will improve  hip/knee strength to at least 5-/5 MMT to improve functional strength Baseline: Goal status: New  Pt will improve FOTO to at least 66% functional to show improved function Baseline: Goal status: New  Pt will reduce pain by overall 50% overall with usual activity Baseline: Goal status: New  Pt will reduce pain to overall less than 2-3/10 with usual activity and work activity. Baseline: Goal status: New  Pt will be able to ambulate community distances at least 1000 ft WNL gait pattern without complaints Baseline: Goal status: New  6. Pt will improve TUG by 3.4 seconds or Minimal clinically important difference.        Baseline:        Goal status: New  PLAN: PT FREQUENCY: 1-2 times per week   PT DURATION: 6-8 weeks  PLANNED INTERVENTIONS (unless contraindicated): aquatic PT, Canalith repositioning, cryotherapy,  Electrical stimulation, Iontophoresis with 4 mg/ml dexamethasome, Moist heat, traction, Ultrasound, gait training, Therapeutic exercise, balance training, neuromuscular re-education, patient/family education, prosthetic training, manual techniques, passive ROM, dry needling, taping, vasopnuematic device, vestibular, spinal manipulations, joint manipulations  PLAN FOR NEXT SESSION:   Check if taping has helped knee, work on exercises & therapeutic activities to assist with return to work, Hip flexor stretch, IT band stretch, hip adductor stretch and overall  bil hip strengthening. E-stim and manual therapy as needed.   Note was started by Lynden Ang, PT, MPT but treatment transferred to Jamey Reas, PT, DPT who completed & wrote note for PT treatment.     Jamey Reas, PT, DPT 01/17/2022, 12:44 PM

## 2022-01-17 ENCOUNTER — Ambulatory Visit (INDEPENDENT_AMBULATORY_CARE_PROVIDER_SITE_OTHER): Payer: 59 | Admitting: Physical Therapy

## 2022-01-17 ENCOUNTER — Encounter: Payer: Self-pay | Admitting: Physical Therapy

## 2022-01-17 DIAGNOSIS — M25651 Stiffness of right hip, not elsewhere classified: Secondary | ICD-10-CM | POA: Diagnosis not present

## 2022-01-17 DIAGNOSIS — R600 Localized edema: Secondary | ICD-10-CM

## 2022-01-17 DIAGNOSIS — M25551 Pain in right hip: Secondary | ICD-10-CM

## 2022-01-17 DIAGNOSIS — M6281 Muscle weakness (generalized): Secondary | ICD-10-CM

## 2022-01-17 NOTE — Therapy (Addendum)
OUTPATIENT PHYSICAL THERAPY TREATMENT/ DISCHARGE   Patient Name: [name] MRN: [id number] DOB:07-Apr-1966, 56 y.o., female Today's Date: 01/19/2022   PT End of Session - 01/19/22 1252     Visit Number 6    Number of Visits 16    Date for PT Re-Evaluation 03/01/22    Authorization Type [employer]    PT Start Time 1147    PT Stop Time 1230    PT Time Calculation (min) 43 min    Activity Tolerance Patient tolerated treatment well    Behavior During Therapy [facility] for tasks assessed/performed             Past Medical History:  Diagnosis Date   Anxiety DX 2011   Arthritis    Depression DX 1990   Granulomatous lung disease ([place])    Grave's disease    Graves Disease   History of radioactive iodine thyroid ablation    02/21/2019   Menometrorrhagia    Migraines    Pneumonia    Past Surgical History:  Procedure Laterality Date   ABDOMINAL HYSTERECTOMY     TOTAL HIP ARTHROPLASTY Right 11/04/2021   Procedure: RIGHT TOTAL HIP ARTHROPLASTY ANTERIOR APPROACH;  Surgeon: Mcarthur Rossetti, MD;  Location: [facility];  Service: Orthopedics;  Laterality: Right;   TUBAL LIGATION     Patient Active Problem List   Diagnosis Date Noted   Status post total replacement of right hip 11/04/2021   Syphilis 09/08/2021   Hand, foot and mouth disease 09/08/2021   Unilateral primary osteoarthritis, right hip 08/31/2021   Dysphagia 07/01/2020   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   Graves disease 04/10/2019   Prediabetes 10/18/2018   Tinea pedis 07/31/2014   Screening for HIV (human immunodeficiency virus) 07/31/2014   Cramp of limb 01/13/2011   Sarcoidosis of lung ([place]) 12/30/2010   Rhinitis, allergic 11/29/2010   Urticaria, idiopathic 10/27/2010   COPD, mild ([place]) 08/06/2010   Graves' disease 06/03/2010   MIGRAINE HEADACHE 11/12/2008   ROTATOR CUFF TEAR 11/12/2008   DEPRESSION, RECURRENT 05/12/2008   ANXIETY DISORDER, GENERALIZED 02/05/2007   TOBACCO ABUSE  02/05/2007    PCP: Ladell Pier, MD  REFERRING PROVIDER: Mcarthur Rossetti, MD  REFERRING DIAG: Status post total replacement of right hip [phone]  THERAPY DIAG:  Muscle weakness (generalized)  Stiffness of right hip, not elsewhere classified  Pain in right hip  Localized edema  Rationale for Evaluation and Treatment Rehabilitation  ONSET DATE: 11/04/2021  SUBJECTIVE:   SUBJECTIVE STATEMENT: Pt states that she continues to have pain in her R knee and hip. She reports more pain in her knee today than her hip.   PERTINENT HISTORY: Anxiety, depression, arthritis, migraines, graves disease.   PAIN:  Are you having pain? Yes: NPRS scale:  4/10  since last PT lowest 4/10 & highest 10/10 (lasting couple of hours) Pain location: R groin and lateral thigh.  Pain description: Dull ache.  Aggravating factors: siting then standing for prolonged periods.  Relieving factors: Moving about to minimize stiffness.  Right knee 6-7/10 in last week lowest 4-5/10 & highest 8/10 Location over patella especially below knee Description: sharp Aggravating factor: walking, standing 20-30 min Relieving factors: sitting or not bearing weight  PRECAUTIONS: None  WEIGHT BEARING RESTRICTIONS No  FALLS:  Has patient fallen in last 6 months? No  LIVING ENVIRONMENT: Lives with: lives with their family Lives in: House/apartment Stairs: Yes: Internal: 14 steps; on right going up Has following equipment at home: None  OCCUPATION:  Chartered certified accountant.   PLOF: Independent  PATIENT GOALS Pt would like to minimize pain and return to PLOF.    OBJECTIVE:   DIAGNOSTIC FINDINGS: OA of R hip  PATIENT SURVEYS:  12/27/2021:  FOTO 44%, predicted 66% in 13 visits.   COGNITION: Overall cognitive status: Within functional limits for tasks assessed    SENSATION: 12/27/2021:  WFL  POSTURE: No Significant postural limitations  PALPATION: 12/27/2021:  Pt is tender to palpation surrounding  surgical site in hip flexors and abductors.   LOWER EXTREMITY ROM:  Active ROM Right Eval 12/27/21 Left Eval 12/27/21 Rt 01/03/22 Rt 01/10/22  Hip flexion 120 120 120 115  limited by pain  Hip extension      Hip abduction PROM 40 PROM 50 44 40 Limited by pain  Hip adduction      Hip internal rotation  [facility]    Hip external rotation  [facility]    Knee flexion [facility]    Knee extension [facility] WFL     (Blank rows = not tested)  LOWER EXTREMITY MMT:  MMT Right Eval 12/27/21 Left Eval 12/27/21  Hip flexion 4- 4-  Hip extension    Hip abduction Withheld due to pain   Hip adduction    Knee flexion 5 5  Knee extension 4+ 5   (Blank rows = not tested)  FUNCTIONAL TESTS:  12/27/2021:  5 times sit to stand: 15.26 sec 12/27/2021:  Timed up and go (TUG): 15.16 sec   GAIT: 12/27/2021:  Distance walked: 100 ft Assistive device utilized: None Level of assistance: Complete Independence Comments: Pt walks with slow antalgic gait pattern with decreased step length and stance on R LE.    TODAY'S TREATMENT: 01/19/2022:  Therapeutic Exercise: Nustep Level 6 with BLEs & BUEs, x 10 min  Following Exercises with Kinesio taping RLE SLR with hip int rot to facilitate vastus lateralis with taping for patella as noted 10 reps Bridge BLE with towel roll hip add 10 reps Sideyling clams 1x10 bilat   Manual Kinesio taping R LE with medial to lateral pull facilitate midline tracking. PT instructed in wear up to 5 days but can remove if itching or burning under tape. PT explained that her knee may have some initial soreness in new area as using muscles in different pattern but should start to decrease within 48 hours. Pt verbalized understanding.  STM to hip flexor due to residual pain and tightness from THA   01/17/2022: Therapeutic Exercise: Nustep Level 6 with BLEs & BUEs, x 10 min RLE SLR 10 reps pre-taping  Following Exercises with Kinesio taping RLE SLR with hip int rot to facilitate vastus  lateralis with taping for patella as noted 10 reps Seated hip int rot & ext rot 10 reps ea Hamstring stretch SLR with strap 20 sec hold 1st rep neutral rotation, 2nd rep hip int rot, 3rd rep hip ext rot (reports greatest stretch) Bridge BLE with towel roll hip add 10 reps PT demo & verbal cues on weight shift with pelvis to RLE 2-3 sec hold upon arising prior to walking to load LE. Pt return demo 3 times during PT session & reports less pain spike with initial gait.  Manual Kinesio taping right patella to facilitate midline tracking (not medially where she is tracking). PT instructed in wear up to 5 days but can remove if itching or burning under tape.  PT explained that her knee may have some initial soreness in new area as using muscles in different pattern  but should start to decrease within 48 hours. Pt verbalized understanding.  ITB stretch SLR with adduction Piriformis stretch hip flexion with add  01/17/2022: Therapeutic Exercise: Nustep Level 6 with BLEs & BUEs, x 10 min RLE SLR 10 reps pre-taping  Following Exercises with Kinesio taping RLE SLR with hip int rot to facilitate vastus lateralis with taping for patella as noted 10 reps Seated hip int rot & ext rot 10 reps ea Hamstring stretch SLR with strap 20 sec hold 1st rep neutral rotation, 2nd rep hip int rot, 3rd rep hip ext rot (reports greatest stretch) Bridge BLE with towel roll hip add 10 reps PT demo & verbal cues on weight shift with pelvis to RLE 2-3 sec hold upon arising prior to walking to load LE. Pt return demo 3 times during PT session & reports less pain spike with initial gait.  Manual Kinesio taping right patella to facilitate midline tracking (not medially where she is tracking). PT instructed in wear up to 5 days but can remove if itching or burning under tape.  PT explained that her knee may have some initial soreness in new area as using muscles in different pattern but should start to decrease within 48 hours. Pt  verbalized understanding.  ITB stretch SLR with adduction Piriformis stretch hip flexion with add     01/10/2022: Therapeutic Exercise: Nustep Level 6, x  7 min Calf stretch on slant board x 2 holding 30 sec Standing hip adductor stretch x 3 holding 20 seconds each LE Seated HS stretch x 2 holding 30 sec Seated IT band stretch x 2 holding 30 sec Isometric hip abduction x 10 holding 5 seconds Modalities: E-stim to Rt IT band and proximal thigh x 15 minutes with moist heat    PATIENT EDUCATION:  Education details: Educated pt on E-stim and purpose Person educated: Patient Education method: Customer service manager Education comprehension: verbalized understanding and returned demonstration   HOME EXERCISE PROGRAM: Access Code: [email or web address] URL: [email or web address] Date: 12/27/2021 Prepared by: Rudi Heap  Exercises - Clamshell  - 2 x daily - 7 x weekly - 2 sets - 10 reps - Modified Thomas Stretch  - 2 x daily - 7 x weekly - 2 sets - 2 reps - 30 hold  ASSESSMENT:  CLINICAL IMPRESSION:  Patient's patella appeared to track more midline with taping, pt also reporting a decrease in pain.  PT worked on exercises to address both right hip & knee range & strength. Pt reports pain only noted with side-lying clams on R side. Patient will probably have issues with returning to work full duty for 10 hour shifts next week. She may benefit from shorter days with some modifications and build up to full duty.   OBJECTIVE IMPAIRMENTS: decreased activity tolerance, difficulty walking, decreased balance, decreased endurance, decreased mobility, decreased ROM, decreased strength, impaired flexibility, impaired LE use, and pain.  ACTIVITY LIMITATIONS: bending, lifting, carry, locomotion, cleaning, community activity, driving, and or occupation  PERSONAL FACTORS: Anxiety, depression, arthritis, migraines, graves disease.  are also affecting patient's functional outcome.  REHAB  POTENTIAL: Good  CLINICAL DECISION MAKING: Stable/uncomplicated  EVALUATION COMPLEXITY: Low   GOALS: Short term PT Goals Target date: 02/16/2022 Pt will be I and compliant with HEP. Baseline:  Goal status: on-going 01/10/22  Pt will decrease pain by 25% overall Baseline: Goal status: on-going 01/10/2022  Long term PT goals Target date: 03/16/2022 Pt will improve  hip/knee strength to at least 5-/5 MMT to improve functional strength Baseline: Goal status:  New  Pt will improve FOTO to at least 66% functional to show improved function Baseline: Goal status: New  Pt will reduce pain by overall 50% overall with usual activity Baseline: Goal status: New  Pt will reduce pain to overall less than 2-3/10 with usual activity and work activity. Baseline: Goal status: New  Pt will be able to ambulate community distances at least 1000 ft WNL gait pattern without complaints Baseline: Goal status: New  6. Pt will improve TUG by 3.4 seconds or Minimal clinically important difference.        Baseline:        Goal status: New  PLAN: PT FREQUENCY: 1-2 times per week   PT DURATION: 6-8 weeks  PLANNED INTERVENTIONS (unless contraindicated): aquatic PT, Canalith repositioning, cryotherapy, Electrical stimulation, Iontophoresis with 4 mg/ml dexamethasome, Moist heat, traction, Ultrasound, gait training, Therapeutic exercise, balance training, neuromuscular re-education, patient/family education, prosthetic training, manual techniques, passive ROM, dry needling, taping, vasopnuematic device, vestibular, spinal manipulations, joint manipulations  PLAN FOR NEXT SESSION:   Check if taping has helped knee, work on exercises & therapeutic activities to assist with return to work, Hip flexor stretch, IT band stretch, hip adductor stretch and overall bil hip strengthening. E-stim and manual therapy as needed.   Note was started by Lynden Ang, PT, MPT but treatment transferred to Jamey Reas,  PT, DPT who completed & wrote note for PT treatment.     Lynden Ang, PT, DPT 01/19/2022, 12:54 PM   PHYSICAL THERAPY DISCHARGE SUMMARY  Visits from Start of Care: 6  Current functional level related to goals / functional outcomes: See note   Remaining deficits: See note   Education / Equipment: HEP  Patient goals were partially met. Patient is being discharged due to not returning since the last visit.  Scot Jun, PT, DPT, OCS, ATC 03/08/22  9:50 AM

## 2022-01-19 ENCOUNTER — Ambulatory Visit: Payer: 59 | Admitting: Physical Therapy

## 2022-01-19 ENCOUNTER — Encounter: Payer: Self-pay | Admitting: Physical Therapy

## 2022-01-19 DIAGNOSIS — M6281 Muscle weakness (generalized): Secondary | ICD-10-CM | POA: Diagnosis not present

## 2022-01-19 DIAGNOSIS — M25651 Stiffness of right hip, not elsewhere classified: Secondary | ICD-10-CM

## 2022-01-19 DIAGNOSIS — M25551 Pain in right hip: Secondary | ICD-10-CM | POA: Diagnosis not present

## 2022-01-19 DIAGNOSIS — R600 Localized edema: Secondary | ICD-10-CM

## 2022-01-23 ENCOUNTER — Encounter: Payer: 59 | Admitting: Physical Therapy

## 2022-01-25 ENCOUNTER — Encounter: Payer: 59 | Admitting: Physical Therapy

## 2022-02-04 ENCOUNTER — Other Ambulatory Visit: Payer: Self-pay | Admitting: Orthopaedic Surgery

## 2022-02-10 ENCOUNTER — Ambulatory Visit: Payer: 59 | Attending: Internal Medicine | Admitting: Internal Medicine

## 2022-02-10 VITALS — BP 151/90 | HR 81 | Temp 98.2°F | Ht 68.5 in | Wt 154.6 lb

## 2022-02-10 DIAGNOSIS — M25551 Pain in right hip: Secondary | ICD-10-CM | POA: Diagnosis not present

## 2022-02-10 DIAGNOSIS — J0110 Acute frontal sinusitis, unspecified: Secondary | ICD-10-CM

## 2022-02-10 DIAGNOSIS — D508 Other iron deficiency anemias: Secondary | ICD-10-CM | POA: Diagnosis not present

## 2022-02-10 DIAGNOSIS — Z23 Encounter for immunization: Secondary | ICD-10-CM

## 2022-02-10 DIAGNOSIS — N951 Menopausal and female climacteric states: Secondary | ICD-10-CM

## 2022-02-10 DIAGNOSIS — E559 Vitamin D deficiency, unspecified: Secondary | ICD-10-CM

## 2022-02-10 DIAGNOSIS — Z1231 Encounter for screening mammogram for malignant neoplasm of breast: Secondary | ICD-10-CM

## 2022-02-10 DIAGNOSIS — R030 Elevated blood-pressure reading, without diagnosis of hypertension: Secondary | ICD-10-CM | POA: Diagnosis not present

## 2022-02-10 DIAGNOSIS — F17200 Nicotine dependence, unspecified, uncomplicated: Secondary | ICD-10-CM

## 2022-02-10 DIAGNOSIS — R7303 Prediabetes: Secondary | ICD-10-CM

## 2022-02-10 MED ORDER — GABAPENTIN 300 MG PO CAPS: 300.0000 mg | ORAL_CAPSULE | Freq: Every day | ORAL | 3 refills | Status: DC

## 2022-02-10 MED ORDER — VITAMIN D (ERGOCALCIFEROL) 1.25 MG (50000 UNIT) PO CAPS: 50000.0000 [IU] | ORAL_CAPSULE | ORAL | 0 refills | Status: DC

## 2022-02-10 MED ORDER — VARENICLINE TARTRATE (STARTER) 0.5 MG X 11 & 1 MG X 42 PO TBPK: ORAL_TABLET | ORAL | 0 refills | Status: DC

## 2022-02-10 MED ORDER — AMOXICILLIN-POT CLAVULANATE 500-125 MG PO TABS: 1.0000 | ORAL_TABLET | Freq: Two times a day (BID) | ORAL | 0 refills | Status: DC

## 2022-02-10 MED ORDER — FERROUS SULFATE 325 (65 FE) MG PO TABS: 325.0000 mg | ORAL_TABLET | Freq: Every day | ORAL | 1 refills | Status: DC

## 2022-02-10 NOTE — Progress Notes (Unsigned)
Having pain in right hip Hot flashes.

## 2022-02-10 NOTE — Patient Instructions (Addendum)
We have placed you on the antibiotics called Augmentin for the sinus infection. I would purchase some Claritin over-the-counter and take 1 daily.  Decrease use of Flonase to once a day. We have referred you to an ear nose and throat specialist.  We have started you on a medication called gabapentin to take at bedtime to help decrease the hot flashes.  We have started you on Chantix to help decrease cravings for cigarettes and to help you quit smoking.  Once you have finished with the first pack, please let me know so that we can send prescription for the continuation pack.  Please keep your appointment with orthopedics next week.
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
## 2022-02-11 DIAGNOSIS — D649 Anemia, unspecified: Secondary | ICD-10-CM | POA: Insufficient documentation

## 2022-02-11 LAB — CBC
Hematocrit: 39.2 % (ref 34.0–46.6)
Hemoglobin: 12.5 g/dL (ref 11.1–15.9)
MCH: 27.8 pg (ref 26.6–33.0)
MCHC: 31.9 g/dL (ref 31.5–35.7)
MCV: 87 fL (ref 79–97)
Platelets: 396 10*3/uL (ref 150–450)
RBC: 4.49 x10E6/uL (ref 3.77–5.28)
RDW: 13.6 % (ref 11.7–15.4)
WBC: 4.7 10*3/uL (ref 3.4–10.8)

## 2022-02-11 LAB — IRON,TIBC AND FERRITIN PANEL
Ferritin: 41 ng/mL (ref 15–150)
Iron Saturation: 10 % — ABNORMAL LOW (ref 15–55)
Iron: 40 ug/dL (ref 27–159)
Total Iron Binding Capacity: 407 ug/dL (ref 250–450)
UIBC: 367 ug/dL (ref 131–425)

## 2022-02-11 NOTE — Progress Notes (Signed)
Let patient know that her blood cell count has significantly improved compared to when she left the hospital in July.  Iron studies are consistent with iron deficiency.  I recommend taking the iron supplement daily for 1 month then stop.

## 2022-02-13 ENCOUNTER — Ambulatory Visit (INDEPENDENT_AMBULATORY_CARE_PROVIDER_SITE_OTHER): Payer: 59 | Admitting: Orthopaedic Surgery

## 2022-02-13 ENCOUNTER — Encounter: Payer: Self-pay | Admitting: Orthopaedic Surgery

## 2022-02-13 DIAGNOSIS — G8929 Other chronic pain: Secondary | ICD-10-CM

## 2022-02-13 DIAGNOSIS — M25561 Pain in right knee: Secondary | ICD-10-CM | POA: Diagnosis not present

## 2022-02-13 DIAGNOSIS — Z96641 Presence of right artificial hip joint: Secondary | ICD-10-CM

## 2022-02-13 NOTE — Progress Notes (Signed)
The patient is now just over 3 months status post a right total hip arthroplasty.  She does still report some groin pain but said her hip feels much better than what it did preoperative.  When she has been dealing with more of an issue is her right knee.  On my last exam her patella seems to track more medial and she has been through physical therapy now helping with the knee.  She said the knee is still bothersome to her.  She is never had a steroid injection in that right knee and is requesting this today.  On exam her right knee shows no effusion but again the patella does seem to track medial.  I did feel that it was appropriate to try a steroid injection in her right knee today.  We will see her back in 4 weeks for repeat exam of that knee and see how she is doing overall.  We have already x-rayed the knee so my next step would be potentially getting an MRI of the right knee but we will see how she is doing at her next visit.  She agrees with this plan.  All questions and concerns were answered and addressed.

## 2022-03-13 ENCOUNTER — Other Ambulatory Visit: Payer: Self-pay

## 2022-03-13 ENCOUNTER — Ambulatory Visit (INDEPENDENT_AMBULATORY_CARE_PROVIDER_SITE_OTHER): Payer: 59 | Admitting: Orthopaedic Surgery

## 2022-03-13 ENCOUNTER — Encounter: Payer: Self-pay | Admitting: Orthopaedic Surgery

## 2022-03-13 DIAGNOSIS — M25561 Pain in right knee: Secondary | ICD-10-CM

## 2022-03-13 DIAGNOSIS — G8929 Other chronic pain: Secondary | ICD-10-CM

## 2022-03-13 DIAGNOSIS — Z96641 Presence of right artificial hip joint: Secondary | ICD-10-CM

## 2022-03-13 MED ORDER — IBUPROFEN 800 MG PO TABS: 800.0000 mg | ORAL_TABLET | Freq: Three times a day (TID) | ORAL | 3 refills | Status: DC | PRN

## 2022-03-13 NOTE — Progress Notes (Signed)
The patient is status post right total hip arthroplasty that was done in July of this year.  She said the hip is still somewhat painful and stiff but is getting better.  It is her right knee is been bothering her the most.  On exam her patella seems to track slightly medial and is hypermobile.  There is no knee joint effusion and x-rays do not show any worrisome features of the knee at the last visit.  She has good range of motion of the knee but that is what bothers her the most.  I would like to send her to outpatient physical therapy to actually strengthen the muscles around her right knee and any modalities per the therapist discretion for her knee.  I am fine with them working on her right hip and IT band.  We will try knee brace as well.  She agrees with treatment plan.  She did request 100 mg of ibuprofen which is sent to her pharmacy.  We will see her back in 6 weeks to see how she is doing from a therapy standpoint.

## 2022-03-16 ENCOUNTER — Other Ambulatory Visit: Payer: Self-pay | Admitting: Physician Assistant

## 2022-03-21 ENCOUNTER — Ambulatory Visit: Payer: 59 | Attending: Internal Medicine | Admitting: Pharmacist

## 2022-03-21 VITALS — BP 138/89 | HR 75

## 2022-03-21 DIAGNOSIS — I10 Essential (primary) hypertension: Secondary | ICD-10-CM

## 2022-03-21 MED ORDER — AMLODIPINE BESYLATE 5 MG PO TABS: 5.0000 mg | ORAL_TABLET | Freq: Every day | ORAL | 1 refills | Status: DC

## 2022-03-21 NOTE — Progress Notes (Signed)
S:     No chief complaint on file.  56 y.o. female who presents for hypertension evaluation, education, and management.  PMH is significant for migraines, COPD, Graves disease S/p ablation now on thyroid medications, GAD, and tobacco use. Patient was referred and last seen by Primary Care Provider, Dr. Wynetta Emery, on 02/10/2022.   At last visit, her BP was elevated without a formal dx of HTN. She was asked to return for a BP check with me today.    Today, patient arrives in good spirits and presents without assistance. Denies dizziness, headache, blurred vision, swelling.   Patient reports hypertension has not been formally diagnosed.   Family/Social history:  Fhx: HTN, MI, allergies Tobacco: some day smoker (0.5 PPD) Alcohol: none reported  Medication adherence: she does not currently take antihypertensive medications.  Current antihypertensives include: none  Antihypertensives tried in the past include: none. NKDA  Reported home BP readings:  -Uses a wrist cuff.  -BP this morning was 111/84 mmHg.   Patient reported dietary habits: -Patient has a family member with CKD. She limits sodium and uses salt-substitutes such as Mrs. Dash when cooking.  -No excessive caffeine intake reported   Patient-reported exercise habits:  -Limited. Pt had recent hip replacement surgery.  -She is in PT currently for R knee pain as well.   O:  Vitals:   03/21/22 0902  BP: 138/89  Pulse: 75     Last 3 Office BP readings: BP Readings from Last 3 Encounters:  03/21/22 138/89  02/10/22 (!) 151/90  01/12/22 122/80   BMET    Component Value Date/Time   NA 137 11/05/2021 0338   NA 144 12/07/2017 1021   K 4.0 11/05/2021 0338   CL 104 11/05/2021 0338   CO2 25 11/05/2021 0338   GLUCOSE 142 (H) 11/05/2021 0338   BUN 20 11/05/2021 0338   BUN 14 12/07/2017 1021   CREATININE 0.64 11/05/2021 0338   CREATININE 0.51 07/31/2014 0947   CALCIUM 8.5 (L) 11/05/2021 0338   GFRNONAA >60  11/05/2021 0338   GFRNONAA >89 07/31/2014 0947   GFRAA >60 03/09/2019 1148   GFRAA >89 07/31/2014 0947    Renal function: CrCl cannot be calculated (Patient's most recent lab result is older than the maximum 21 days allowed.).  Clinical ASCVD: No  The 10-year ASCVD risk score (Arnett DK, et al., 2019) is: 10.6%   Values used to calculate the score:     Age: 63 years     Sex: Female     Is Non-Hispanic African American: Yes     Diabetic: No     Tobacco smoker: Yes     Systolic Blood Pressure: 295 mmHg     Is BP treated: Yes     HDL Cholesterol: 65 mg/dL     Total Cholesterol: 159 mg/dL  A/P: Hypertension: BP today is better but still elevated. She now has two separate clinic readings meeting criteria for hypertension. Her ASCVD risk is ~10%. She has family hx of clinical ASCVD. Will start amlodipine today. BP goal < 130/80 mmHg. -Started amlodipine 5 mg daily.  -Patient educated on purpose, proper use, and potential adverse effects of amlodipine.  -F/u labs ordered - none today. -Counseled on lifestyle modifications for blood pressure control including reduced dietary sodium, increased exercise, adequate sleep. -Encouraged patient to check BP at home and bring log of readings to next visit. Counseled on proper use of home BP cuff.    Results reviewed and written information provided.  Written patient instructions provided. Patient verbalized understanding of treatment plan.  Total time in face to face counseling 30 minutes.    Follow-up:  Pharmacist in 1 month.  Benard Halsted, PharmD, [facility] [place], [place] [phone]

## 2022-03-27 ENCOUNTER — Other Ambulatory Visit: Payer: Self-pay | Admitting: Pharmacist

## 2022-03-27 MED ORDER — AMLODIPINE BESYLATE 5 MG PO TABS: 5.0000 mg | ORAL_TABLET | Freq: Every day | ORAL | 1 refills | Status: DC

## 2022-03-27 NOTE — Therapy (Incomplete)
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[place])    Grave's disease    Graves Disease   History of radioactive iodine thyroid ablation    02/21/2019   Menometrorrhagia    Migraines    Pneumonia    Past Surgical History:  Procedure Laterality Date   ABDOMINAL HYSTERECTOMY     TOTAL HIP ARTHROPLASTY Right 11/04/2021   Procedure: RIGHT TOTAL HIP ARTHROPLASTY ANTERIOR APPROACH;  Surgeon: Mcarthur Rossetti, MD;  Location: [facility];  Service: Orthopedics;  Laterality: Right;   TUBAL LIGATION     Patient Active Problem List   Diagnosis Date Noted   Absolute anemia 02/11/2022   Status post total replacement of right hip 11/04/2021   Syphilis 09/08/2021   Hand, foot and mouth disease 09/08/2021   Unilateral primary osteoarthritis, right hip 08/31/2021   Dysphagia 07/01/2020   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   Graves disease 04/10/2019   Prediabetes 10/18/2018   Tinea pedis 07/31/2014   Screening for HIV (human immunodeficiency virus) 07/31/2014   Cramp of limb 01/13/2011   Sarcoidosis of lung ([place]) 12/30/2010   Rhinitis, allergic 11/29/2010   Urticaria, idiopathic 10/27/2010   COPD, mild ([place]) 08/06/2010   Graves' disease 06/03/2010   MIGRAINE HEADACHE 11/12/2008   ROTATOR CUFF TEAR 11/12/2008   DEPRESSION, RECURRENT 05/12/2008   ANXIETY DISORDER, GENERALIZED 02/05/2007   TOBACCO ABUSE 02/05/2007    PCP: Ladell Pier, MD  REFERRING PROVIDER: Mcarthur Rossetti, MD  REFERRING DIAG:  Diagnosis  M25.561,G89.29 (ICD-10-CM) - Chronic pain of right knee    THERAPY DIAG:  No diagnosis found.  Rationale for Evaluation and Treatment: Rehabilitation  ONSET DATE: ***  SUBJECTIVE:   SUBJECTIVE  STATEMENT: ***  PERTINENT HISTORY: OA, Graves' disease, migranes, R THA 2023, hypothyroid, pre-diabetes, COPD, RTC tear PAIN:  Are you having pain? Yes: NPRS scale: ***/10 Pain location: *** Pain description: *** Aggravating factors: *** Relieving factors: ***  PRECAUTIONS: {Therapy precautions:24002}  WEIGHT BEARING RESTRICTIONS: {Yes ***/No:24003}  FALLS:  Has patient fallen in last 6 months? {fallsyesno:27318}  LIVING ENVIRONMENT: Lives with: {OPRC lives with:25569::"lives with their family"} Lives in: {Lives in:25570} Stairs: {opstairs:27293} Has following equipment at home: {Assistive devices:23999}  OCCUPATION: ***  PLOF: {PLOF:24004}  PATIENT GOALS: ***  NEXT MD VISIT:   OBJECTIVE:   DIAGNOSTIC FINDINGS: 2 views of the right knee show no acute findings.  The patella on the AP  view standing seems to track medial.  There is no effusion.  The medial  lateral compartments are well-maintained  PATIENT SURVEYS:  FOTO ***  COGNITION: Overall cognitive status: {cognition:24006}     SENSATION: {sensation:27233}  EDEMA:  {edema:24020}  MUSCLE LENGTH: Hamstrings: Right *** deg; Left *** deg Marcello Moores test: Right *** deg; Left *** deg  POSTURE: {posture:25561}  PALPATION: ***  LOWER EXTREMITY ROM:  Active ROM Right eval Left eval  Hip flexion    Hip extension    Hip abduction    Hip adduction    Hip internal rotation    Hip external rotation    Knee flexion    Knee extension    Ankle dorsiflexion    Ankle plantarflexion    Ankle inversion    Ankle eversion     (Blank rows =  not tested)  LOWER EXTREMITY MMT:  MMT Right eval Left eval  Hip flexion    Hip extension    Hip abduction    Hip adduction    Hip internal rotation    Hip external rotation    Knee flexion    Knee extension    Ankle dorsiflexion    Ankle plantarflexion    Ankle inversion    Ankle eversion     (Blank rows = not tested)  LOWER EXTREMITY SPECIAL TESTS:   {LEspecialtests:26242}  FUNCTIONAL TESTS:  {Functional tests:24029}  GAIT: Distance walked: *** Assistive device utilized: {Assistive devices:23999} Level of assistance: {Levels of assistance:24026} Comments: ***   TODAY'S TREATMENT:                                                                                                                              DATE: 03/28/2022    PATIENT EDUCATION:  Education details: *** Person educated: {Person educated:25204} Education method: {Education Method:25205} Education comprehension: {Education Comprehension:25206}  HOME EXERCISE PROGRAM: ***  ASSESSMENT:  CLINICAL IMPRESSION: Patient is a 56 y.o. female who was seen today for physical therapy evaluation and treatment for chronic Right knee pain.   OBJECTIVE IMPAIRMENTS: {opptimpairments:25111}.   ACTIVITY LIMITATIONS: {activitylimitations:27494}  PARTICIPATION LIMITATIONS: {participationrestrictions:25113}  PERSONAL FACTORS: OA, Graves' disease, migranes, R THA 2023, hypothyroid, pre-diabetes, COPD, RTC tear are also affecting patient's functional outcome.   REHAB POTENTIAL: {rehabpotential:25112}  CLINICAL DECISION MAKING: {clinical decision making:25114}  EVALUATION COMPLEXITY: {Evaluation complexity:25115}   GOALS: Goals reviewed with patient? Yes  SHORT TERM GOALS: Target date: *** *** Baseline: Goal status: {GOALSTATUS:25110}  2.  *** Baseline:  Goal status: {GOALSTATUS:25110}  3.  *** Baseline:  Goal status: {GOALSTATUS:25110}  4.  *** Baseline:  Goal status: {GOALSTATUS:25110}  5.  *** Baseline:  Goal status: {GOALSTATUS:25110}  6.  *** Baseline:  Goal status: {GOALSTATUS:25110}  LONG TERM GOALS: Target date: ***  *** Baseline:  Goal status: {GOALSTATUS:25110}  2.  *** Baseline:  Goal status: {GOALSTATUS:25110}  3.  *** Baseline:  Goal status: {GOALSTATUS:25110}  4.  *** Baseline:  Goal status: {GOALSTATUS:25110}  5.   *** Baseline:  Goal status: {GOALSTATUS:25110}  6.  *** Baseline:  Goal status: {GOALSTATUS:25110}   PLAN:  PT FREQUENCY: {rehab frequency:25116}  PT DURATION: {rehab duration:25117}  PLANNED INTERVENTIONS: {rehab planned interventions:25118::"Therapeutic exercises","Therapeutic activity","Neuromuscular re-education","Balance training","Gait training","Patient/Family education","Self Care","Joint mobilization"}  PLAN FOR NEXT SESSION: Farley Ly, PT, MPT 03/27/2022, 2:35 PM

## 2022-03-28 ENCOUNTER — Ambulatory Visit: Payer: 59 | Admitting: Rehabilitative and Restorative Service Providers"

## 2022-04-04 ENCOUNTER — Other Ambulatory Visit: Payer: Self-pay | Admitting: Orthopaedic Surgery

## 2022-04-26 ENCOUNTER — Ambulatory Visit (INDEPENDENT_AMBULATORY_CARE_PROVIDER_SITE_OTHER): Payer: 59 | Admitting: Orthopaedic Surgery

## 2022-04-26 ENCOUNTER — Encounter: Payer: Self-pay | Admitting: Orthopaedic Surgery

## 2022-04-26 DIAGNOSIS — G8929 Other chronic pain: Secondary | ICD-10-CM

## 2022-04-26 DIAGNOSIS — M25561 Pain in right knee: Secondary | ICD-10-CM

## 2022-04-26 DIAGNOSIS — Z96641 Presence of right artificial hip joint: Secondary | ICD-10-CM

## 2022-04-26 NOTE — Addendum Note (Signed)
Addended by: Elvin So L on: 04/26/2022 11:10 AM   Modules accepted: Orders

## 2022-04-26 NOTE — Progress Notes (Signed)
The patient is now 5 months status post a right total hip arthroplasty.  I been following her more for her right knee.  Her patella is ill seems to track medially and there is a lot of pain around the patella.  There is some slight instability of the knee on exam ligamentously.  Her right hip is moving smoothly.  She has been developing some left hip and groin pain but that is been only mild.  Both hips move smoothly today with a little bit of pain on the right hip and left hip in the groin area but just mild.  Her right knee still has a patella that tracks more medially and an effusion.  The knee feels ligamentously lax with that right knee.  At this point she has tried and failed conservative treatment for well over 6 weeks including quad strengthening exercises.  A MRI of the right knee to assess the ligaments and the patella retinacular ligaments is also warranted.  Will see her back with the results of that MRI.

## 2022-05-03 NOTE — Progress Notes (Unsigned)
S:     PCP: Dr. Wynetta Emery  57 y.o. female who presents for hypertension evaluation, education, and management. PMH is significant for migraines, COPD, Graves disease S/p ablation now on thyroid medications, GAD, and tobacco use. Patient was referred and last seen by Primary Care Provider, Dr. Wynetta Emery, on 02/10/2022. At that visit, her BP was elevated without a formal dx of HTN.     She was then seen by the clinical pharmacist on 03/21/22 to recheck her BP. BP was elevated at 138/89 at that time and she was started on amlodipine '5mg'$  once daily.    Today, patient arrives in good spirits and presents without assistance.  Denies dizziness, headache, blurred vision, swelling. She reported frequent headaches prior to initiation of her amlodipine that has since resolved.  Hypertension was diagnosed in 03/2022.  Family/Social history:  Fhx: HTN, MI, allergies Tobacco: some day smoker (0.5 PPD) Alcohol: none reported  Medication adherence appropriate. Patient has taken BP medications today.   Current antihypertensives include: amlodipine '5mg'$  once daily   Antihypertensives tried in the past include: none  Reported home BP readings: not checking   Patient reported dietary habits:  -Patient has a family member with CKD. She limits sodium and uses salt-substitutes such as Mrs. Dash when cooking.  -No excessive caffeine intake reported   Patient-reported exercise habits:  -Limited. Pt had recent hip replacement surgery.  -She is in PT currently for R knee pain as well.  -28 day pilates program    O:  Vitals:   05/04/22 0852  BP: 126/82  Pulse: 77    Last 3 Office BP readings: BP Readings from Last 3 Encounters:  03/21/22 138/89  02/10/22 (!) 151/90  01/12/22 122/80    BMET    Component Value Date/Time   NA 137 11/05/2021 0338   NA 144 12/07/2017 1021   K 4.0 11/05/2021 0338   CL 104 11/05/2021 0338   CO2 25 11/05/2021 0338   GLUCOSE 142 (H) 11/05/2021 0338   BUN 20  11/05/2021 0338   BUN 14 12/07/2017 1021   CREATININE 0.64 11/05/2021 0338   CREATININE 0.51 07/31/2014 0947   CALCIUM 8.5 (L) 11/05/2021 0338   GFRNONAA >60 11/05/2021 0338   GFRNONAA >89 07/31/2014 0947   GFRAA >60 03/09/2019 1148   GFRAA >89 07/31/2014 0947    Renal function: CrCl cannot be calculated (Patient's most recent lab result is older than the maximum 21 days allowed.).  Clinical ASCVD: No  The 10-year ASCVD risk score (Arnett DK, et al., 2019) is: 10.6%   Values used to calculate the score:     Age: 59 years     Sex: Female     Is Non-Hispanic African American: Yes     Diabetic: No     Tobacco smoker: Yes     Systolic Blood Pressure: 696 mmHg     Is BP treated: Yes     HDL Cholesterol: 65 mg/dL     Total Cholesterol: 159 mg/dL   A/P: Hypertension diagnosed currently controlled on current medications. BP goal < 130/80 mmHg. Medication adherence appears optimal.  -Continued amlodipine '5mg'$  once daily.  -Patient educated on purpose, proper use, and potential adverse effects of amlodipine.  -Counseled on lifestyle modifications for blood pressure control including reduced dietary sodium, increased exercise, adequate sleep. -Encouraged patient to check BP at home and bring log of readings to next visit. Counseled on proper use of home BP cuff.   Results reviewed and written information provided.  Written patient instructions provided. Patient verbalized understanding of treatment plan.  Total time in face to face counseling 10 minutes.    Follow-up:  PCP clinic visit in 06/13/2022  Maryan Puls, PharmD PGY-1 [facility] Resident

## 2022-05-04 ENCOUNTER — Ambulatory Visit: Payer: 59 | Attending: Nurse Practitioner | Admitting: Pharmacist

## 2022-05-04 VITALS — BP 126/82 | HR 77

## 2022-05-04 DIAGNOSIS — I10 Essential (primary) hypertension: Secondary | ICD-10-CM

## 2022-05-12 ENCOUNTER — Ambulatory Visit
Admission: RE | Admit: 2022-05-12 | Discharge: 2022-05-12 | Disposition: A | Discharge status: Home / Self Care | Payer: No Typology Code available for payment source | Source: Ambulatory Visit | Attending: Orthopaedic Surgery | Admitting: Orthopaedic Surgery

## 2022-05-12 DIAGNOSIS — G8929 Other chronic pain: Secondary | ICD-10-CM

## 2022-05-29 ENCOUNTER — Encounter: Payer: Self-pay | Admitting: Orthopaedic Surgery

## 2022-05-29 ENCOUNTER — Ambulatory Visit (INDEPENDENT_AMBULATORY_CARE_PROVIDER_SITE_OTHER): Payer: No Typology Code available for payment source | Admitting: Orthopaedic Surgery

## 2022-05-29 DIAGNOSIS — M25561 Pain in right knee: Secondary | ICD-10-CM

## 2022-05-29 DIAGNOSIS — Z96641 Presence of right artificial hip joint: Secondary | ICD-10-CM | POA: Diagnosis not present

## 2022-05-29 DIAGNOSIS — G8929 Other chronic pain: Secondary | ICD-10-CM | POA: Diagnosis not present

## 2022-05-29 NOTE — Progress Notes (Signed)
The patient comes in today to go over an MRI of her right knee.  We replaced her right hip in July of last year and she has been having some problems with the right knee aching but also what appears to be potentially medial tracking of the patella.  She is a thin individual.  She denies any locking catching at all.  She has been through physical therapy as well.  The MRIs reviewed with her of her right knee.  There is some mild thinning of the cartilage throughout the knee and a small oblique tear of the medial meniscus but again this does not correlate with anything clinically.  The medial patellofemoral retinacular ligament is intact and the extensor mechanism is also intact.  On exam again there is no effusion.  She almost has a slight valgus position of the right knee when she stands.  Both patellas track slightly medially.  At this point I would not recommend anything in terms of surgery for her right knee given that she has no mechanical symptoms at all.  She also agrees with this treatment plan.  She will continue work on quad strengthening exercises.  From my standpoint, the next time I need to see her is at the 1 year follow-up for her right hip replacement.  That will be a standing AP pelvis and lateral of her right hip at that visit.

## 2022-06-13 ENCOUNTER — Encounter: Payer: Self-pay | Admitting: Internal Medicine

## 2022-06-13 ENCOUNTER — Ambulatory Visit: Payer: No Typology Code available for payment source | Attending: Internal Medicine | Admitting: Internal Medicine

## 2022-06-13 VITALS — BP 110/77 | HR 81 | Temp 98.3°F | Ht 68.0 in | Wt 153.0 lb

## 2022-06-13 DIAGNOSIS — R7303 Prediabetes: Secondary | ICD-10-CM | POA: Diagnosis not present

## 2022-06-13 DIAGNOSIS — Z1231 Encounter for screening mammogram for malignant neoplasm of breast: Secondary | ICD-10-CM | POA: Diagnosis not present

## 2022-06-13 DIAGNOSIS — I10 Essential (primary) hypertension: Secondary | ICD-10-CM

## 2022-06-13 DIAGNOSIS — F17200 Nicotine dependence, unspecified, uncomplicated: Secondary | ICD-10-CM

## 2022-06-13 DIAGNOSIS — N3946 Mixed incontinence: Secondary | ICD-10-CM | POA: Diagnosis not present

## 2022-06-13 DIAGNOSIS — J438 Other emphysema: Secondary | ICD-10-CM | POA: Diagnosis not present

## 2022-06-13 DIAGNOSIS — E611 Iron deficiency: Secondary | ICD-10-CM

## 2022-06-13 DIAGNOSIS — N951 Menopausal and female climacteric states: Secondary | ICD-10-CM | POA: Diagnosis not present

## 2022-06-13 MED ORDER — SOLIFENACIN SUCCINATE 5 MG PO TABS: 5.0000 mg | ORAL_TABLET | Freq: Every day | ORAL | 3 refills | Status: DC

## 2022-06-13 MED ORDER — NICOTINE 14 MG/24HR TD PT24: 14.0000 mg | MEDICATED_PATCH | Freq: Every day | TRANSDERMAL | 1 refills | Status: DC

## 2022-06-13 NOTE — Patient Instructions (Addendum)
We have started you on a medication called Vesicare help with the incontinence.    Please stop the medication if it causes you not to be able to urinate at all.  I have resubmitted the referral for your mammogram.  Once you have had this done we can start you on hormone replacement therapy if there is a delay in getting an appointment with the gynecologist.
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
## 2022-06-14 LAB — URINALYSIS, ROUTINE W REFLEX MICROSCOPIC
Bilirubin, UA: NEGATIVE
Glucose, UA: NEGATIVE
Ketones, UA: NEGATIVE
Leukocytes,UA: NEGATIVE
Nitrite, UA: NEGATIVE
RBC, UA: NEGATIVE
Specific Gravity, UA: >=1.03 — AB (ref 1.005–1.030)
Urobilinogen, Ur: 0.2 mg/dL (ref 0.2–1.0)
pH, UA: 5.5 (ref 5.0–7.5)

## 2022-06-14 LAB — HEMOGLOBIN A1C
Est. average glucose Bld gHb Est-mCnc: 120 mg/dL
Hgb A1c MFr Bld: 5.8 % — ABNORMAL HIGH (ref 4.8–5.6)

## 2022-06-14 LAB — CBC
Hematocrit: 38.2 % (ref 34.0–46.6)
Hemoglobin: 12.7 g/dL (ref 11.1–15.9)
MCH: 28.5 pg (ref 26.6–33.0)
MCHC: 33.2 g/dL (ref 31.5–35.7)
MCV: 86 fL (ref 79–97)
Platelets: 341 10*3/uL (ref 150–450)
RBC: 4.45 x10E6/uL (ref 3.77–5.28)
RDW: 13.2 % (ref 11.7–15.4)
WBC: 4.3 10*3/uL (ref 3.4–10.8)

## 2022-06-14 LAB — IRON,TIBC AND FERRITIN PANEL
Ferritin: 53 ng/mL (ref 15–150)
Iron Saturation: 12 % — ABNORMAL LOW (ref 15–55)
Iron: 45 ug/dL (ref 27–159)
Total Iron Binding Capacity: 363 ug/dL (ref 250–450)
UIBC: 318 ug/dL (ref 131–425)

## 2022-06-24 ENCOUNTER — Other Ambulatory Visit: Payer: Self-pay | Admitting: Internal Medicine

## 2022-06-24 DIAGNOSIS — D508 Other iron deficiency anemias: Secondary | ICD-10-CM

## 2022-06-26 NOTE — Telephone Encounter (Signed)
Requested Prescriptions  Pending Prescriptions Disp Refills   ferrous sulfate 325 (65 FE) MG tablet [Pharmacy Med Name: FERROUS SULFATE 325 MG TABLET] 30 tablet 5    Sig: TAKE 1 TABLET BY MOUTH EVERY DAY WITH BREAKFAST     Endocrinology:  Minerals - Iron Supplementation Passed - 06/24/2022  8:37 AM      Passed - HGB in normal range and within 360 days    Hemoglobin  Date Value Ref Range Status  06/13/2022 12.7 11.1 - 15.9 g/dL Final         Passed - HCT in normal range and within 360 days    Hematocrit  Date Value Ref Range Status  06/13/2022 38.2 34.0 - 46.6 % Final         Passed - RBC in normal range and within 360 days    RBC  Date Value Ref Range Status  06/13/2022 4.45 3.77 - 5.28 x10E6/uL Final  11/07/2021 2.85 (L) 3.87 - 5.11 MIL/uL Final         Passed - Fe (serum) in normal range and within 360 days    Iron  Date Value Ref Range Status  06/13/2022 45 27 - 159 ug/dL Final   %SAT  Date Value Ref Range Status  02/25/2020 11 (L) 16 - 45 % (calc) Final   Iron Saturation  Date Value Ref Range Status  06/13/2022 12 (L) 15 - 55 % Final         Passed - Ferritin in normal range and within 360 days    Ferritin  Date Value Ref Range Status  06/13/2022 53 15 - 150 ng/mL Final         Passed - Valid encounter within last 12 months    Recent Outpatient Visits           1 week ago Urinary incontinence, mixed   [place], Deborah B, MD   1 month ago Essential hypertension   [place], Jarome Matin, RPH-CPP   3 months ago Essential hypertension   [place], [place] L, RPH-CPP   4 months ago Acute frontal sinusitis, recurrence not specified   [place], MD   10 months ago Desquamative dermatitis   [place], Dionne Bucy, [place]        Future Appointments             In 5 months Ninfa Linden, Lind Guest, MD [place]

## 2022-07-06 ENCOUNTER — Encounter: Payer: Self-pay | Admitting: Radiology

## 2022-07-11 ENCOUNTER — Other Ambulatory Visit: Payer: Self-pay

## 2022-07-11 ENCOUNTER — Telehealth: Payer: Self-pay | Admitting: Orthopaedic Surgery

## 2022-07-11 NOTE — Telephone Encounter (Signed)
Patient asking to get refill on her ibuprofen '800mg'$  please advise

## 2022-07-11 NOTE — Telephone Encounter (Signed)
Spoke with patient and told her she can't take both the meloxicam and Ibuprofen  She states she wasn't aware of that

## 2022-07-12 ENCOUNTER — Ambulatory Visit: Payer: 59 | Admitting: Radiology

## 2022-07-22 ENCOUNTER — Other Ambulatory Visit: Payer: Self-pay | Admitting: Orthopaedic Surgery

## 2022-07-22 ENCOUNTER — Other Ambulatory Visit: Payer: Self-pay | Admitting: Internal Medicine

## 2022-07-22 DIAGNOSIS — F17200 Nicotine dependence, unspecified, uncomplicated: Secondary | ICD-10-CM

## 2022-07-24 NOTE — Telephone Encounter (Signed)
Requested medication (s) are due for refill today - unsure  Requested medication (s) are on the active medication list -yes  Future visit scheduled -no  Last refill: Varenicline- Rx for started pack on file- 02/10/22 #53- unsure if needed- or is continuation Rx needed                  Nicotine- 06/13/22 #28 1RF- too soon  Notes to clinic: Attempted to call patient to verify new start vs continuation Rx- left message to call office to clarify.  Requested Prescriptions  Pending Prescriptions Disp Refills   Varenicline Tartrate, Starter, 0.5 MG X 11 & 1 MG X 42 TBPK [Pharmacy Med Name: VARENICLINE STARTING MONTH BOX] 53 each 0    Sig: USE AS DIRECTED     Psychiatry:  Drug Dependence Therapy - varenicline Failed - 07/22/2022  4:23 PM      Failed - Manual Review: Do not refill starter pack. 1 mg tabs may be extended up to one year if the patient has quit smoking but still feels at risk for relapse.      Failed - Cr in normal range and within 180 days    Creat  Date Value Ref Range Status  07/31/2014 0.51 0.50 - 1.10 mg/dL Final   Creatinine, Ser  Date Value Ref Range Status  11/05/2021 0.64 0.44 - 1.00 mg/dL Final         Passed - Completed PHQ-2 or PHQ-9 in the last 360 days      Passed - Valid encounter within last 6 months    Recent Outpatient Visits           1 month ago Urinary incontinence, mixed   [place], MD   2 months ago Essential hypertension   [place], Jarome Matin, RPH-CPP   4 months ago Essential hypertension   [place], [place] L, RPH-CPP   5 months ago Acute frontal sinusitis, recurrence not specified   [place], MD   11 months ago Desquamative dermatitis   [place], [place]       Future  Appointments             In 4 months Ninfa Linden, Lind Guest, MD [facility]             nicotine (NICODERM CQ - DOSED IN MG/24 HOURS) 14 mg/24hr patch [Pharmacy Med Name: NICOTINE 14 MG/24HR PATCH] 28 patch 1    Sig: PLACE 1 [place].     Psychiatry:  Drug Dependence Therapy Passed - 07/22/2022  4:23 PM      Passed - Valid encounter within last 12 months    Recent Outpatient Visits           1 month ago Urinary incontinence, mixed   [place], MD   2 months ago Essential hypertension   [place], Jarome Matin, RPH-CPP   4 months ago Essential hypertension   [place], Jarome Matin, RPH-CPP   5 months ago Acute frontal sinusitis, recurrence not specified   [place], MD   11 months ago Desquamative  [place], Dionne Bucy, [place]       Future Appointments             In 4 months Ninfa Linden, Lind Guest, MD [facility]               Requested Prescriptions  Pending Prescriptions Disp Refills   Varenicline Tartrate, Starter, 0.5 MG X 11 & 1 MG X 42 TBPK [Pharmacy Med Name: VARENICLINE STARTING MONTH BOX] 53 each 0    Sig: USE AS DIRECTED     Psychiatry:  Drug Dependence Therapy - varenicline Failed - 07/22/2022  4:23 PM      Failed - Manual Review: Do not refill starter pack. 1 mg tabs may be extended up to one year if the patient has quit smoking but still feels at risk for relapse.      Failed - Cr in normal range and within 180 days    Creat  Date Value Ref Range Status  07/31/2014 0.51 0.50 - 1.10 mg/dL Final   Creatinine, Ser  Date Value Ref Range Status  11/05/2021 0.64 0.44 - 1.00 mg/dL Final         Passed - Completed PHQ-2 or PHQ-9 in the  last 360 days      Passed - Valid encounter within last 6 months    Recent Outpatient Visits           1 month ago Urinary incontinence, mixed   [place], MD   2 months ago Essential hypertension   [place], Jarome Matin, RPH-CPP   4 months ago Essential hypertension   [place], [place] L, RPH-CPP   5 months ago Acute frontal sinusitis, recurrence not specified   [place], MD   11 months ago Desquamative dermatitis   [place], [place]       Future Appointments             In 4 months Ninfa Linden, Lind Guest, MD [facility]             nicotine (NICODERM CQ - DOSED IN MG/24 HOURS) 14 mg/24hr patch [Pharmacy Med Name: NICOTINE 14 MG/24HR PATCH] 28 patch 1    Sig: PLACE 1 [place].     Psychiatry:  Drug Dependence Therapy Passed - 07/22/2022  4:23 PM      Passed - Valid encounter within last 12 months    Recent Outpatient Visits           1 month ago Urinary incontinence, mixed   [place], MD   2 months ago Essential hypertension   [place], Jarome Matin, RPH-CPP   4 months ago Essential hypertension   [place], Stephen L, RPH-CPP   5 months ago Acute frontal sinusitis, recurrence not specified   [place], MD   11 months ago Desquamative dermatitis   [place], Dionne Bucy, [place]       Future Appointments  In 4 months Mcarthur Rossetti, MD [place]

## 2022-08-04 ENCOUNTER — Ambulatory Visit
Admission: RE | Admit: 2022-08-04 | Discharge: 2022-08-04 | Disposition: A | Discharge status: Home / Self Care | Payer: 59 | Source: Ambulatory Visit | Attending: Internal Medicine | Admitting: Internal Medicine

## 2022-08-04 DIAGNOSIS — Z1231 Encounter for screening mammogram for malignant neoplasm of breast: Secondary | ICD-10-CM | POA: Diagnosis not present

## 2022-09-15 ENCOUNTER — Other Ambulatory Visit: Payer: Self-pay | Admitting: Internal Medicine

## 2022-09-15 NOTE — Telephone Encounter (Signed)
Requested Prescriptions  Pending Prescriptions Disp Refills   amLODipine (NORVASC) 5 MG tablet [Pharmacy Med Name: AMLODIPINE BESYLATE 5 MG TAB] 90 tablet 0    Sig: TAKE 1 TABLET (5 MG TOTAL) BY MOUTH DAILY.     Cardiovascular: Calcium Channel Blockers 2 Passed - 09/15/2022  2:16 AM      Passed - Last BP in normal range    BP Readings from Last 1 Encounters:  06/13/22 110/77         Passed - Last Heart Rate in normal range    Pulse Readings from Last 1 Encounters:  06/13/22 81         Passed - Valid encounter within last 6 months    Recent Outpatient Visits           3 months ago Urinary incontinence, mixed   [place] [facility] Marcine Matar, MD   4 months ago Essential hypertension   [facility] [place], Cornelius Moras, RPH-CPP   5 months ago Essential hypertension   [facility] [place], [place] L, RPH-CPP   7 months ago Acute frontal sinusitis, recurrence not specified   [place] [facility] Marcine Matar, MD   1 year ago Desquamative dermatitis   [place] [facility] [place], Marzella Schlein, [place]       Future Appointments             In 2 months [place] Ivan, Vanita Panda, MD [facility]

## 2022-10-03 ENCOUNTER — Other Ambulatory Visit: Payer: Self-pay | Admitting: Internal Medicine

## 2022-10-03 DIAGNOSIS — N3946 Mixed incontinence: Secondary | ICD-10-CM

## 2022-10-03 NOTE — Telephone Encounter (Signed)
Requested medications are due for refill today.  yes  Requested medications are on the active medications list.  yes  Last refill. 06/13/2022 #30 2 rf  Future visit scheduled.   no  Notes to clinic.  Labs are expired.    Requested Prescriptions  Pending Prescriptions Disp Refills   solifenacin (VESICARE) 5 MG tablet [Pharmacy Med Name: SOLIFENACIN 5 MG TABLET] 30 tablet 3    Sig: Take 1 tablet (5 mg total) by mouth daily.     Urology:  Bladder Agents 2 Failed - 10/03/2022  2:15 AM      Failed - ALT in normal range and within 360 days    ALT  Date Value Ref Range Status  08/11/2021 8 0 - 32 IU/L Final         Failed - AST in normal range and within 360 days    AST  Date Value Ref Range Status  08/11/2021 11 0 - 40 IU/L Final         Passed - Cr in normal range and within 360 days    Creat  Date Value Ref Range Status  07/31/2014 0.51 0.50 - 1.10 mg/dL Final   Creatinine, Ser  Date Value Ref Range Status  11/05/2021 0.64 0.44 - 1.00 mg/dL Final         Passed - Valid encounter within last 12 months    Recent Outpatient Visits           3 months ago Urinary incontinence, mixed   [place] [facility] Marcine Matar, MD   5 months ago Essential hypertension   [facility] [place], Cornelius Moras, RPH-CPP   6 months ago Essential hypertension   [facility] [place], [place] L, RPH-CPP   7 months ago Acute frontal sinusitis, recurrence not specified   [place] [facility] Marcine Matar, MD   1 year ago Desquamative dermatitis    [facility] [place], Marzella Schlein, [place]       Future Appointments             In 1 month Magnus Ivan, Vanita Panda, MD [facility]

## 2022-10-11 ENCOUNTER — Ambulatory Visit: Payer: No Typology Code available for payment source | Admitting: Internal Medicine

## 2022-10-12 ENCOUNTER — Ambulatory Visit: Payer: No Typology Code available for payment source | Admitting: Internal Medicine

## 2022-10-24 ENCOUNTER — Ambulatory Visit: Payer: No Typology Code available for payment source | Admitting: Internal Medicine

## 2022-10-31 ENCOUNTER — Other Ambulatory Visit: Payer: Self-pay | Admitting: Internal Medicine

## 2022-10-31 DIAGNOSIS — N3946 Mixed incontinence: Secondary | ICD-10-CM

## 2022-11-27 ENCOUNTER — Encounter: Payer: Self-pay | Admitting: Orthopaedic Surgery

## 2022-11-27 ENCOUNTER — Other Ambulatory Visit (INDEPENDENT_AMBULATORY_CARE_PROVIDER_SITE_OTHER): Payer: No Typology Code available for payment source

## 2022-11-27 ENCOUNTER — Ambulatory Visit (INDEPENDENT_AMBULATORY_CARE_PROVIDER_SITE_OTHER): Payer: No Typology Code available for payment source | Admitting: Orthopaedic Surgery

## 2022-11-27 DIAGNOSIS — Z96641 Presence of right artificial hip joint: Secondary | ICD-10-CM

## 2022-11-27 NOTE — Progress Notes (Signed)
The patient is now just over a year status post a right total hip replacement.  She does report that she stays stiff quite a bit.  She is 57 years old and thin.  She stays active and walks quite a bit.  She has been having some left hip and groin pain and some groin pain on the right operative hip.  Her right hip actually moves smoothly and fluidly with no blocks to rotation and no pain today at all.  The left hip does have some stiffness with pain on internal and external rotation.  Standing AP pelvis and lateral of the right hip shows a well-seated total hip arthroplasty on the right side.  The left hip does show moderate arthritis with superior lateral joint space narrowing and flattening of the femoral head indicative of arthritis.  I talked her about the possibility of physical therapy.  Since she walks quite a bit she said that she will just do exercises on her own.  However if he gets to where her left hip is hurting her enough she knows to call us and come back and see [place] because we would consider her for hip replacement on that hip if she has worsening pain.  This is definitely based on her x-ray findings and clinical exam findings.  All questions and concerns were addressed and answered.

## 2022-12-08 ENCOUNTER — Other Ambulatory Visit: Payer: Self-pay | Admitting: Internal Medicine

## 2022-12-13 ENCOUNTER — Other Ambulatory Visit: Payer: Self-pay | Admitting: Internal Medicine

## 2022-12-13 DIAGNOSIS — D508 Other iron deficiency anemias: Secondary | ICD-10-CM

## 2023-01-09 ENCOUNTER — Other Ambulatory Visit: Payer: Self-pay | Admitting: Internal Medicine

## 2023-01-09 DIAGNOSIS — D508 Other iron deficiency anemias: Secondary | ICD-10-CM

## 2023-01-15 NOTE — Progress Notes (Unsigned)
Name: [name]  MRN/ DOB: [id number], 01-07-1966    Age/ Sex: 57 y.o., female     PCP: Marcine Matar, MD   Reason for Endocrinology Evaluation: [name]' Disease     Initial Endocrinology Clinic Visit: 01/09/2019    PATIENT IDENTIFIER: [name] is a 57 y.o., female with a past medical history of granulomatous lung disease, headaches, graves' disease and anxiety  . She has followed with [facility] since 01/09/2019  for consultative assistance with management of her graves' disease     HISTORICAL SUMMARY: The patient was first diagnosed with Graves' disease ~2012  .Over the years she has been prescribed  methimazole , but would take it on and off due to non-compliance issues , her thyroid condition has never been optimally controlled.   On her initial visit to our clinic she was not taking methimazole for ~ 3 months prior to her presentation.   She is S/P RAI ablation on 02/21/2019 with 13.8 mCi I-131 sodium iodide LT- 4 replacement started 04/2019 due to low FT4.   No FH of thyroid disease  Sister with lupus   SUBJECTIVE:     Today (01/16/2023):  [name] is here for a follow up on hyperthyroidism . She is S/P RAI ablation in 01/2019  She continues to follow-up with orthopedics for history of right total hip replacement 10/2022 She has been following up with ENT for chronic rhinitis   Weight is trending up  She did not see Dr. Laruth Bouchard Office  Denies palpitations  Minimal tremors Denies  diarrhea with rare constipation  Denies local neck symptoms  Has occasional blurry vision     Levothyroxine 88 mcg , Half a tablet on Sundays, 1 tablet the rest of the week    HISTORY:  Past Medical History:  Past Medical History:  Diagnosis Date   Anxiety DX 2011   Arthritis    Depression DX 1990   Granulomatous lung disease (HCC)    Grave's disease    Graves Disease   History of radioactive iodine thyroid ablation    02/21/2019    Menometrorrhagia    Migraines    Pneumonia    Past Surgical History:  Past Surgical History:  Procedure Laterality Date   ABDOMINAL HYSTERECTOMY     TOTAL HIP ARTHROPLASTY Right 11/04/2021   Procedure: RIGHT TOTAL HIP ARTHROPLASTY ANTERIOR APPROACH;  Surgeon: Kathryne Hitch, MD;  Location: [facility];  Service: Orthopedics;  Laterality: Right;   TUBAL LIGATION     Social History:  reports that she has been smoking cigarettes. She has a 16 pack-year smoking history. She has never used smokeless tobacco. She reports current alcohol use. She reports that she does not use drugs. Family History:  Family History  Problem Relation Age of Onset   Hypertension Mother    Heart attack Mother    Hypertension Brother    Hypertension Father    Allergies Father    Allergies Other        children   Breast cancer Maternal Aunt    Breast cancer Maternal Aunt      HOME MEDICATIONS: Allergies as of 01/16/2023   No Known Allergies      Medication List        Accurate as of January 16, 2023  8:26 AM. If you have any questions, ask your nurse or doctor.          amLODipine 5 MG tablet Commonly known as: NORVASC Take 1  tablet (5 mg total) by mouth daily. Please make PCP appointment for more refills.   aspirin 81 MG chewable tablet Chew 1 tablet (81 mg total) by mouth 2 (two) times daily.   ferrous sulfate 325 (65 FE) MG tablet Take 1 tablet (325 mg total) by mouth daily with breakfast. Please make an appointment with your PCP.   gabapentin 300 MG capsule Commonly known as: NEURONTIN Take 1 capsule (300 mg total) by mouth at bedtime.   ibuprofen 800 MG tablet Commonly known as: ADVIL Take 1 tablet (800 mg total) by mouth every 8 (eight) hours as needed.   levothyroxine 88 MCG tablet Commonly known as: SYNTHROID Half a tablet on Sundays, 1 tablet Monday through Saturday   meloxicam 15 MG tablet Commonly known as: MOBIC TAKE 1 TABLET BY MOUTH EVERY DAY AS NEEDED FOR  PAIN   nicotine 14 mg/24hr patch Commonly known as: NICODERM CQ - dosed in mg/24 hours Place 1 patch (14 mg total) onto the skin daily.   solifenacin 5 MG tablet Commonly known as: VESICARE TAKE 1 TABLET (5 MG TOTAL) BY MOUTH DAILY.   tiZANidine 4 MG tablet Commonly known as: ZANAFLEX TAKE 1 TABLET (4 MG TOTAL) BY MOUTH EVERY 8 (EIGHT) HOURS AS NEEDED FOR MUSCLE SPASMS   Varenicline Tartrate (Starter) 0.5 MG X 11 & 1 MG X 42 Tbpk Commonly known as: Chantix Starting Month Pak UAD on package   Vitamin D (Ergocalciferol) 1.25 MG (50000 UNIT) Caps capsule Commonly known as: DRISDOL Take 1 capsule (50,000 Units total) by mouth every 7 (seven) days.          OBJECTIVE:   PHYSICAL EXAM: VS: BP 126/84 (BP Location: Left Arm, Patient Position: Sitting, Cuff Size: Small)   Pulse 78   Ht 5\' 8"  (1.727 m)   Wt 163 lb (73.9 kg)   LMP 04/23/2011   SpO2 97%   BMI 24.78 kg/m    EXAM: General: Pt appears well and is in NAD   Neck: General: Supple without adenopathy. Thyroid: Thyroid size normal.  No goiter  appreciated.  Lungs: Clear with good BS bilat   Heart: Auscultation: RRR.  Abdomen: soft, nontender  Extremities:  BL LE: No pretibial edema normal   Mental Status: Judgment, insight: Intact Orientation: Oriented to time, place, and person Mood and affect: No depression, anxiety, or agitation     DATA REVIEWED:   Latest Reference Range & Units 01/16/23 08:31  TSH 0.35 - 5.50 uIU/mL 1.73  T4,Free(Direct) 0.60 - 1.60 ng/dL 5.36     ASSESSMENT / PLAN / RECOMMENDATIONS:   Postablative Hypothyroidism  - She is S/P RAI Ablation on 02/21/2019 with 13.8 mCi I-131 sodium iodide - She is clinically euthyroid  -TFTs remain within normal range, no change  Medications   Continue  Levothyroxine 88 mcg , Half a tablet on Sundays, 1 tablet the rest of the week   2. Graves' Disease:   -She was referred to Dr. Phoebe Sharps office, patient will reschedule appointment    F/U  in 1 year    Signed electronically by: Lyndle Herrlich, MD  [facility]  [facility] [address] [place] [place], [place] [address] Phone: [phone] FAX: [phone]      CC: Marcine Matar, MD [address] [place] [address] Phone: [phone]  Fax: [phone]   Return to Endocrinology clinic as below: No future appointments.

## 2023-01-16 ENCOUNTER — Ambulatory Visit (INDEPENDENT_AMBULATORY_CARE_PROVIDER_SITE_OTHER): Payer: No Typology Code available for payment source | Admitting: Internal Medicine

## 2023-01-16 ENCOUNTER — Encounter: Payer: Self-pay | Admitting: Internal Medicine

## 2023-01-16 VITALS — BP 126/84 | HR 78 | Ht 68.0 in | Wt 163.0 lb

## 2023-01-16 DIAGNOSIS — E0500 Thyrotoxicosis with diffuse goiter without thyrotoxic crisis or storm: Secondary | ICD-10-CM | POA: Diagnosis not present

## 2023-01-16 DIAGNOSIS — E890 Postprocedural hypothyroidism: Secondary | ICD-10-CM | POA: Diagnosis not present

## 2023-01-16 LAB — T4, FREE: Free T4: 0.91 ng/dL (ref 0.60–1.60)

## 2023-01-16 LAB — TSH: TSH: 1.73 u[IU]/mL (ref 0.35–5.50)

## 2023-01-16 MED ORDER — LEVOTHYROXINE SODIUM 88 MCG PO TABS: ORAL_TABLET | ORAL | 3 refills | Status: DC

## 2023-01-16 NOTE — Patient Instructions (Signed)

## 2023-01-21 ENCOUNTER — Other Ambulatory Visit: Payer: Self-pay | Admitting: Internal Medicine

## 2023-01-21 DIAGNOSIS — D508 Other iron deficiency anemias: Secondary | ICD-10-CM

## 2023-02-09 ENCOUNTER — Ambulatory Visit: Payer: No Typology Code available for payment source | Attending: Internal Medicine | Admitting: Internal Medicine

## 2023-02-09 ENCOUNTER — Encounter: Payer: Self-pay | Admitting: Internal Medicine

## 2023-02-09 ENCOUNTER — Other Ambulatory Visit: Payer: Self-pay

## 2023-02-09 VITALS — BP 130/82 | HR 81 | Temp 98.7°F | Ht 68.0 in | Wt 164.0 lb

## 2023-02-09 DIAGNOSIS — N3946 Mixed incontinence: Secondary | ICD-10-CM | POA: Diagnosis not present

## 2023-02-09 DIAGNOSIS — F5101 Primary insomnia: Secondary | ICD-10-CM

## 2023-02-09 DIAGNOSIS — N951 Menopausal and female climacteric states: Secondary | ICD-10-CM | POA: Diagnosis not present

## 2023-02-09 DIAGNOSIS — F17200 Nicotine dependence, unspecified, uncomplicated: Secondary | ICD-10-CM

## 2023-02-09 DIAGNOSIS — Z5181 Encounter for therapeutic drug level monitoring: Secondary | ICD-10-CM

## 2023-02-09 DIAGNOSIS — Z23 Encounter for immunization: Secondary | ICD-10-CM

## 2023-02-09 DIAGNOSIS — I10 Essential (primary) hypertension: Secondary | ICD-10-CM | POA: Diagnosis not present

## 2023-02-09 DIAGNOSIS — Z122 Encounter for screening for malignant neoplasm of respiratory organs: Secondary | ICD-10-CM

## 2023-02-09 MED ORDER — AMLODIPINE BESYLATE 5 MG PO TABS
5.0000 mg | ORAL_TABLET | Freq: Every day | ORAL | 1 refills | Status: DC
Start: 2023-02-09 — End: 2023-04-06

## 2023-02-09 MED ORDER — SOLIFENACIN SUCCINATE 10 MG PO TABS
10.0000 mg | ORAL_TABLET | Freq: Every day | ORAL | 1 refills | Status: DC
Start: 2023-02-09 — End: 2023-06-12

## 2023-02-09 MED ORDER — NICOTINE 21 MG/24HR TD PT24
21.0000 mg | MEDICATED_PATCH | Freq: Every day | TRANSDERMAL | 1 refills | Status: DC
Start: 2023-02-09 — End: 2023-04-06

## 2023-02-09 MED ORDER — VEOZAH 45 MG PO TABS
45.0000 mg | ORAL_TABLET | Freq: Every day | ORAL | 2 refills | Status: DC
Start: 2023-02-09 — End: 2023-04-06

## 2023-02-09 NOTE — Patient Instructions (Signed)
Purchase melatonin 3 mg over-the-counter and take 1 at bedtime.  This is information about Veozah Fezolinetant Tablets What is this medication? FEZOLINETANT (FEZ oh LIN e tant) reduces the number and severity of hot flashes due to menopause. It works by blocking substances in your body that cause hot flashes and night sweats. This medicine may be used for other purposes; ask your health care provider or pharmacist if you have questions. COMMON BRAND NAME(S): VEOZAH What should I tell my care team before I take this medication? They need to know if you have any of these conditions: Kidney disease Liver disease An unusual or allergic reaction to fezolinetant, other medications, foods, dyes, or preservatives Pregnant or trying to get pregnant Breastfeeding How should I use this medication? Take this medication by mouth with water. Take it as directed on the prescription label at the same time every day. Do not cut, crush, or chew this medication. Swallow the tablets whole. You can take it with or without food. If it upsets your stomach, take it with food. Keep taking it unless your care team tells you to stop. Talk to your care team about the use of this medication in children. Special care may be needed. Overdosage: If you think you have taken too much of this medicine contact a poison control center or emergency room at once. NOTE: This medicine is only for you. Do not share this medicine with others. What if I miss a dose? If you miss a dose, take it as soon as you can unless it is more than 12 hours late. If it is more than 12 hours late, skip the missed dose. Take the next dose at the normal time. What may interact with this medication? Other medications may affect the way this medication works. Talk with your care team about all of the medications you take. They may suggest changes to your treatment plan to lower the risk of side effects and to make sure your medications work as  intended. This list may not describe all possible interactions. Give your health care provider a list of all the medicines, herbs, non-prescription drugs, or dietary supplements you use. Also tell them if you smoke, drink alcohol, or use illegal drugs. Some items may interact with your medicine. What should I watch for while using this medication? Visit your care team for regular checks on your progress. Tell your care team if your symptoms do not start to get better or if they get worse. You may need blood work while taking this medication. What side effects may I notice from receiving this medication? Side effects that you should report to your care team as soon as possible: Allergic reactions--skin rash, itching, hives, swelling of the face, lips, tongue, or throat Liver injury--right upper belly pain, loss of appetite, nausea, light-colored stool, dark yellow or brown urine, yellowing skin or eyes, unusual weakness or fatigue Side effects that usually do not require medical attention (report these to your care team if they continue or are bothersome): Back pain Diarrhea Hot flashes Stomach pain Trouble sleeping This list may not describe all possible side effects. Call your doctor for medical advice about side effects. You may report side effects to FDA at 1-800-FDA-1088. Where should I keep my medication? Keep out of the reach of children and pets. Store at room temperature between 20 and 25 degrees C (68 and 77 degrees F). Get rid of any unused medication after the expiration date. To get rid of medications that are  no longer needed or have expired: Take the medication to a medication take-back program. Check with your pharmacy or law enforcement to find a location. If you cannot return the medication, check the label or package insert to see if the medication should be thrown out in the garbage or flushed down the toilet. If you are not sure, ask your care team. If it is safe to put it in  the trash, take the medication out of the container. Mix the medication with cat litter, dirt, coffee grounds, or other unwanted substance. Seal the mixture in a bag or container. Put it in the trash. NOTE: This sheet is a summary. It may not cover all possible information. If you have questions about this medicine, talk to your doctor, pharmacist, or health care provider.  2024 Elsevier/Gold Standard (2021-09-22 00:00:00)
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
## 2023-02-10 ENCOUNTER — Other Ambulatory Visit: Payer: Self-pay | Admitting: Internal Medicine

## 2023-02-10 DIAGNOSIS — Z5181 Encounter for therapeutic drug level monitoring: Secondary | ICD-10-CM

## 2023-02-10 LAB — HEPATIC FUNCTION PANEL
ALT: 11 [IU]/L (ref 0–32)
AST: 15 [IU]/L (ref 0–40)
Albumin: 4.3 g/dL (ref 3.8–4.9)
Alkaline Phosphatase: 98 [IU]/L (ref 44–121)
Bilirubin Total: <0.2 mg/dL (ref 0.0–1.2)
Bilirubin, Direct: <0.1 mg/dL (ref 0.00–0.40)
Total Protein: 7.1 g/dL (ref 6.0–8.5)

## 2023-02-22 ENCOUNTER — Other Ambulatory Visit: Payer: Self-pay | Admitting: Internal Medicine

## 2023-02-22 DIAGNOSIS — D508 Other iron deficiency anemias: Secondary | ICD-10-CM

## 2023-02-23 NOTE — Telephone Encounter (Signed)
Requested medication (s) are due for refill today: Yes  Requested medication (s) are on the active medication list: Yes  Last refill:  01/22/23  Future visit scheduled: Yes  Notes to clinic:  Pharmacy requests 90 day supply.    Requested Prescriptions  Pending Prescriptions Disp Refills   ferrous sulfate 325 (65 FE) MG tablet [Pharmacy Med Name: FERROUS SULFATE 325 MG TABLET] 90 tablet 1    Sig: TAKE 1 TABLET BY MOUTH DAILY WITH BREAKFAST. PLEASE MAKE AN APPOINTMENT WITH YOUR PCP.     Endocrinology:  Minerals - Iron Supplementation Passed - 02/22/2023  9:32 AM      Passed - HGB in normal range and within 360 days    Hemoglobin  Date Value Ref Range Status  06/13/2022 12.7 11.1 - 15.9 g/dL Final         Passed - HCT in normal range and within 360 days    Hematocrit  Date Value Ref Range Status  06/13/2022 38.2 34.0 - 46.6 % Final         Passed - RBC in normal range and within 360 days    RBC  Date Value Ref Range Status  06/13/2022 4.45 3.77 - 5.28 x10E6/uL Final  11/07/2021 2.85 (L) 3.87 - 5.11 MIL/uL Final         Passed - Fe (serum) in normal range and within 360 days    Iron  Date Value Ref Range Status  06/13/2022 45 27 - 159 ug/dL Final   %SAT  Date Value Ref Range Status  02/25/2020 11 (L) 16 - 45 % (calc) Final   Iron Saturation  Date Value Ref Range Status  06/13/2022 12 (L) 15 - 55 % Final         Passed - Ferritin in normal range and within 360 days    Ferritin  Date Value Ref Range Status  06/13/2022 53 15 - 150 ng/mL Final         Passed - Valid encounter within last 12 months    Recent Outpatient Visits           2 weeks ago Urinary incontinence, mixed   [facility] Marcine Matar, MD   8 months ago Urinary incontinence, mixed   [place] [facility] Marcine Matar, MD   9 months ago Essential hypertension   [facility] [place],  Cornelius Moras, RPH-CPP   11 months ago Essential hypertension   [facility] [place], Dormont L, RPH-CPP   1 year ago Acute frontal sinusitis, recurrence not specified   [facility] Marcine Matar, MD       Future Appointments             In 3 months Laural Benes, Binnie Rail, MD [facility]

## 2023-04-06 ENCOUNTER — Other Ambulatory Visit: Payer: Self-pay | Admitting: Internal Medicine

## 2023-04-06 DIAGNOSIS — N951 Menopausal and female climacteric states: Secondary | ICD-10-CM

## 2023-04-06 DIAGNOSIS — F17200 Nicotine dependence, unspecified, uncomplicated: Secondary | ICD-10-CM

## 2023-04-06 DIAGNOSIS — I10 Essential (primary) hypertension: Secondary | ICD-10-CM

## 2023-04-06 NOTE — Telephone Encounter (Signed)
Medication Refill -  Most Recent Primary Care Visit:  Provider: Jonah Blue B  Department: [facility]  Visit Type: OFFICE VISIT  Date: 02/09/2023  Medication: Pt is calling to change the pharmacy. As she never received her medications.   amLODipine (NORVASC) 5 MG tablet [[id number]]   Fezolinetant (VEOZAH) 45 MG TABS [[id number]]   nicotine (NICODERM CQ - DOSED IN MG/24 HOURS) 21 mg/24hr patch [[id number]]    Has the patient contacted their pharmacy? Yes (Agent: If no, request that the patient contact the pharmacy for the refill. If patient does not wish to contact the pharmacy document the reason why and proceed with request.) (Agent: If yes, when and what did the pharmacy advise?)  Is this the correct pharmacy for this prescription? Yes If no, delete pharmacy and type the correct one.  This is the patient's preferred pharmacy:  [id number] [place], [place] - [address] Phone: [phone]  Fax: [phone]      Has the prescription been filled recently? Yes  Is the patient out of the medication? Yes  Has the patient been seen for an appointment in the last year OR does the patient have an upcoming appointment? Yes  Can we respond through MyChart? No  Agent: Please be advised that Rx refills may take up to 3 business days. We ask that you follow-up with your pharmacy.

## 2023-04-09 MED ORDER — NICOTINE 21 MG/24HR TD PT24: 21.0000 mg | MEDICATED_PATCH | Freq: Every day | TRANSDERMAL | 1 refills | Status: DC

## 2023-04-09 MED ORDER — AMLODIPINE BESYLATE 5 MG PO TABS: 5.0000 mg | ORAL_TABLET | Freq: Every day | ORAL | 1 refills | Status: DC

## 2023-04-09 NOTE — Telephone Encounter (Signed)
Requested Prescriptions  Pending Prescriptions Disp Refills   amLODipine (NORVASC) 5 MG tablet 90 tablet 1    Sig: Take 1 tablet (5 mg total) by mouth daily. Please make PCP appointment for more refills.     Cardiovascular: Calcium Channel Blockers 2 Passed - 04/06/2023  5:44 PM      Passed - Last BP in normal range    BP Readings from Last 1 Encounters:  02/09/23 130/82         Passed - Last Heart Rate in normal range    Pulse Readings from Last 1 Encounters:  02/09/23 81         Passed - Valid encounter within last 6 months    Recent Outpatient Visits           1 month ago Urinary incontinence, mixed   [place] [facility] [place] [facility] [place]. [facility] Marcine Matar, MD   10 months ago Urinary incontinence, mixed   [place] [facility]egino Ramirez - A Dept Of [place]. [facility] Marcine Matar, MD   11 months ago Essential hypertension   [place] [facility] [place] - [facility] Dept Of [place]. [facility] Drucilla Chalet, RPH-CPP   1 year ago Essential hypertension   [place] [facility] Rafael Gonzalez - A Dept Of [place]. [facility] Drucilla Chalet, RPH-CPP   1 year ago Acute frontal sinusitis, recurrence not specified   [place] [facility] Merry Proud - A Dept Of [place]. [facility] Marcine Matar, MD       Future Appointments             In 2 months Laural Benes Binnie Rail, MD [facility] [place] - A Dept Of Eligha Bridegroom. [facility]             Fezolinetant (VEOZAH) 45 MG TABS 30 tablet 2    Sig: Take 1 tablet (45 mg total) by mouth daily.     Off-Protocol Failed - 04/06/2023  5:44 PM      Failed - Medication not assigned to a protocol, review manually.      Passed - Valid encounter within last 12 months    Recent Outpatient Visits           1 month ago Urinary incontinence, mixed   [place] [facility] - A Dept Of [place]. [facility] Marcine Matar, MD   10 months ago Urinary incontinence, mixed   [place] [facility] - A Dept Of [place]. [facility] Marcine Matar, MD   11 months ago Essential hypertension   [place] [facility] [place] - A Dept Of [place]. [facility] Drucilla Chalet, RPH-CPP   1 year ago Essential hypertension   [place] [facility] [place] - A Dept Of [place]. [facility] Drucilla Chalet, RPH-CPP   1 year ago Acute frontal sinusitis, recurrence not specified   [place] [facility] Merry Proud - A Dept Of [place]. [facility] Marcine Matar, MD       Future Appointments             In 2 months Laural Benes Binnie Rail, MD [facility] [place] - A Dept Of Eligha Bridegroom. [facility]  nicotine (NICODERM CQ - DOSED IN MG/24 HOURS) 21 mg/24hr patch 28 patch 1    Sig: Place 1 patch (21 mg total) onto the skin daily.     Psychiatry:  Drug Dependence Therapy Passed - 04/06/2023  5:44 PM      Passed - Valid encounter within last 12 months    Recent Outpatient Visits           1 month ago Urinary incontinence, mixed   [place] [facility] - A Dept Of [place]. [facility] Marcine Matar, MD   10 months ago Urinary incontinence, mixed   [place] [facility] [place] - A Dept Of [place]. [facility] Marcine Matar, MD   11 months ago Essential hypertension   [place] [facility] [place] - A Dept Of [place]. [facility] Drucilla Chalet, RPH-CPP   1 year ago Essential hypertension   [place] [facility] [place] - A Dept Of [place]. [facility] Drucilla Chalet, RPH-CPP   1 year ago Acute frontal sinusitis, recurrence not specified   [place] [facility] Merry Proud - A Dept Of [place]. [facility] Marcine Matar, MD       Future Appointments             In 2 months Laural Benes Binnie Rail,  MD [facility] [place] - A Dept Of Eligha Bridegroom. [facility]

## 2023-04-09 NOTE — Telephone Encounter (Signed)
Requested medications are due for refill today.  See note  Requested medications are on the active medications list.  yes  Last refill. 02/09/2023 #30 2 rf  Future visit scheduled.   yes  Notes to clinic.  Medication not assigned to a protocol. Pt states she did not receive meds ordered and so wants the medication to be filed locally.    Requested Prescriptions  Pending Prescriptions Disp Refills   Fezolinetant (VEOZAH) 45 MG TABS 30 tablet 2    Sig: Take 1 tablet (45 mg total) by mouth daily.     Off-Protocol Failed - 04/06/2023  5:44 PM      Failed - Medication not assigned to a protocol, review manually.      Passed - Valid encounter within last 12 months    Recent Outpatient Visits           1 month ago Urinary incontinence, mixed   [place] [facility] - A Dept Of [place]. [facility] Marcine Matar, MD   10 months ago Urinary incontinence, mixed   [place] [facility] [place] - A Dept Of [place]. [facility] Marcine Matar, MD   11 months ago Essential hypertension   [place] [facility] [place] - A Dept Of [place]. [facility] Drucilla Chalet, RPH-CPP   1 year ago Essential hypertension   [place] [facility] [place] - A Dept Of [place]. [facility] Drucilla Chalet, RPH-CPP   1 year ago Acute frontal sinusitis, recurrence not specified   [place] [facility] Merry Proud - A Dept Of [place]. [facility] Marcine Matar, MD       Future Appointments             In 2 months Laural Benes Binnie Rail, MD [facility] [place] - A Dept Of Eligha Bridegroom. [facility]            Signed Prescriptions Disp Refills   amLODipine (NORVASC) 5 MG tablet 90 tablet 1    Sig: Take 1 tablet (5 mg total) by mouth daily. Please make PCP appointment for more refills.     Cardiovascular: Calcium Channel Blockers 2 Passed - 04/06/2023  5:44 PM      Passed - Last BP in normal  range    BP Readings from Last 1 Encounters:  02/09/23 130/82         Passed - Last Heart Rate in normal range    Pulse Readings from Last 1 Encounters:  02/09/23 81         Passed - Valid encounter within last 6 months    Recent Outpatient Visits           1 month ago Urinary incontinence, mixed   [place] [facility] [place] - A Dept Of [place]. [facility] Marcine Matar, MD   10 months ago Urinary incontinence, mixed   [place] [facility] [place] - A Dept Of [place]. [facility] Marcine Matar, MD   11 months ago Essential hypertension   [place] [facility] [place] - A Dept Of [place]. [facility] Drucilla Chalet, RPH-CPP   1 year ago Essential hypertension   [place] [facility] [place] - A Dept Of [place]. [facility] Lois Huxley, [place] L, RPH-CPP   1 year ago Acute frontal sinusitis, recurrence not specified  [place] [facility] [place] - A Dept Of [place]. [facility] Marcine Matar, MD       Future Appointments             In 2 months Laural Benes Binnie Rail, MD [facility] [place] - A Dept Of Eligha Bridegroom. [facility]             nicotine (NICODERM CQ - DOSED IN MG/24 HOURS) 21 mg/24hr patch 28 patch 1    Sig: Place 1 patch (21 mg total) onto the skin daily.     Psychiatry:  Drug Dependence Therapy Passed - 04/06/2023  5:44 PM      Passed - Valid encounter within last 12 months    Recent Outpatient Visits           1 month ago Urinary incontinence, mixed   [place] [facility] - A Dept Of [place]. [facility] Marcine Matar, MD   10 months ago Urinary incontinence, mixed   [place] [facility] [place] - A Dept Of [place]. [facility] Marcine Matar, MD   11 months ago Essential hypertension   [place] [facility] [place] - A Dept Of [place]. [facility] Drucilla Chalet, RPH-CPP   1 year  ago Essential hypertension   [place] Comm [facility] - A Dept Of [place]. [facility] Drucilla Chalet, RPH-CPP   1 year ago Acute frontal sinusitis, recurrence not specified    Comm Health Merry Proud - A Dept Of [place]. [facility] Marcine Matar, MD       Future Appointments             In 2 months Laural Benes Binnie Rail, MD [facility] [facility] - A Dept Of Eligha Bridegroom. [facility]

## 2023-04-10 ENCOUNTER — Telehealth: Payer: Self-pay | Admitting: Pharmacist

## 2023-04-10 ENCOUNTER — Other Ambulatory Visit: Payer: Self-pay

## 2023-04-10 ENCOUNTER — Other Ambulatory Visit: Payer: Self-pay | Admitting: Internal Medicine

## 2023-04-10 DIAGNOSIS — N951 Menopausal and female climacteric states: Secondary | ICD-10-CM

## 2023-04-10 MED ORDER — VEOZAH 45 MG PO TABS: 45.0000 mg | ORAL_TABLET | Freq: Every day | ORAL | 0 refills | Status: DC

## 2023-04-10 NOTE — Telephone Encounter (Signed)
Can we start a PA for this patient's Veozah?

## 2023-04-13 ENCOUNTER — Other Ambulatory Visit: Payer: Self-pay

## 2023-05-09 ENCOUNTER — Ambulatory Visit
Admission: EM | Admit: 2023-05-09 | Discharge: 2023-05-09 | Disposition: A | Discharge status: Home / Self Care | Payer: No Typology Code available for payment source | Attending: Family Medicine | Admitting: Family Medicine

## 2023-05-09 ENCOUNTER — Encounter: Payer: Self-pay | Admitting: Emergency Medicine

## 2023-05-09 ENCOUNTER — Ambulatory Visit: Payer: No Typology Code available for payment source | Admitting: Radiology

## 2023-05-09 DIAGNOSIS — R059 Cough, unspecified: Secondary | ICD-10-CM | POA: Diagnosis not present

## 2023-05-09 DIAGNOSIS — J209 Acute bronchitis, unspecified: Secondary | ICD-10-CM | POA: Diagnosis not present

## 2023-05-09 MED ORDER — ALBUTEROL SULFATE HFA 108 (90 BASE) MCG/ACT IN AERS: 2.0000 | INHALATION_SPRAY | Freq: Four times a day (QID) | RESPIRATORY_TRACT | 0 refills | Status: DC | PRN

## 2023-05-09 MED ORDER — PROMETHAZINE-DM 6.25-15 MG/5ML PO SYRP: 5.0000 mL | ORAL_SOLUTION | Freq: Four times a day (QID) | ORAL | 0 refills | Status: AC | PRN

## 2023-05-09 NOTE — ED Provider Notes (Signed)
 [facility]    CSN: [id number] Arrival date & time: 05/09/23  1731      History   Chief Complaint Chief Complaint  Patient presents with   Cough    HPI [name] is a 58 y.o. female.   The history is provided by the patient.  Cough Associated symptoms: fever, shortness of breath and wheezing   Associated symptoms: no headaches   Sick for 5 days symptoms congestion, breath.  Onset of illness resolved.  Admits wheezing, chest discomfort, nasal congestion, rhinorrhea.  Admits fatigue.  Denies body aches, abdominal pain, nausea, vomiting, diarrhea.  Admits family member with similar symptoms.  No confirmed COVID or flu exposures.  No history of asthma.  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   Graves disease 04/10/2019   Prediabetes 10/18/2018   Tinea pedis 07/31/2014   Screening for HIV (human immunodeficiency virus) 07/31/2014   Cramp of limb 01/13/2011   Sarcoidosis of lung (HCC) 12/30/2010   Rhinitis, allergic 11/29/2010   Urticaria, idiopathic 10/27/2010   COPD, mild (HCC) 08/06/2010   Graves' disease 06/03/2010   MIGRAINE HEADACHE 11/12/2008   ROTATOR CUFF TEAR 11/12/2008   DEPRESSION, RECURRENT 05/12/2008   ANXIETY DISORDER, GENERALIZED  02/05/2007   TOBACCO ABUSE 02/05/2007    Past Surgical History:  Procedure Laterality Date   ABDOMINAL HYSTERECTOMY     TOTAL HIP ARTHROPLASTY Right 11/04/2021   Procedure: RIGHT TOTAL HIP ARTHROPLASTY ANTERIOR APPROACH;  Surgeon: Vernetta Lonni CINDERELLA, MD;  Location: [facility];  Service: Orthopedics;  Laterality: Right;   TUBAL LIGATION      OB History   No obstetric history on file.      Home Medications    Prior to Admission medications   Medication Sig Start Date End Date Taking? Authorizing Provider  amLODipine  (NORVASC ) 5 MG tablet Take 1 tablet (5 mg total) by mouth daily. Please make PCP appointment for more refills. 04/09/23   Vicci Barnie NOVAK, MD  aspirin  81 MG chewable tablet Chew 1 tablet (81 mg total) by mouth 2 (two) times daily. 11/05/21   Vernetta Lonni CINDERELLA, MD  ferrous sulfate  325 (65 FE) MG tablet TAKE 1 TABLET BY MOUTH DAILY WITH BREAKFAST. PLEASE MAKE AN APPOINTMENT WITH YOUR PCP. 02/23/23   Vicci Barnie NOVAK, MD  Fezolinetant  (VEOZAH ) 45 MG TABS Take 1 tablet (45 mg total) by mouth daily. Please return to Dr. Ferdie office for labs before we can give additional refills. 04/10/23   Vicci Barnie NOVAK, MD  ibuprofen  (ADVIL ) 800 MG tablet Take 1 tablet (800 mg total) by mouth every 8 (eight) hours as needed. 03/13/22   Vernetta Lonni CINDERELLA, MD  levothyroxine  (SYNTHROID ) 88 MCG tablet Half a tablet on Sundays, 1 tablet Monday  through Saturday 01/16/23   Shamleffer, Ibtehal Jaralla, MD  meloxicam  (MOBIC ) 15 MG tablet TAKE 1 TABLET BY MOUTH EVERY DAY AS NEEDED FOR PAIN 07/24/22   Vernetta Lonni GRADE, MD  nicotine  (NICODERM CQ  - DOSED IN MG/24 HOURS) 21 mg/24hr patch Place 1 patch (21 mg total) onto the skin daily. 04/09/23   Vicci Barnie NOVAK, MD  solifenacin  (VESICARE ) 10 MG tablet Take 1 tablet (10 mg total) by mouth daily. 02/09/23   Vicci Barnie NOVAK, MD  tiZANidine  (ZANAFLEX ) 4 MG tablet TAKE 1 TABLET (4 MG TOTAL) BY MOUTH EVERY 8 (EIGHT) HOURS AS NEEDED  FOR MUSCLE SPASMS 07/24/22   Vernetta Lonni GRADE, MD  Varenicline  Tartrate, Starter, (CHANTIX  STARTING MONTH PAK) 0.5 MG X 11 & 1 MG X 42 TBPK UAD on package 02/10/22   Vicci Barnie NOVAK, MD  Vitamin D , Ergocalciferol , (DRISDOL ) 1.25 MG (50000 UNIT) CAPS capsule Take 1 capsule (50,000 Units total) by mouth every 7 (seven) days. 02/10/22   Vicci Barnie NOVAK, MD    Family History Family History  Problem Relation Age of Onset   Hypertension Mother    Heart attack Mother    Hypertension Brother    Hypertension Father    Allergies Father    Allergies Other        children   Breast cancer Maternal Aunt    Breast cancer Maternal Aunt     Social History Social History   Tobacco Use   Smoking status: Some Days    Current packs/day: 0.50    Average packs/day: 0.5 packs/day for 32.0 years (16.0 ttl pk-yrs)    Types: Cigarettes   Smokeless tobacco: Never  Vaping Use   Vaping status: Never Used  Substance Use Topics   Alcohol use: Yes    Comment: occasional beer   Drug use: No     Allergies   Patient has no known allergies.   Review of Systems Review of Systems  Constitutional:  Positive for fatigue and fever.  HENT:  Positive for congestion. Negative for sinus pain.   Respiratory:  Positive for cough, chest tightness, shortness of breath and wheezing.   Neurological:  Negative for dizziness and headaches.     Physical Exam Triage Vital Signs ED Triage Vitals  Encounter Vitals Group     BP 05/09/23 1749 121/86     Systolic BP Percentile --      Diastolic BP Percentile --      Pulse Rate 05/09/23 1749 93     Resp 05/09/23 1749 18     Temp 05/09/23 1749 98.8 F (37.1 C)     Temp Source 05/09/23 1749 Oral     SpO2 05/09/23 1749 90 %     Weight --      Height --      Head Circumference --      Peak Flow --      Pain Score 05/09/23 1750 6     Pain Loc --      Pain Education --      Exclude from Growth Chart --    No data found.  Updated Vital Signs BP  121/86 (BP Location: Right Arm)   Pulse 93   Temp 98.8 F (37.1 C) (Oral)   Resp 18   LMP 04/23/2011   SpO2 90%   Visual Acuity Right Eye Distance:   Left Eye Distance:   Bilateral Distance:    Right Eye Near:   Left Eye Near:    Bilateral Near:  Physical Exam Vitals and nursing note reviewed.  Constitutional:      General: She is not in acute distress.    Appearance: Normal appearance. She is not ill-appearing or toxic-appearing.  HENT:     Head: Normocephalic and atraumatic.     Right Ear: Tympanic membrane normal.     Left Ear: Tympanic membrane normal.     Nose: No rhinorrhea.  Eyes:     Conjunctiva/sclera: Conjunctivae normal.  Cardiovascular:     Rate and Rhythm: Regular rhythm.     Heart sounds: Normal heart sounds.  Pulmonary:     Breath sounds: No wheezing or rhonchi.     Comments: Repeat pulse ox 94% Musculoskeletal:     Cervical back: Neck supple.  Lymphadenopathy:     Cervical: No cervical adenopathy.  Skin:    General: Skin is warm and dry.  Neurological:     Mental Status: She is alert and oriented to person, place, and time.      UC Treatments / Results  Labs (all labs ordered are listed, but only abnormal results are displayed) Labs Reviewed - No data to display  EKG   Radiology No results found.  Procedures Procedures (including critical care time)  Medications Ordered in UC Medications - No data to display  Initial Impression / Assessment and Plan / UC Course  I have reviewed the triage vital signs and the nursing notes.  Pertinent labs & imaging results that were available during my care of the patient were reviewed by me and considered in my medical decision making (see chart for details).     58 year old female with cough for 5 days, reports fever fatigue, shortness of breath, wheezing.  Son with recent. Well-appearing nontoxic oxygen saturation 94%, lungs clear to auscultation, chest x-ray dependently viewed by me  NAD Rx cough medicine and inhaler, patient counseled to follow-up for recheck in a few days, ED for worsening symptoms Final Clinical Impressions(s) / UC Diagnoses   Final diagnoses:  Cough, unspecified type   Discharge Instructions   None    ED Prescriptions   None    PDMP not reviewed this encounter.   Valaria Kohut, [place] 05/09/23 1958

## 2023-05-09 NOTE — Discharge Instructions (Addendum)
 The x-ray reading we discussed is preliminary. Your x-ray will be read by a radiologist in next few hours. If there is a discrepancy, you will be contacted, and instructed on a new plan for you care.    Follow-up with your doctor Monday To the emergency department for new, worsening symptoms or concerns

## 2023-05-09 NOTE — ED Triage Notes (Addendum)
 Pt c/o cough, chest discomfort, wheezing and congestion for 4 days

## 2023-06-12 ENCOUNTER — Other Ambulatory Visit: Payer: Self-pay

## 2023-06-12 ENCOUNTER — Ambulatory Visit: Payer: No Typology Code available for payment source | Attending: Internal Medicine | Admitting: Internal Medicine

## 2023-06-12 ENCOUNTER — Encounter: Payer: Self-pay | Admitting: Internal Medicine

## 2023-06-12 VITALS — BP 129/88 | HR 80 | Temp 97.6°F | Ht 68.0 in | Wt 164.0 lb

## 2023-06-12 DIAGNOSIS — F411 Generalized anxiety disorder: Secondary | ICD-10-CM

## 2023-06-12 DIAGNOSIS — M542 Cervicalgia: Secondary | ICD-10-CM

## 2023-06-12 DIAGNOSIS — M25511 Pain in right shoulder: Secondary | ICD-10-CM

## 2023-06-12 DIAGNOSIS — F17200 Nicotine dependence, unspecified, uncomplicated: Secondary | ICD-10-CM

## 2023-06-12 DIAGNOSIS — N951 Menopausal and female climacteric states: Secondary | ICD-10-CM

## 2023-06-12 DIAGNOSIS — I10 Essential (primary) hypertension: Secondary | ICD-10-CM

## 2023-06-12 DIAGNOSIS — N3946 Mixed incontinence: Secondary | ICD-10-CM | POA: Diagnosis not present

## 2023-06-12 DIAGNOSIS — E559 Vitamin D deficiency, unspecified: Secondary | ICD-10-CM

## 2023-06-12 DIAGNOSIS — Z2821 Immunization not carried out because of patient refusal: Secondary | ICD-10-CM

## 2023-06-12 DIAGNOSIS — M25512 Pain in left shoulder: Secondary | ICD-10-CM

## 2023-06-12 MED ORDER — AMLODIPINE BESYLATE 10 MG PO TABS: 10.0000 mg | ORAL_TABLET | Freq: Every day | ORAL | 1 refills | Status: DC

## 2023-06-12 MED ORDER — SOLIFENACIN SUCCINATE 10 MG PO TABS: 10.0000 mg | ORAL_TABLET | Freq: Every day | ORAL | 1 refills | Status: DC

## 2023-06-12 MED ORDER — BUSPIRONE HCL 10 MG PO TABS: 10.0000 mg | ORAL_TABLET | Freq: Two times a day (BID) | ORAL | 2 refills | Status: DC

## 2023-06-12 MED ORDER — MELOXICAM 15 MG PO TABS: 15.0000 mg | ORAL_TABLET | Freq: Every day | ORAL | 0 refills | Status: DC

## 2023-06-12 MED ORDER — VEOZAH 45 MG PO TABS: 45.0000 mg | ORAL_TABLET | Freq: Every day | ORAL | 2 refills | Status: DC

## 2023-06-12 MED ORDER — CYCLOBENZAPRINE HCL 5 MG PO TABS: 5.0000 mg | ORAL_TABLET | Freq: Every day | ORAL | 1 refills | Status: DC | PRN

## 2023-06-12 NOTE — Patient Instructions (Addendum)
Your blood pressure is not at goal.  Increase amlodipine to 10 mg daily. I have sent prescription to your pharmacy for [facility] for the hot flashes.  I will have our clinical pharmacist work on trying to get prior approval from your insurance for this medication.  Start BuSpar 5 mg twice a day for anxiety.  Referral has been submitted to behavioral health for some counseling for you.  They will call you with the appointment.  Please call [facility] to schedule CAT scan of the lungs for lung cancer screening that was ordered back in October. Ph#: [phone]  [facility] [address]  [address]  [place], [place] [address]  Ph# [phone]  [facility]  P: [phone]
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
## 2023-06-13 ENCOUNTER — Other Ambulatory Visit: Payer: Self-pay | Admitting: Internal Medicine

## 2023-06-13 ENCOUNTER — Encounter: Payer: Self-pay | Admitting: Internal Medicine

## 2023-06-13 DIAGNOSIS — E559 Vitamin D deficiency, unspecified: Secondary | ICD-10-CM

## 2023-06-13 LAB — COMPREHENSIVE METABOLIC PANEL
ALT: 20 [IU]/L (ref 0–32)
AST: 22 [IU]/L (ref 0–40)
Albumin: 4.5 g/dL (ref 3.8–4.9)
Alkaline Phosphatase: 104 [IU]/L (ref 44–121)
BUN/Creatinine Ratio: 28 — ABNORMAL HIGH (ref 9–23)
BUN: 15 mg/dL (ref 6–24)
Bilirubin Total: <0.2 mg/dL (ref 0.0–1.2)
CO2: 22 mmol/L (ref 20–29)
Calcium: 9.8 mg/dL (ref 8.7–10.2)
Chloride: 103 mmol/L (ref 96–106)
Creatinine, Ser: 0.54 mg/dL — ABNORMAL LOW (ref 0.57–1.00)
Globulin, Total: 2.8 g/dL (ref 1.5–4.5)
Glucose: 82 mg/dL (ref 70–99)
Potassium: 4.8 mmol/L (ref 3.5–5.2)
Sodium: 141 mmol/L (ref 134–144)
Total Protein: 7.3 g/dL (ref 6.0–8.5)
eGFR: 106 mL/min/{1.73_m2} (ref >59)

## 2023-06-13 LAB — CBC
Hematocrit: 41 % (ref 34.0–46.6)
Hemoglobin: 13.1 g/dL (ref 11.1–15.9)
MCH: 28.7 pg (ref 26.6–33.0)
MCHC: 32 g/dL (ref 31.5–35.7)
MCV: 90 fL (ref 79–97)
Platelets: 406 10*3/uL (ref 150–450)
RBC: 4.56 x10E6/uL (ref 3.77–5.28)
RDW: 13.7 % (ref 11.7–15.4)
WBC: 4.7 10*3/uL (ref 3.4–10.8)

## 2023-06-13 LAB — VITAMIN D 25 HYDROXY (VIT D DEFICIENCY, FRACTURES): Vit D, 25-Hydroxy: 11.9 ng/mL — ABNORMAL LOW (ref 30.0–100.0)

## 2023-06-13 LAB — LIPID PANEL
Chol/HDL Ratio: 2 {ratio} (ref 0.0–4.4)
Cholesterol, Total: 166 mg/dL (ref 100–199)
HDL: 83 mg/dL (ref >39)
LDL Chol Calc (NIH): 71 mg/dL (ref 0–99)
Triglycerides: 58 mg/dL (ref 0–149)
VLDL Cholesterol Cal: 12 mg/dL (ref 5–40)

## 2023-06-13 MED ORDER — VITAMIN D (ERGOCALCIFEROL) 1.25 MG (50000 UNIT) PO CAPS: 50000.0000 [IU] | ORAL_CAPSULE | ORAL | 1 refills | Status: DC

## 2023-06-26 ENCOUNTER — Other Ambulatory Visit: Payer: Self-pay

## 2023-06-27 ENCOUNTER — Other Ambulatory Visit: Payer: Self-pay

## 2023-07-04 ENCOUNTER — Other Ambulatory Visit: Payer: Self-pay | Admitting: Internal Medicine

## 2023-07-04 DIAGNOSIS — F411 Generalized anxiety disorder: Secondary | ICD-10-CM

## 2023-07-11 ENCOUNTER — Other Ambulatory Visit: Payer: Self-pay

## 2023-07-11 ENCOUNTER — Encounter (HOSPITAL_COMMUNITY): Payer: Self-pay | Admitting: Family

## 2023-07-11 ENCOUNTER — Ambulatory Visit (HOSPITAL_BASED_OUTPATIENT_CLINIC_OR_DEPARTMENT_OTHER): Admitting: Family

## 2023-07-11 VITALS — BP 134/84 | HR 84 | Ht 68.5 in | Wt 164.0 lb

## 2023-07-11 DIAGNOSIS — F321 Major depressive disorder, single episode, moderate: Secondary | ICD-10-CM | POA: Diagnosis not present

## 2023-07-11 DIAGNOSIS — G479 Sleep disorder, unspecified: Secondary | ICD-10-CM

## 2023-07-11 DIAGNOSIS — F411 Generalized anxiety disorder: Secondary | ICD-10-CM | POA: Diagnosis not present

## 2023-07-11 MED ORDER — MIRTAZAPINE 15 MG PO TABS: 15.0000 mg | ORAL_TABLET | Freq: Every day | ORAL | 0 refills | Status: DC
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
## 2023-07-16 ENCOUNTER — Telehealth (HOSPITAL_COMMUNITY): Admitting: Family

## 2023-07-16 DIAGNOSIS — F321 Major depressive disorder, single episode, moderate: Secondary | ICD-10-CM
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
## 2023-07-19 ENCOUNTER — Other Ambulatory Visit: Payer: Self-pay

## 2023-08-06 ENCOUNTER — Other Ambulatory Visit (HOSPITAL_COMMUNITY): Payer: Self-pay | Admitting: Family

## 2023-08-15 ENCOUNTER — Other Ambulatory Visit: Payer: Self-pay | Admitting: Internal Medicine

## 2023-08-15 DIAGNOSIS — M542 Cervicalgia: Secondary | ICD-10-CM

## 2023-08-15 DIAGNOSIS — M25511 Pain in right shoulder: Secondary | ICD-10-CM

## 2023-08-25 ENCOUNTER — Other Ambulatory Visit: Payer: Self-pay | Admitting: Internal Medicine

## 2023-08-25 DIAGNOSIS — M25511 Pain in right shoulder: Secondary | ICD-10-CM

## 2023-08-25 DIAGNOSIS — M542 Cervicalgia: Secondary | ICD-10-CM

## 2023-08-26 ENCOUNTER — Other Ambulatory Visit: Payer: Self-pay | Admitting: Internal Medicine

## 2023-08-26 DIAGNOSIS — D508 Other iron deficiency anemias: Secondary | ICD-10-CM

## 2023-08-28 NOTE — Telephone Encounter (Signed)
 Requested medication (s) are due for refill today: Yes  Requested medication (s) are on the active medication list: Yes  Last refill:    Future visit scheduled: Yes  Notes to clinic:  Not delegated.    Requested Prescriptions  Pending Prescriptions Disp Refills   cyclobenzaprine  (FLEXERIL ) 5 MG tablet [Pharmacy Med Name: CYCLOBENZAPRINE  5 MG TABLET] 30 tablet 1    Sig: TAKE 1 TABLET BY MOUTH DAILY AS NEEDED FOR MUSCLE SPASMS.     Not Delegated - Analgesics:  Muscle Relaxants Failed - 08/28/2023  8:14 AM      Failed - This refill cannot be delegated      Passed - Valid encounter within last 6 months    Recent Outpatient Visits           2 months ago Essential hypertension   [facility] [place] - A Dept Of [place]. [facility] Lawrance Presume, MD   6 months ago Urinary incontinence, mixed   [place] [facility] [place] - A Dept Of [place]. [facility] Lawrance Presume, MD   1 year ago Urinary incontinence, mixed   [place] [facility] - A Dept Of [place]. [facility] Lawrance Presume, MD   1 year ago Essential hypertension   [place] [facility] [place] - A Dept Of [place]. [facility] Valente Gaskin, RPH-CPP   1 year ago Essential hypertension   [place] [facility] [place] - A Dept Of [place]. [facility] Valente Gaskin, RPH-CPP       Future Appointments             In 1 month Lincoln Renshaw, Rexine Cater, MD [facility] [place] - A Dept Of [place]. [facility]

## 2023-10-11 ENCOUNTER — Encounter: Payer: Self-pay | Admitting: Internal Medicine

## 2023-10-11 ENCOUNTER — Ambulatory Visit: Payer: No Typology Code available for payment source | Attending: Internal Medicine | Admitting: Internal Medicine

## 2023-10-11 VITALS — BP 116/78 | HR 79 | Resp 18 | Ht 68.5 in | Wt 155.4 lb

## 2023-10-11 DIAGNOSIS — I10 Essential (primary) hypertension: Secondary | ICD-10-CM

## 2023-10-11 DIAGNOSIS — F4320 Adjustment disorder, unspecified: Secondary | ICD-10-CM

## 2023-10-11 DIAGNOSIS — E559 Vitamin D deficiency, unspecified: Secondary | ICD-10-CM

## 2023-10-11 DIAGNOSIS — F17200 Nicotine dependence, unspecified, uncomplicated: Secondary | ICD-10-CM

## 2023-10-11 DIAGNOSIS — F17210 Nicotine dependence, cigarettes, uncomplicated: Secondary | ICD-10-CM

## 2023-10-11 DIAGNOSIS — N3281 Overactive bladder: Secondary | ICD-10-CM | POA: Diagnosis not present

## 2023-10-11 DIAGNOSIS — Z5181 Encounter for therapeutic drug level monitoring: Secondary | ICD-10-CM

## 2023-10-11 DIAGNOSIS — N951 Menopausal and female climacteric states: Secondary | ICD-10-CM | POA: Diagnosis not present

## 2023-10-11 DIAGNOSIS — E890 Postprocedural hypothyroidism: Secondary | ICD-10-CM

## 2023-10-11 MED ORDER — ALBUTEROL SULFATE HFA 108 (90 BASE) MCG/ACT IN AERS: 2.0000 | INHALATION_SPRAY | Freq: Four times a day (QID) | RESPIRATORY_TRACT | 0 refills | Status: DC | PRN

## 2023-10-11 MED ORDER — VEOZAH 45 MG PO TABS: 45.0000 mg | ORAL_TABLET | Freq: Every day | ORAL | 6 refills | Status: AC
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
## 2023-10-11 NOTE — Patient Instructions (Signed)
 VISIT SUMMARY:  Today, we reviewed your blood pressure, vitamin D  levels, and other health concerns. Your blood pressure is well-controlled with your current medication, and we discussed your vitamin D  supplementation. We also addressed your overactive bladder, menopausal symptoms, depression, anxiety, and smoking habits.  YOUR PLAN:  -OVERACTIVE BLADDER: Overactive bladder means you have a frequent and sudden urge to urinate. Your VESIcare  medication is working well during the day, but nighttime symptoms may be due to fluid intake from hot flashes. We discussed managing your fluid intake, especially at night, and you should continue taking VESIcare  as prescribed.  -MENOPAUSAL SYMPTOMS: Menopausal symptoms like hot flashes are being managed with Veozah , which requires regular liver function monitoring. We will send refills for Veozah  and check your liver function tests.  -DEPRESSION AND ANXIETY: Your depression and anxiety have worsened since your husband's passing. Remeron  helps with sleep and appetite but causes daytime drowsiness. If the drowsiness continues, consider reducing the dose to 7.5 mg. We have removed BuSpar  from your medication list, and you are continuing therapy.  -HYPERTENSION: Hypertension means high blood pressure. Your blood pressure is well-controlled with amlodipine  10 mg daily, and you are doing well with medication and salt restriction. Continue with your current regimen.  -HYPOTHYROIDISM: Hypothyroidism means your thyroid  gland is underactive. You are taking levothyroxine  50 mcg daily. We will check your thyroid  level today to ensure it is well-managed.  -VITAMIN D  DEFICIENCY: Vitamin D  deficiency means you have low levels of vitamin D . You are on high-dose vitamin D  supplementation. We will check your vitamin D  level today and ensure you have a refill for your high-dose vitamin D .  -SMOKING CESSATION: You have increased smoking due to stress. While you are not ready to  quit, we discussed smoking cessation options for when you feel prepared to make that change.  INSTRUCTIONS:  We will check your thyroid  and vitamin D  levels today. Please continue with your current medications and lifestyle adjustments. If you experience any new symptoms or have concerns, schedule a follow-up appointment.

## 2023-10-12 ENCOUNTER — Ambulatory Visit: Payer: Self-pay | Admitting: Internal Medicine

## 2023-10-12 LAB — HEPATIC FUNCTION PANEL
ALT: 9 IU/L (ref 0–32)
AST: 13 IU/L (ref 0–40)
Albumin: 4.4 g/dL (ref 3.8–4.9)
Alkaline Phosphatase: 103 IU/L (ref 44–121)
Bilirubin Total: <0.2 mg/dL (ref 0.0–1.2)
Bilirubin, Direct: <0.08 mg/dL (ref 0.00–0.40)
Total Protein: 7.5 g/dL (ref 6.0–8.5)

## 2023-10-12 LAB — VITAMIN D 25 HYDROXY (VIT D DEFICIENCY, FRACTURES): Vit D, 25-Hydroxy: 13.7 ng/mL — ABNORMAL LOW (ref 30.0–100.0)

## 2023-10-12 LAB — TSH: TSH: 2.03 u[IU]/mL (ref 0.450–4.500)

## 2023-10-17 ENCOUNTER — Encounter: Payer: Self-pay | Admitting: Internal Medicine

## 2023-11-07 ENCOUNTER — Other Ambulatory Visit: Payer: Self-pay | Admitting: Internal Medicine

## 2024-01-16 ENCOUNTER — Ambulatory Visit (INDEPENDENT_AMBULATORY_CARE_PROVIDER_SITE_OTHER): Payer: No Typology Code available for payment source | Admitting: Internal Medicine

## 2024-01-16 ENCOUNTER — Encounter: Payer: Self-pay | Admitting: Internal Medicine

## 2024-01-16 VITALS — BP 110/72 | HR 78 | Ht 68.5 in | Wt 156.0 lb

## 2024-01-16 DIAGNOSIS — E890 Postprocedural hypothyroidism: Secondary | ICD-10-CM

## 2024-01-16 DIAGNOSIS — E0500 Thyrotoxicosis with diffuse goiter without thyrotoxic crisis or storm: Secondary | ICD-10-CM

## 2024-01-16 MED ORDER — LEVOTHYROXINE SODIUM 88 MCG PO TABS: ORAL_TABLET | ORAL | 3 refills | Status: AC

## 2024-01-16 NOTE — Patient Instructions (Signed)

## 2024-01-16 NOTE — Progress Notes (Unsigned)
 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 She has followed with [facility] since 01/09/2019  for consultative assistance with management of her graves' disease     HISTORICAL SUMMARY: The patient was first diagnosed with Graves' disease ~2012  .Over the years she has been prescribed  methimazole  , but would take it on and off due to non-compliance issues , her thyroid  condition has never been optimally controlled.   On her initial visit to our clinic she was not taking methimazole  for ~ 3 months prior to her presentation.   She is S/P RAI ablation on 02/21/2019 with 13.8 mCi I-131 sodium iodide LT- 4 replacement started 04/2019 due to low FT4.   No FH of thyroid  disease  Sister with lupus   SUBJECTIVE:     Today (01/16/2024):  [name] is here for a follow up on hyperthyroidism . She is S/P RAI ablation in 01/2019  Patient has been weight loss over the past year Spouse passed away in Aug 15, 2023 She follows with councilor  No local neck swelling, but has globus sensation  No palpitations  No constipation or diarrhea       Levothyroxine  88 mcg , Half a tablet on Sundays, 1 tablet the rest of the week    HISTORY:  Past Medical History:  Past Medical History:  Diagnosis Date   Anxiety DX 2011   Arthritis    Depression DX 1990   Granulomatous lung disease (HCC)    Grave's disease    Graves Disease   History of radioactive iodine  thyroid  ablation    02/21/2019   Menometrorrhagia    Migraines    Pneumonia    Past Surgical History:  Past Surgical History:  Procedure Laterality Date   ABDOMINAL  HYSTERECTOMY     TOTAL HIP ARTHROPLASTY Right 11/04/2021   Procedure: RIGHT TOTAL HIP ARTHROPLASTY ANTERIOR APPROACH;  Surgeon: Vernetta Lonni CINDERELLA, MD;  Location: [facility];  Service: Orthopedics;  Laterality: Right;   TUBAL LIGATION     Social History:  reports that she has been smoking cigarettes. She has a 16 pack-year smoking history. She has never used smokeless tobacco. She reports current alcohol use. She reports that she does not use drugs. Family History:  Family History  Problem Relation Age of Onset   Hypertension Mother    Heart attack Mother    Hypertension Brother    Hypertension Father    Allergies Father    Allergies Other        children   Breast cancer Maternal Aunt    Breast cancer Maternal Aunt      HOME MEDICATIONS: Allergies as of 01/16/2024   No Known Allergies      Medication List        Accurate as of January 16, 2024  8:23 AM. If you have any questions, ask your nurse or doctor.          albuterol  108 (90 Base) MCG/ACT inhaler Commonly known as: VENTOLIN  HFA INHALE 2 PUFFS INTO THE LUNGS EVERY 6 HOURS FOR UP TO 10 DAYS AS NEEDED FOR WHEEZING OR SHORTNESS OF  BREATH   amLODipine  10 MG tablet Commonly known as: NORVASC  Take 1 tablet (10 mg total) by mouth daily. Please make PCP appointment for more refills.   aspirin  81 MG chewable tablet Chew 1 tablet (81 mg total) by mouth 2 (two) times daily.   cyclobenzaprine  5 MG tablet Commonly known as: FLEXERIL  TAKE 1 TABLET BY MOUTH DAILY AS NEEDED FOR MUSCLE SPASMS.   ferrous sulfate  325 (65 FE) MG tablet Take 1 tablet (325 mg total) by mouth daily with breakfast.   levothyroxine  88 MCG tablet Commonly known as: SYNTHROID  Half a tablet on Sundays, 1 tablet Monday through Saturday   meloxicam  15 MG tablet Commonly known as: MOBIC  TAKE 1 TABLET (15 MG TOTAL) BY MOUTH DAILY.   mirtazapine  15 MG tablet Commonly known as: Remeron  Take 1 tablet (15 mg total) by mouth at bedtime.   nicotine   21 mg/24hr patch Commonly known as: NICODERM CQ  - dosed in mg/24 hours Place 1 patch (21 mg total) onto the skin daily.   solifenacin  10 MG tablet Commonly known as: VESICARE  Take 1 tablet (10 mg total) by mouth daily.   Veozah  45 MG Tabs Generic drug: Fezolinetant  Take 1 tablet (45 mg total) by mouth daily.   Vitamin D  (Ergocalciferol ) 1.25 MG (50000 UNIT) Caps capsule Commonly known as: DRISDOL  Take 1 capsule (50,000 Units total) by mouth every 7 (seven) days.          OBJECTIVE:   PHYSICAL EXAM: VS: BP 110/72 (BP Location: Left Arm, Patient Position: Sitting, Cuff Size: Normal)   Pulse 78   Ht 5' 8.5 (1.74 m)   Wt 156 lb (70.8 kg)   LMP 04/23/2011   SpO2 97%   BMI 23.37 kg/m    EXAM: General: Pt appears well and is in NAD   Neck: General: Supple without adenopathy. Thyroid : Thyroid  size normal.  No goiter  appreciated.  Lungs: Clear with good BS bilat   Heart: Auscultation: RRR.  Abdomen: soft, nontender  Extremities:  BL LE: No pretibial edema normal   Mental Status: Judgment, insight: Intact Orientation: Oriented to time, place, and person Mood and affect: No depression, anxiety, or agitation     DATA REVIEWED:  Latest Reference Range & Units 10/11/23 09:55  TSH 0.450 - 4.500 uIU/mL 2.030      ASSESSMENT / PLAN / RECOMMENDATIONS:   Postablative Hypothyroidism  - She is S/P RAI Ablation on 02/21/2019 with 13.8 mCi I-131 sodium iodide - She is clinically euthyroid  -TFTs remain within normal range, no change  Medications   Continue  Levothyroxine  88 mcg , Half a tablet on Sundays, 1 tablet the rest of the week   2. Graves' Disease:   -She was referred to Dr. Abbey office, patient will reschedule appointment    F/U in 1 year    Signed electronically by: Stefano Redgie Butts, MD  [facility] Endocrinology  [facility] [address] [place] [place], [place] [address] Phone: [phone] FAX: [phone]       CC: Vicci Barnie NOVAK, MD [address] [place] [address] Phone: [phone]  Fax: [phone]   Return to Endocrinology clinic as below: Future Appointments  Date Time Provider Department Center  02/11/2024  9:10 AM Vicci Barnie NOVAK, MD [id number]

## 2024-02-04 ENCOUNTER — Encounter: Payer: Self-pay | Admitting: Internal Medicine

## 2024-02-11 ENCOUNTER — Ambulatory Visit: Admitting: Internal Medicine

## 2024-02-11 NOTE — Progress Notes (Deleted)
 error

## 2024-02-12 ENCOUNTER — Other Ambulatory Visit: Payer: Self-pay | Admitting: Internal Medicine

## 2024-03-10 ENCOUNTER — Telehealth: Payer: Self-pay | Admitting: Internal Medicine

## 2024-03-10 NOTE — Telephone Encounter (Signed)
 Confirmed appt for 11/11

## 2024-03-11 ENCOUNTER — Ambulatory Visit: Admitting: Internal Medicine

## 2024-05-06 ENCOUNTER — Encounter: Payer: Self-pay | Admitting: Internal Medicine

## 2024-05-06 ENCOUNTER — Ambulatory Visit: Attending: Family Medicine | Admitting: Internal Medicine

## 2024-05-06 VITALS — BP 152/89 | HR 71 | Ht 68.0 in | Wt 160.0 lb

## 2024-05-06 DIAGNOSIS — H1013 Acute atopic conjunctivitis, bilateral: Secondary | ICD-10-CM

## 2024-05-06 DIAGNOSIS — Z23 Encounter for immunization: Secondary | ICD-10-CM | POA: Diagnosis not present

## 2024-05-06 DIAGNOSIS — E890 Postprocedural hypothyroidism: Secondary | ICD-10-CM

## 2024-05-06 DIAGNOSIS — R232 Flushing: Secondary | ICD-10-CM | POA: Diagnosis not present

## 2024-05-06 DIAGNOSIS — Z79899 Other long term (current) drug therapy: Secondary | ICD-10-CM | POA: Insufficient documentation

## 2024-05-06 DIAGNOSIS — F330 Major depressive disorder, recurrent, mild: Secondary | ICD-10-CM

## 2024-05-06 DIAGNOSIS — J438 Other emphysema: Secondary | ICD-10-CM | POA: Diagnosis not present

## 2024-05-06 DIAGNOSIS — R32 Unspecified urinary incontinence: Secondary | ICD-10-CM | POA: Diagnosis not present

## 2024-05-06 DIAGNOSIS — Z59868 Other specified financial insecurity: Secondary | ICD-10-CM | POA: Diagnosis not present

## 2024-05-06 DIAGNOSIS — Z79890 Hormone replacement therapy (postmenopausal): Secondary | ICD-10-CM | POA: Insufficient documentation

## 2024-05-06 DIAGNOSIS — Z7982 Long term (current) use of aspirin: Secondary | ICD-10-CM | POA: Diagnosis not present

## 2024-05-06 DIAGNOSIS — E559 Vitamin D deficiency, unspecified: Secondary | ICD-10-CM | POA: Diagnosis not present

## 2024-05-06 DIAGNOSIS — Z716 Tobacco abuse counseling: Secondary | ICD-10-CM | POA: Insufficient documentation

## 2024-05-06 DIAGNOSIS — H538 Other visual disturbances: Secondary | ICD-10-CM | POA: Diagnosis not present

## 2024-05-06 DIAGNOSIS — Z791 Long term (current) use of non-steroidal anti-inflammatories (NSAID): Secondary | ICD-10-CM | POA: Diagnosis not present

## 2024-05-06 DIAGNOSIS — L299 Pruritus, unspecified: Secondary | ICD-10-CM | POA: Insufficient documentation

## 2024-05-06 DIAGNOSIS — N3281 Overactive bladder: Secondary | ICD-10-CM | POA: Diagnosis not present

## 2024-05-06 DIAGNOSIS — F17200 Nicotine dependence, unspecified, uncomplicated: Secondary | ICD-10-CM

## 2024-05-06 DIAGNOSIS — N3946 Mixed incontinence: Secondary | ICD-10-CM

## 2024-05-06 DIAGNOSIS — I10 Essential (primary) hypertension: Secondary | ICD-10-CM

## 2024-05-06 DIAGNOSIS — F17210 Nicotine dependence, cigarettes, uncomplicated: Secondary | ICD-10-CM | POA: Diagnosis not present

## 2024-05-06 DIAGNOSIS — Z760 Encounter for issue of repeat prescription: Secondary | ICD-10-CM | POA: Insufficient documentation

## 2024-05-06 DIAGNOSIS — R7303 Prediabetes: Secondary | ICD-10-CM | POA: Insufficient documentation

## 2024-05-06 DIAGNOSIS — N951 Menopausal and female climacteric states: Secondary | ICD-10-CM | POA: Insufficient documentation

## 2024-05-06 LAB — GLUCOSE, POCT (MANUAL RESULT ENTRY): POC Glucose: 84 mg/dL (ref 70–99)

## 2024-05-06 LAB — POCT GLYCOSYLATED HEMOGLOBIN (HGB A1C): HbA1c, POC (prediabetic range): 5.9 % (ref 5.7–6.4)

## 2024-05-06 MED ORDER — SOLIFENACIN SUCCINATE 10 MG PO TABS: 10.0000 mg | ORAL_TABLET | Freq: Every day | ORAL | 1 refills | Status: AC

## 2024-05-06 MED ORDER — VITAMIN D (ERGOCALCIFEROL) 1.25 MG (50000 UNIT) PO CAPS: 50000.0000 [IU] | ORAL_CAPSULE | ORAL | 1 refills | Status: AC

## 2024-05-06 MED ORDER — NICOTINE 14 MG/24HR TD PT24: 14.0000 mg | MEDICATED_PATCH | Freq: Every day | TRANSDERMAL | 2 refills | Status: AC

## 2024-05-06 MED ORDER — MIRTAZAPINE 7.5 MG PO TABS: 7.5000 mg | ORAL_TABLET | Freq: Every day | ORAL | 1 refills | Status: AC

## 2024-05-06 MED ORDER — AMLODIPINE BESYLATE 10 MG PO TABS: 10.0000 mg | ORAL_TABLET | Freq: Every day | ORAL | 2 refills | Status: AC

## 2024-05-06 MED ORDER — OLOPATADINE HCL 0.1 % OP SOLN: 1.0000 [drp] | Freq: Two times a day (BID) | OPHTHALMIC | 1 refills | Status: AC | PRN

## 2024-05-06 NOTE — Patient Instructions (Signed)
" °  VISIT SUMMARY: Today, you came in for your six-month follow-up appointment. We discussed your hypertension, prediabetes, mood, smoking cessation, vitamin D  deficiency, hypothyroidism, eye discomfort, and general health maintenance. We made some adjustments to your medications and provided recommendations to help manage your conditions.  YOUR PLAN: -ESSENTIAL HYPERTENSION: Your blood pressure has been elevated since you ran out of amlodipine . Hypertension means your blood pressure is higher than normal, which can lead to health problems if not managed. We have sent a refill for your amlodipine  and advised you to continue limiting your salt intake.  -PREDIABETES: Your A1c level indicates prediabetes, which means your blood sugar levels are higher than normal but not high enough to be classified as diabetes. We recommend avoiding sugary drinks and snacks and increasing your water  intake.  -MAJOR DEPRESSIVE DISORDER, RECURRENT EPISODE, MILD: You have been experiencing more bad days, especially as the anniversary of your husband's death approaches. Depression is a mood disorder that causes persistent feelings of sadness and loss of interest. We have prescribed Remeron  to help with your mood and submitted a referral for therapy.  -TOBACCO USE DISORDER: You are working on quitting smoking, which is great progress. Tobacco use disorder means you have a dependence on tobacco. We have prescribed nicotine  patches to help you quit and advised you to call 1-800-QUIT-NOW for additional support.  -VITAMIN D  DEFICIENCY: You have a vitamin D  deficiency, which means you have lower than normal levels of vitamin D . We have rechecked your vitamin D  levels and sent a refill for your high-dose vitamin D .  -HYPOTHYROIDISM: Your thyroid  is underactive, which means it doesn't produce enough thyroid  hormone. You are continuing your current dose of levothyroxine , and we will recheck your thyroid  levels during your next blood  work.  -ALLERGIC CONJUNCTIVITIS: You have been experiencing puffiness, itching, and lacrimation around your eyes, likely due to allergies. Allergic conjunctivitis is an eye inflammation caused by an allergic reaction. We have prescribed allergy  eye drops and advised using warm and cold compresses. We also referred you to an eye doctor.  INSTRUCTIONS: Please follow up with the behavioral health referral for therapy. Continue monitoring your blood pressure every two days and keep reducing your salt intake. Use the prescribed medications as directed and call 1-800-QUIT-NOW if you need additional support for quitting smoking. Schedule an appointment with an eye doctor for your eye discomfort. We will recheck your vitamin D  and thyroid  levels during your next blood work. If you have any questions or concerns, please contact our office.                      Contains text generated by [employer].                                 Contains text generated by [employer]bridge.   "
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
## 2024-05-07 ENCOUNTER — Ambulatory Visit: Payer: Self-pay | Admitting: Internal Medicine

## 2024-05-07 LAB — CBC
Hematocrit: 40.3 % (ref 34.0–46.6)
Hemoglobin: 13 g/dL (ref 11.1–15.9)
MCH: 29 pg (ref 26.6–33.0)
MCHC: 32.3 g/dL (ref 31.5–35.7)
MCV: 90 fL (ref 79–97)
Platelets: 392 x10E3/uL (ref 150–450)
RBC: 4.48 x10E6/uL (ref 3.77–5.28)
RDW: 12.8 % (ref 11.7–15.4)
WBC: 5.9 x10E3/uL (ref 3.4–10.8)

## 2024-05-07 LAB — COMPREHENSIVE METABOLIC PANEL WITH GFR
ALT: 10 IU/L (ref 0–32)
AST: 11 IU/L (ref 0–40)
Albumin: 4.3 g/dL (ref 3.8–4.9)
Alkaline Phosphatase: 89 IU/L (ref 49–135)
BUN/Creatinine Ratio: 28 (ref 12–28)
BUN: 15 mg/dL (ref 8–27)
Bilirubin Total: <0.2 mg/dL (ref 0.0–1.2)
CO2: 22 mmol/L (ref 20–29)
Calcium: 9.5 mg/dL (ref 8.7–10.3)
Chloride: 102 mmol/L (ref 96–106)
Creatinine, Ser: 0.54 mg/dL — ABNORMAL LOW (ref 0.57–1.00)
Globulin, Total: 3 g/dL (ref 1.5–4.5)
Glucose: 78 mg/dL (ref 70–99)
Potassium: 5 mmol/L (ref 3.5–5.2)
Sodium: 138 mmol/L (ref 134–144)
Total Protein: 7.3 g/dL (ref 6.0–8.5)
eGFR: 105 mL/min/1.73

## 2024-05-07 LAB — VITAMIN D 25 HYDROXY (VIT D DEFICIENCY, FRACTURES): Vit D, 25-Hydroxy: 39 ng/mL (ref 30.0–100.0)

## 2024-05-07 LAB — LIPID PANEL
Chol/HDL Ratio: 2 ratio (ref 0.0–4.4)
Cholesterol, Total: 172 mg/dL (ref 100–199)
HDL: 84 mg/dL
LDL Chol Calc (NIH): 79 mg/dL (ref 0–99)
Triglycerides: 44 mg/dL (ref 0–149)
VLDL Cholesterol Cal: 9 mg/dL (ref 5–40)

## 2024-05-07 LAB — TSH: TSH: 3.92 u[IU]/mL (ref 0.450–4.500)

## 2024-05-13 ENCOUNTER — Ambulatory Visit: Admitting: Internal Medicine

## 2024-05-29 ENCOUNTER — Encounter: Payer: Self-pay | Admitting: Physician Assistant

## 2024-05-29 ENCOUNTER — Ambulatory Visit: Admitting: Sports Medicine

## 2024-05-29 ENCOUNTER — Ambulatory Visit (INDEPENDENT_AMBULATORY_CARE_PROVIDER_SITE_OTHER): Payer: Self-pay

## 2024-05-29 ENCOUNTER — Other Ambulatory Visit: Payer: Self-pay

## 2024-05-29 ENCOUNTER — Ambulatory Visit: Admitting: Physician Assistant

## 2024-05-29 DIAGNOSIS — M545 Low back pain, unspecified: Secondary | ICD-10-CM | POA: Diagnosis not present

## 2024-05-29 DIAGNOSIS — M25552 Pain in left hip: Secondary | ICD-10-CM

## 2024-05-29 DIAGNOSIS — M25511 Pain in right shoulder: Secondary | ICD-10-CM

## 2024-05-29 DIAGNOSIS — Z96641 Presence of right artificial hip joint: Secondary | ICD-10-CM | POA: Diagnosis not present

## 2024-05-29 DIAGNOSIS — M1612 Unilateral primary osteoarthritis, left hip: Secondary | ICD-10-CM | POA: Diagnosis not present

## 2024-05-29 DIAGNOSIS — M25512 Pain in left shoulder: Secondary | ICD-10-CM

## 2024-05-29 DIAGNOSIS — M542 Cervicalgia: Secondary | ICD-10-CM

## 2024-05-29 MED ORDER — LIDOCAINE HCL 1 % IJ SOLN
4.0000 mL | INTRAMUSCULAR | Status: AC | PRN
  Administered 2024-05-29: 4 mL

## 2024-05-29 MED ORDER — METHYLPREDNISOLONE ACETATE 40 MG/ML IJ SUSP
80.0000 mg | INTRAMUSCULAR | Status: AC | PRN
  Administered 2024-05-29: 80 mg via INTRA_ARTICULAR

## 2024-05-29 MED ORDER — HYDROCODONE-ACETAMINOPHEN 5-325 MG PO TABS: 1.0000 | ORAL_TABLET | Freq: Four times a day (QID) | ORAL | 0 refills | Status: AC | PRN

## 2024-05-29 MED ORDER — CYCLOBENZAPRINE HCL 5 MG PO TABS: 5.0000 mg | ORAL_TABLET | Freq: Three times a day (TID) | ORAL | 1 refills | Status: AC | PRN

## 2024-05-29 NOTE — Progress Notes (Signed)
 "  Office Visit Note   Patient: [name]           Date of Birth: 04-25-1966           MRN: [id number] Visit Date: 05/29/2024              Requested by: Vicci Barnie NOVAK, MD [address] [place] [address] [place],  [place] [address] PCP: Vicci Barnie NOVAK, MD   Assessment & Plan: Visit Diagnoses:  1. History of right hip replacement   2. Pain in left hip   3. Low back pain, unspecified back pain laterality, unspecified chronicity, unspecified whether sciatica present     Plan: Patient is a pleasant 59 year old woman who is a patient of Dr. Damian.  She is status post right hip replacement a few years ago.  She has had increasing left hip pain over the last several months.  She says it feels very similar to her right hip prior to the replacement though not quite as severe.  She probably has an element of back issues but I believe that for the most part it is her hip that is bothering her the most.  She has trouble crossing her legs reproduces pain in her groin that radiates down to her knee.  Neurologically she has got great strength 5 out of 5.  I discussed trying an intra-articular injection today into the left hip this could be both diagnostic as well as therapeutic.  She had 1 similar prior to this in her right hip before the replacement and it did help but only for a few weeks.  I am hopeful that because this hip is not as bad as the right one got that this can be helpful for her if this does not give her significant relief would refer her back to Dr. Vernetta.  I did call her in some Flexeril  which helped her in the past and a small amount of hydrocodone .  May follow-up with me as needed if  Follow-Up Instructions: No follow-ups on file.  Orders:  Orders Placed This Encounter  Procedures   XR HIP UNILAT W OR W/O PELVIS 2-3 VIEWS LEFT   XR Lumbar Spine 2-3 Views   No orders of the defined types were placed in this encounter.     Procedures: No procedures  performed   Clinical Data: No additional findings.   Subjective: No chief complaint on file.   HPI patient is a pleasant 59 year old woman who is a patient of Dr. Damian.  She is status post right hip replacement a couple years ago.  She comes in today with a chief complaint of left left hip total knee pain.  She feels like she gets muscle spasms.  Sometimes it starts in the knee sometimes it starts in the hip she describes it constant burning and throbbing having difficulty sleep at night she has not done any PT  Review of Systems  All other systems reviewed and are negative.    Objective: Vital Signs: LMP 04/23/2011   Physical Exam Constitutional:      Appearance: Normal appearance.  Pulmonary:     Effort: Pulmonary effort is normal.     Breath sounds: Normal breath sounds.  Skin:    General: Skin is warm and dry.  Neurological:     General: No focal deficit present.     Mental Status: She is alert and oriented to person, place, and time.  Psychiatric:        Mood and  Affect: Mood normal.        Behavior: Behavior normal.     Ortho Exam Examination of her left hip she has an antalgic gait secondary to pain.  She has pain in the groin and lateral hip especially with external/internal rotation.  She has pain when she tries to bend her leg up.  She has got strong distal pulses compartments are soft and nontender.  She has 5 out of 5 strength with resisted dorsiflexion plantarflexion of her ankles extension and flexion of her legs Specialty Comments:  No specialty comments available.  Imaging: No results found.   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   Graves disease 04/10/2019   Prediabetes 10/18/2018   Tinea pedis 07/31/2014   Screening for HIV (human immunodeficiency virus) 07/31/2014   Cramp of limb 01/13/2011   Sarcoidosis of lung 12/30/2010   Rhinitis, allergic 11/29/2010   Urticaria, idiopathic 10/27/2010   COPD, mild (HCC) 08/06/2010   Graves' disease 06/03/2010   MIGRAINE HEADACHE 11/12/2008   ROTATOR CUFF TEAR 11/12/2008   DEPRESSION, RECURRENT 05/12/2008   ANXIETY DISORDER, GENERALIZED 02/05/2007   TOBACCO ABUSE 02/05/2007   Past Medical History:  Diagnosis Date   Anxiety DX 2011   Arthritis    Depression DX 1990   Granulomatous lung disease (HCC)    Grave's disease    Graves Disease   History of radioactive iodine  thyroid  ablation    02/21/2019   Menometrorrhagia    Migraines    Pneumonia     Family History  Problem Relation Age of Onset   Hypertension Mother    Heart attack Mother    Hypertension Brother    Hypertension Father    Allergies Father    Allergies Other        children   Breast cancer Maternal Aunt    Breast cancer Maternal Aunt     Past Surgical History:  Procedure Laterality Date   ABDOMINAL HYSTERECTOMY     TOTAL HIP ARTHROPLASTY Right 11/04/2021   Procedure: RIGHT TOTAL HIP ARTHROPLASTY ANTERIOR APPROACH;  Surgeon: Vernetta Lonni GRADE, MD;  Location: [facility];  Service: Orthopedics;  Laterality: Right;   TUBAL LIGATION     Social History   Occupational History   Occupation: paper company    Comment: third shift  Tobacco Use   Smoking status: Some Days    Current packs/day: 0.50    Average packs/day: 0.5 packs/day for 32.0 years (16.0 ttl pk-yrs)    Types: Cigarettes   Smokeless tobacco: Never  Vaping Use   Vaping status: Never Used  Substance and Sexual Activity   Alcohol use: Yes    Comment: occasional beer   Drug use: No   Sexual  activity: Not Currently        "

## 2024-05-29 NOTE — Progress Notes (Signed)
" ° °  Procedure Note  Patient: [name]             Date of Birth: 1965/11/03           MRN: [id number]             Visit Date: 05/29/2024  Procedures: Visit Diagnoses:  1. Pain in left hip   2. Unilateral primary osteoarthritis, left hip    Lab Results  Component Value Date   HGBA1C 5.9 05/06/2024   Large Joint Inj: L hip joint on 05/29/2024 1:55 PM Indications: pain Details: 22 G 3.5 in needle, ultrasound-guided anterior approach Medications: 4 mL lidocaine  1 %; 80 mg methylPREDNISolone  acetate 40 MG/ML Outcome: tolerated well, no immediate complications  Procedure: US -guided intra-articular hip injection, Left After discussion on risks/benefits/indications and informed verbal consent was obtained, a timeout was performed. Patient was lying supine on exam table. The hip was cleaned with betadine  and alcohol swabs . Then utilizing ultrasound guidance, the patient's femoral head and neck junction was identified and subsequently injected with 4:2 lidocaine :depomedrol via an in-plane approach with ultrasound visualization of the injectate administered into the hip joint. Patient tolerated procedure well without immediate complications.  Procedure, treatment alternatives, risks and benefits explained, specific risks discussed. Consent was given by the patient. Immediately prior to procedure a time out was called to verify the correct patient, procedure, equipment, support staff and site/side marked as required. Patient was prepped and draped in the usual sterile fashion.     - patient tolerated procedure well, discussed post-injection protocol - follow-up with Mary-Anne Persons as indicated; I am happy to see them as needed  Lonell Sprang, DO Primary Care Sports Medicine Physician   [facility]  This note was dictated using Dragon naturally speaking software and may contain errors in syntax, spelling, or content which have not been identified prior to signing  this note.      "

## 2024-09-08 ENCOUNTER — Ambulatory Visit: Payer: Self-pay | Admitting: Internal Medicine

## 2025-01-15 ENCOUNTER — Ambulatory Visit: Admitting: Internal Medicine
